# Patient Record
Sex: Female | Born: 1970 | Race: Black or African American | Hispanic: No | Marital: Single | Smoking: Never smoker
Health system: Southern US, Community
[De-identification: ages and names within clinical notes are randomized; demographics above are authoritative.]

## PROBLEM LIST (undated history)

## (undated) DIAGNOSIS — G709 Myoneural disorder, unspecified: Secondary | ICD-10-CM

## (undated) DIAGNOSIS — K760 Fatty (change of) liver, not elsewhere classified: Secondary | ICD-10-CM

## (undated) DIAGNOSIS — M25519 Pain in unspecified shoulder: Secondary | ICD-10-CM

## (undated) DIAGNOSIS — M797 Fibromyalgia: Secondary | ICD-10-CM

## (undated) DIAGNOSIS — R600 Localized edema: Secondary | ICD-10-CM

## (undated) DIAGNOSIS — M2550 Pain in unspecified joint: Secondary | ICD-10-CM

## (undated) DIAGNOSIS — G43909 Migraine, unspecified, not intractable, without status migrainosus: Secondary | ICD-10-CM

## (undated) DIAGNOSIS — M549 Dorsalgia, unspecified: Secondary | ICD-10-CM

## (undated) DIAGNOSIS — Z8669 Personal history of other diseases of the nervous system and sense organs: Secondary | ICD-10-CM

## (undated) DIAGNOSIS — M7500 Adhesive capsulitis of unspecified shoulder: Secondary | ICD-10-CM

## (undated) HISTORY — DX: Migraine, unspecified, not intractable, without status migrainosus: G43.909

## (undated) HISTORY — DX: Fibromyalgia: M79.7

## (undated) HISTORY — PX: LAPAROSCOPIC ENDOMETRIOSIS FULGURATION: SUR769

## (undated) HISTORY — DX: Pain in unspecified shoulder: M25.519

## (undated) HISTORY — PX: WISDOM TOOTH EXTRACTION: SHX21

## (undated) HISTORY — DX: Dorsalgia, unspecified: M54.9

## (undated) HISTORY — DX: Personal history of other diseases of the nervous system and sense organs: Z86.69

## (undated) HISTORY — PX: POLYPECTOMY: SHX149

## (undated) HISTORY — PX: ABLATION: SHX5711

## (undated) HISTORY — DX: Pain in unspecified joint: M25.50

## (undated) HISTORY — DX: Adhesive capsulitis of unspecified shoulder: M75.00

## (undated) HISTORY — DX: Myoneural disorder, unspecified: G70.9

## (undated) HISTORY — DX: Localized edema: R60.0

---

## 1999-03-28 ENCOUNTER — Other Ambulatory Visit: Admission: RE | Admit: 1999-03-28 | Discharge: 1999-03-28 | Payer: Self-pay | Admitting: *Deleted

## 2000-04-05 ENCOUNTER — Other Ambulatory Visit: Admission: RE | Admit: 2000-04-05 | Discharge: 2000-04-05 | Payer: Self-pay | Admitting: Family Medicine

## 2000-04-05 ENCOUNTER — Other Ambulatory Visit: Admission: RE | Admit: 2000-04-05 | Discharge: 2000-04-05 | Payer: Self-pay | Admitting: *Deleted

## 2001-04-07 ENCOUNTER — Other Ambulatory Visit: Admission: RE | Admit: 2001-04-07 | Discharge: 2001-04-07 | Payer: Self-pay | Admitting: Family Medicine

## 2001-11-20 ENCOUNTER — Other Ambulatory Visit: Admission: RE | Admit: 2001-11-20 | Discharge: 2001-11-20 | Payer: Self-pay | Admitting: Family Medicine

## 2006-03-21 ENCOUNTER — Ambulatory Visit (HOSPITAL_BASED_OUTPATIENT_CLINIC_OR_DEPARTMENT_OTHER): Admission: RE | Admit: 2006-03-21 | Discharge: 2006-03-21 | Payer: Self-pay | Admitting: General Surgery

## 2011-09-26 ENCOUNTER — Encounter (HOSPITAL_COMMUNITY): Payer: Self-pay | Admitting: *Deleted

## 2011-09-26 ENCOUNTER — Emergency Department (HOSPITAL_COMMUNITY): Payer: 59

## 2011-09-26 ENCOUNTER — Emergency Department (HOSPITAL_COMMUNITY)
Admission: EM | Admit: 2011-09-26 | Discharge: 2011-09-27 | Disposition: A | Discharge status: Home / Self Care | Payer: 59 | Attending: Emergency Medicine | Admitting: Emergency Medicine

## 2011-09-26 DIAGNOSIS — R0602 Shortness of breath: Secondary | ICD-10-CM | POA: Insufficient documentation

## 2011-09-26 DIAGNOSIS — R51 Headache: Secondary | ICD-10-CM | POA: Insufficient documentation

## 2011-09-26 DIAGNOSIS — R000 Tachycardia, unspecified: Secondary | ICD-10-CM | POA: Insufficient documentation

## 2011-09-26 DIAGNOSIS — E119 Type 2 diabetes mellitus without complications: Secondary | ICD-10-CM | POA: Insufficient documentation

## 2011-09-26 DIAGNOSIS — R110 Nausea: Secondary | ICD-10-CM | POA: Insufficient documentation

## 2011-09-26 DIAGNOSIS — R079 Chest pain, unspecified: Secondary | ICD-10-CM | POA: Insufficient documentation

## 2011-09-26 DIAGNOSIS — M546 Pain in thoracic spine: Secondary | ICD-10-CM | POA: Insufficient documentation

## 2011-09-26 DIAGNOSIS — R059 Cough, unspecified: Secondary | ICD-10-CM | POA: Insufficient documentation

## 2011-09-26 DIAGNOSIS — R5381 Other malaise: Secondary | ICD-10-CM | POA: Insufficient documentation

## 2011-09-26 DIAGNOSIS — Z79899 Other long term (current) drug therapy: Secondary | ICD-10-CM | POA: Insufficient documentation

## 2011-09-26 DIAGNOSIS — R05 Cough: Secondary | ICD-10-CM | POA: Insufficient documentation

## 2011-09-26 DIAGNOSIS — IMO0001 Reserved for inherently not codable concepts without codable children: Secondary | ICD-10-CM | POA: Insufficient documentation

## 2011-09-26 LAB — BASIC METABOLIC PANEL
BUN: 12 mg/dL (ref 6–23)
Calcium: 10 mg/dL (ref 8.4–10.5)
Creatinine, Ser: 0.48 mg/dL — ABNORMAL LOW (ref 0.50–1.10)
GFR calc non Af Amer: >90 mL/min (ref >90)
Glucose, Bld: 183 mg/dL — ABNORMAL HIGH (ref 70–99)
Sodium: 136 mEq/L (ref 135–145)

## 2011-09-26 LAB — DIFFERENTIAL
Eosinophils Absolute: 0.3 10*3/uL (ref 0.0–0.7)
Eosinophils Relative: 2 % (ref 0–5)
Lymphs Abs: 4.6 10*3/uL — ABNORMAL HIGH (ref 0.7–4.0)
Monocytes Absolute: 0.8 10*3/uL (ref 0.1–1.0)
Monocytes Relative: 5 % (ref 3–12)

## 2011-09-26 LAB — CBC
HCT: 32.8 % — ABNORMAL LOW (ref 36.0–46.0)
MCH: 21.9 pg — ABNORMAL LOW (ref 26.0–34.0)
MCV: 69.8 fL — ABNORMAL LOW (ref 78.0–100.0)
Platelets: 499 10*3/uL — ABNORMAL HIGH (ref 150–400)
RBC: 4.7 MIL/uL (ref 3.87–5.11)

## 2011-09-26 MED ORDER — ACETAMINOPHEN 325 MG PO TABS
975.0000 mg | ORAL_TABLET | Freq: Once | ORAL | Status: AC
  Administered 2011-09-27: 975 mg via ORAL
  Filled 2011-09-26: qty 3

## 2011-09-26 NOTE — ED Notes (Signed)
Pt stated that she has fatigue and weak since Friday. She stated that it became worse today. Today at work she begin having left sided CP around 2030. Pain is intermittent last 5-10 seconds. She has had a total of 5 episodes tonight. She also complaining of back pain with since CP. Pt stated that she has been having night sweats since Sunday. Pt has also been nauseated. No vomiting. No SOB. Will continue to monitor.

## 2011-09-26 NOTE — ED Notes (Signed)
Patient reports she has not felt good all day.  She denies any numbness. She has chest pain.  She has headache and states her hands feel numb.  Patient also reports she is having cough at night and night sweats.  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  She does not have results of her tests.  Mask placed on patient

## 2011-09-26 NOTE — ED Provider Notes (Signed)
Pt has had general malaise today.  She also has had chest pain lasting a few minutes at a time.  Not exertional.  No dyspnea on exertion either.  Denies leg pain or swelling.   Will check labs, cxr and reassess.  Low suspicion for ACE, PE, dissection.  I saw and evaluated the patient, reviewed the resident's note and I agree with the findings and plan.   Celene Kras, MD 09/26/11 [phone]

## 2011-09-26 NOTE — ED Provider Notes (Signed)
History     CSN: [id number]  Arrival date & time 09/26/11  2117   First MD Initiated Contact with Patient 09/26/11 2216      Chief Complaint  Patient presents with  . Chest Pain  . Weakness  . Night Sweats    (Consider location/radiation/quality/duration/timing/severity/associated sxs/prior treatment) Patient is a 41 y.o. female presenting with neurologic complaint and chest pain. The history is provided by the patient.  Neurologic Problem The primary symptoms include headaches and nausea. Primary symptoms do not include vomiting. Primary symptoms comment: C/C: General weakness The symptoms began 3 to 5 days ago. The symptoms are unchanged. The neurological symptoms are diffuse.  The headache is associated with weakness.  Additional symptoms include weakness. Additional symptoms do not include lower back pain. Medical issues also include diabetes. Medical issues do not include cerebral vascular accident, cancer, hypertension or recent surgery.  Chest Pain The chest pain began 6 - 12 hours ago. Chest pain occurs rarely. The chest pain is unchanged. Associated with: comes on at rest, goes away on its own. The pain is currently at 0/10. The severity of the pain is mild. The quality of the pain is described as sharp. The pain radiates to the upper back. Exacerbated by: nothing. Primary symptoms include shortness of breath, cough and nausea. Pertinent negatives for primary symptoms include no fatigue, no abdominal pain and no vomiting. Primary symptoms comment: C/C: General weakness  Associated symptoms include weakness. She tried nothing for the symptoms. Risk factors: DM.  Her past medical history is significant for diabetes.  Pertinent negatives for past medical history include no cancer, no hyperlipidemia, no hypertension, no MI and no PE.  Her family medical history is significant for CAD in family.     Past Medical History  Diagnosis Date  . Diabetes mellitus     Past Surgical  History  Procedure Date  . Cesarean section   . Laparoscopic endometriosis fulguration     No family history on file.  History  Substance Use Topics  . Smoking status: Never Smoker   . Smokeless tobacco: Not on file  . Alcohol Use: No    OB History    Grav Para Term Preterm Abortions TAB SAB Ect Mult Living                  Review of Systems  Constitutional: Negative for fatigue.  HENT: Negative for neck pain.   Respiratory: Positive for cough and shortness of breath. Negative for chest tightness.   Cardiovascular: Positive for chest pain.  Gastrointestinal: Positive for nausea. Negative for vomiting, abdominal pain and diarrhea.  Genitourinary: Negative for dysuria.  Musculoskeletal: Positive for myalgias.  Skin: Negative for rash.  Neurological: Positive for weakness and headaches.  All other systems reviewed and are negative.    Allergies  Review of patient's allergies indicates no known allergies.  Home Medications   Current Outpatient Rx  Name Route Sig Dispense Refill  . GLIPIZIDE 10 MG PO TABS Oral Take 10 mg by mouth 2 (two) times daily before a meal.    . METFORMIN HCL 1000 MG PO TABS Oral Take 1,000 mg by mouth 2 (two) times daily with a meal.    . ADULT MULTIVITAMIN W/MINERALS CH Oral Take 1 tablet by mouth daily.      BP 153/105  Pulse 113  Temp(Src) 97.3 F (36.3 C) (Oral)  Resp 18  Ht 5' 1.5" (1.562 m)  Wt 185 lb (83.915 kg)  BMI 34.39 kg/m2  SpO2 100%  Physical Exam  Nursing note and vitals reviewed. Constitutional: She is oriented to person, place, and time. She appears well-developed and well-nourished.  HENT:  Head: Normocephalic and atraumatic.  Eyes: EOM are normal. Pupils are equal, round, and reactive to light.  Neck: Normal range of motion.  Cardiovascular: Regular rhythm and normal heart sounds.  Tachycardia present.   Pulmonary/Chest: Effort normal and breath sounds normal. No respiratory distress. She has no wheezes.    Abdominal: Soft. She exhibits no distension. There is no tenderness. There is no guarding.  Musculoskeletal: Normal range of motion.       No LE swelling, TTP   Neurological: She is alert and oriented to person, place, and time. She has normal strength. No cranial nerve deficit or sensory deficit. She exhibits normal muscle tone. GCS eye subscore is 4. GCS verbal subscore is 5. GCS motor subscore is 6.  Skin: Skin is warm and dry.  Psychiatric: She has a normal mood and affect.    ED Course  Procedures (including critical care time)  Date: 09/27/2011  Rate: 110  Rhythm: sinus tachycardia  QRS Axis: right  Intervals: normal  ST/T Wave abnormalities: normal  Conduction Disutrbances:none  Narrative Interpretation:   Old EKG Reviewed: none available   Labs Reviewed  GLUCOSE, CAPILLARY - Abnormal; Notable for the following:    Glucose-Capillary 156 (*)    All other components within normal limits  CBC - Abnormal; Notable for the following:    WBC 15.9 (*)    Hemoglobin 10.3 (*)    HCT 32.8 (*)    MCV 69.8 (*)    MCH 21.9 (*)    RDW 17.3 (*)    Platelets 499 (*)    All other components within normal limits  DIFFERENTIAL - Abnormal; Notable for the following:    Neutro Abs 10.1 (*)    Lymphs Abs 4.6 (*)    All other components within normal limits  BASIC METABOLIC PANEL - Abnormal; Notable for the following:    Glucose, Bld 183 (*)    Creatinine, Ser 0.48 (*)    All other components within normal limits  POCT I-STAT TROPONIN I  URINALYSIS, ROUTINE W REFLEX MICROSCOPIC   Dg Chest 2 View  09/26/2011  *RADIOLOGY REPORT*  Clinical Data: Sudden onset of chest pain and weakness.  CHEST - 2 VIEW  Comparison: None.  Findings: The lungs are well-aerated and clear.  There is no evidence of focal opacification, pleural effusion or pneumothorax.  The heart is normal in size; the mediastinal contour is within normal limits.  No acute osseous abnormalities are seen.  IMPRESSION: No acute  cardiopulmonary process seen.  Original Report Authenticated By: Tonia Ghent, M.D.     1. Malaise       MDM   Patient presents with multiple complaints. Her biggest complaint is feeling of general malaise and generalized weakness for several days. He states she's been more fatigued than usual. She denies any focal complaints. She has had an occasional headache as well as generalized myalgias. She's had no fevers but has had some chills at night for several days. She's concerned about a possible exposure to his tuberculosis in the lab. She has since had her blood drawn by employee health to further evaluate for this. Denies any weight loss or hemoptysis. Documented fevers.  Patient also reports chest pain that began earlier this evening. She describes an atypical pain that last for seconds at a time. It as sharp on the  right side of her chest. She does note some radiation to her back with associated shortness of breath. She denies any vomiting. Denies any diaphoresis. Denies any pleuritic component to pain.  Patient is well-appearing. She is well-hydrated. She is laughing and jovial and interactive. Am and 9 without any clinical signs of DVT.   EKG does have sinus tachycardia. No hypoxia noted.  Chest x-ray benign. Initial labs unremarkable with exception of mild leukocytosis. Urine without infection. Given the patient's persistent mild tachycardia felt as though pulmonary was also considered in diagnosis. Will obtain a CT scan of the chest to further evaluate for this. Care transferred with the study pending. Patient safe to be discharged home should the study be negative for acute pathology.       Donnamarie Poag, MD 09/27/11 [phone]

## 2011-09-27 ENCOUNTER — Emergency Department (HOSPITAL_COMMUNITY)
Admission: EM | Admit: 2011-09-27 | Discharge: 2011-09-27 | Disposition: A | Discharge status: Home / Self Care | Payer: 59 | Source: Home / Self Care | Attending: Family Medicine | Admitting: Family Medicine

## 2011-09-27 ENCOUNTER — Other Ambulatory Visit: Payer: Self-pay

## 2011-09-27 ENCOUNTER — Emergency Department (HOSPITAL_COMMUNITY): Payer: 59

## 2011-09-27 ENCOUNTER — Encounter (HOSPITAL_COMMUNITY): Payer: Self-pay | Admitting: *Deleted

## 2011-09-27 ENCOUNTER — Encounter (HOSPITAL_COMMUNITY): Payer: Self-pay | Admitting: Radiology

## 2011-09-27 DIAGNOSIS — E119 Type 2 diabetes mellitus without complications: Secondary | ICD-10-CM

## 2011-09-27 DIAGNOSIS — E139 Other specified diabetes mellitus without complications: Secondary | ICD-10-CM

## 2011-09-27 DIAGNOSIS — R0789 Other chest pain: Secondary | ICD-10-CM

## 2011-09-27 LAB — POCT I-STAT, CHEM 8
Chloride: 102 mEq/L (ref 96–112)
Creatinine, Ser: 0.6 mg/dL (ref 0.50–1.10)
Glucose, Bld: 372 mg/dL — ABNORMAL HIGH (ref 70–99)
HCT: 39 % (ref 36.0–46.0)
Potassium: 4.1 mEq/L (ref 3.5–5.1)
Sodium: 136 mEq/L (ref 135–145)

## 2011-09-27 LAB — URINALYSIS, ROUTINE W REFLEX MICROSCOPIC
Bilirubin Urine: NEGATIVE
Glucose, UA: NEGATIVE mg/dL
Ketones, ur: NEGATIVE mg/dL
Specific Gravity, Urine: 1.02 (ref 1.005–1.030)
pH: 6.5 (ref 5.0–8.0)

## 2011-09-27 MED ORDER — IOHEXOL 350 MG/ML SOLN
100.0000 mL | Freq: Once | INTRAVENOUS | Status: AC | PRN
  Administered 2011-09-27: 100 mL via INTRAVENOUS

## 2011-09-27 MED ORDER — IBUPROFEN 800 MG PO TABS
800.0000 mg | ORAL_TABLET | Freq: Once | ORAL | Status: AC
  Administered 2011-09-27: 800 mg via ORAL

## 2011-09-27 MED ORDER — IBUPROFEN 800 MG PO TABS: 800.0000 mg | ORAL_TABLET | Freq: Three times a day (TID) | ORAL | Status: AC

## 2011-09-27 MED ORDER — IBUPROFEN 800 MG PO TABS
ORAL_TABLET | ORAL | Status: AC
  Filled 2011-09-27: qty 1

## 2011-09-27 NOTE — ED Provider Notes (Signed)
History     CSN: [id number]  Arrival date & time 09/27/11  1226   First MD Initiated Contact with Patient 09/27/11 1316      Chief Complaint  Patient presents with  . Chest Pain    (Consider location/radiation/quality/duration/timing/severity/associated sxs/prior treatment) Patient is a 41 y.o. female presenting with chest pain. The history is provided by the patient.  Chest Pain The chest pain began yesterday (seen last eve in ER for mult sx , which continue,  extensive w/u done.). The chest pain is unchanged. The severity of the pain is mild. The quality of the pain is described as sharp. The pain radiates to the mid back. Chest pain is worsened by certain positions. Primary symptoms include fatigue. Pertinent negatives for primary symptoms include no fever, no cough, no palpitations, no abdominal pain, no nausea, no vomiting and no dizziness.  Associated symptoms include weakness. Associated symptoms comments: Has not taken metformin this am b/o contrast use last eve..  Her past medical history is significant for diabetes.     Past Medical History  Diagnosis Date  . Diabetes mellitus     Past Surgical History  Procedure Date  . Cesarean section   . Laparoscopic endometriosis fulguration     History reviewed. No pertinent family history.  History  Substance Use Topics  . Smoking status: Never Smoker   . Smokeless tobacco: Not on file  . Alcohol Use: No    OB History    Grav Para Term Preterm Abortions TAB SAB Ect Mult Living                  Review of Systems  Constitutional: Positive for fatigue. Negative for fever.  Respiratory: Positive for chest tightness. Negative for cough.   Cardiovascular: Positive for chest pain. Negative for palpitations and leg swelling.  Gastrointestinal: Negative.  Negative for nausea, vomiting and abdominal pain.  Neurological: Positive for weakness. Negative for dizziness.    Allergies  Review of patient's allergies indicates  no known allergies.  Home Medications   Current Outpatient Rx  Name Route Sig Dispense Refill  . GLIPIZIDE 10 MG PO TABS Oral Take 10 mg by mouth 2 (two) times daily before a meal.    . IBUPROFEN 800 MG PO TABS Oral Take 1 tablet (800 mg total) by mouth 3 (three) times daily. 21 tablet 0  . METFORMIN HCL 1000 MG PO TABS Oral Take 1,000 mg by mouth 2 (two) times daily with a meal.    . ADULT MULTIVITAMIN W/MINERALS CH Oral Take 1 tablet by mouth daily.      BP 125/83  Pulse 93  Temp(Src) 98.1 F (36.7 C) (Oral)  Resp 20  SpO2 99%  LMP 09/10/2011  Physical Exam  Nursing note and vitals reviewed. Constitutional: She is oriented to person, place, and time. She appears well-developed and well-nourished.  HENT:  Head: Normocephalic.  Mouth/Throat: Oropharynx is clear and moist.  Eyes: Conjunctivae and EOM are normal. Pupils are equal, round, and reactive to light.  Neck: Normal range of motion. Neck supple.  Pulmonary/Chest: Effort normal and breath sounds normal. She exhibits tenderness.       Acute reproducible chest and mid back tenderness to palpation.  Abdominal: Soft. Bowel sounds are normal.  Musculoskeletal: She exhibits no edema.  Lymphadenopathy:    She has no cervical adenopathy.  Neurological: She is alert and oriented to person, place, and time.  Skin: Skin is warm and dry.    ED Course  Procedures (  including critical care time)  Labs Reviewed  GLUCOSE, CAPILLARY - Abnormal; Notable for the following:    Glucose-Capillary 422 (*)    All other components within normal limits  POCT I-STAT, CHEM 8 - Abnormal; Notable for the following:    Glucose, Bld 372 (*)    All other components within normal limits   Dg Chest 2 View  09/26/2011  *RADIOLOGY REPORT*  Clinical Data: Sudden onset of chest pain and weakness.  CHEST - 2 VIEW  Comparison: None.  Findings: The lungs are well-aerated and clear.  There is no evidence of focal opacification, pleural effusion or  pneumothorax.  The heart is normal in size; the mediastinal contour is within normal limits.  No acute osseous abnormalities are seen.  IMPRESSION: No acute cardiopulmonary process seen.  Original Report Authenticated By: Tonia Ghent, M.D.   Ct Angio Chest W/cm &/or Wo Cm  09/27/2011  *RADIOLOGY REPORT*  Clinical Data: Chest pain, headache and fatigue; cough. Diaphoresis.  Back pain.  Hand numbness.  CT ANGIOGRAPHY CHEST  Technique:  Multidetector CT imaging of the chest using the standard protocol during bolus administration of intravenous contrast. Multiplanar reconstructed images including MIPs were obtained and reviewed to evaluate the vascular anatomy.  Contrast: OMNIPAQUE IOHEXOL 350 MG/ML SOLN  Comparison: Chest radiograph performed 09/26/2011  Findings: There is no evidence of pulmonary embolus.  Minimal bilateral atelectasis is noted.  The lungs are otherwise clear.  There is no evidence of significant focal consolidation, pleural effusion or pneumothorax.  No masses are identified; no abnormal focal contrast enhancement is seen.  The mediastinum is unremarkable in appearance.  No mediastinal lymphadenopathy is seen.  No pericardial effusion is identified. The great vessels are unremarkable in appearance.  No axillary lymphadenopathy is seen.  The visualized portions of the thyroid gland are unremarkable in appearance.  The visualized portions of the liver and spleen are unremarkable.  No acute osseous abnormalities are seen.  IMPRESSION:  1.  No evidence of pulmonary embolus. 2.  Minimal bilateral atelectasis noted; lungs otherwise clear.  Original Report Authenticated By: Tonia Ghent, M.D.     1. 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

## 2011-09-27 NOTE — ED Notes (Signed)
Meal was given to pt. Okayed by Dr. Karma Ganja. Will continue to monitor.

## 2011-09-27 NOTE — Discharge Instructions (Signed)
Home to rest, take your diabetes medicine and ibuprofen with lots of fluids tonight, see your doctor if further problems.

## 2011-09-27 NOTE — ED Notes (Signed)
Pt  Reports  Symptoms  Of  Chest  Discomfort  Sweaty  And  Weak  -  Pt  Was  Seen last pm  In  Er  Had    EKG  And  Ct  Scan  AS  WELL  AS   CXR   -  SHE  WAS  RX      MOTRIN  AND  DIAGNOSED   WITH  MALAISE     SHE  DID  NOT  GET THE  RX  FILLED      SHE  SAYS  SHE  PRESENTED  TO  WORK  AND  WAS  TOLD  TO  GET  CHECKED          SHE  IS  AWAKE  AND  ALERT  SHE  STATES  HER PAIN  SCALE  IS  7

## 2011-09-27 NOTE — Discharge Instructions (Signed)
Follow instructions from radiology regarding holding your Metformin.   Return to the ED with any concerns including diffiuclty breathing, fainting, vomiting and not able to keep down liquids, decreased level of alertness/lethargy, or any other alarming symptoms

## 2011-10-11 ENCOUNTER — Ambulatory Visit (INDEPENDENT_AMBULATORY_CARE_PROVIDER_SITE_OTHER): Payer: 59 | Admitting: Obstetrics and Gynecology

## 2011-10-11 ENCOUNTER — Encounter: Payer: Self-pay | Admitting: Obstetrics and Gynecology

## 2011-10-11 VITALS — BP 120/70 | HR 80 | Ht 60.0 in | Wt 180.0 lb

## 2011-10-11 DIAGNOSIS — N946 Dysmenorrhea, unspecified: Secondary | ICD-10-CM

## 2011-10-11 DIAGNOSIS — Z124 Encounter for screening for malignant neoplasm of cervix: Secondary | ICD-10-CM

## 2011-10-11 DIAGNOSIS — Z113 Encounter for screening for infections with a predominantly sexual mode of transmission: Secondary | ICD-10-CM

## 2011-10-11 DIAGNOSIS — N920 Excessive and frequent menstruation with regular cycle: Secondary | ICD-10-CM | POA: Insufficient documentation

## 2011-10-11 LAB — HEPATITIS C ANTIBODY: HCV Ab: NEGATIVE

## 2011-10-11 LAB — HEPATITIS B SURFACE ANTIGEN: Hepatitis B Surface Ag: NEGATIVE

## 2011-10-11 MED ORDER — MEDROXYPROGESTERONE ACETATE 150 MG/ML IM SUSP: 150.0000 mg | INTRAMUSCULAR | Status: DC

## 2011-10-11 NOTE — Progress Notes (Signed)
Last Pap: 2011 per pt  WNL: Yes Regular Periods:yes Contraception: Abstinence   Monthly Breast exam:yes Tetanus<33yrs:yes Nl.Bladder Function:yes Daily BMs:yes Healthy Diet:no Calcium:yes Mammogram:yes 2011 per pt Exercise:yes occasionally  Seatbelt: yes Abuse at home: no Stressful work:yes Sigmoid-colonoscopy: n/a Bone Density: No Michelle Tanner Pt reports she has abnormal bleeding. heavier than period and passing clots  It has been going on for 6 months   Pt has dysmenorrhayes  Her pain is a 10/10.   She uses 1 of tampons or pads every 1 1/2 hours.  She has tried none.  It has not worked.  Pt denies chest pain or SOB Pt without complaints Physical Examination: General appearance - alert, well appearing, and in no distress Mental status - normal mood, behavior, speech, dress, motor activity, and thought processes Neck - supple, no significant adenopathy, thyroid exam: thyroid is normal in size without nodules or tenderness Chest - clear to auscultation, no wheezes, rales or rhonchi, symmetric air entry Heart - normal rate and regular rhythm Abdomen - soft, nontender, nondistended, no masses or organomegaly Breasts - breasts appear normal, no suspicious masses, no skin or nipple changes or axillary nodes Pelvic - normal external genitalia, vulva, vagina, cervix, uterus and adnexa Rectal - normal rectal, no masses, rectal exam not indicated Back exam - full range of motion, no tenderness, palpable spasm or pain on motion Neurological - alert, oriented, normal speech, no focal findings or movement disorder noted Musculoskeletal - no joint tenderness, deformity or swelling Extremities - no edema, redness or tenderness in the calves or thighs Skin - normal coloration and turgor, no rashes, no suspicious skin lesions noted Routine exam Pap sent yes Mammogram due yes will schedule pt desires Depoprovera RT for us/embx and depo provera

## 2011-10-11 NOTE — Patient Instructions (Signed)

## 2011-10-17 ENCOUNTER — Telehealth: Payer: Self-pay

## 2011-10-17 NOTE — Telephone Encounter (Signed)
Spoke with pt informed test results informed all test wnl except sv 1 and 2 pt has appt 10/22/11 to discuss with ND  Pt voice understanding

## 2011-10-17 NOTE — Telephone Encounter (Signed)
Message copied by Rolla Plate on Wed Oct 17, 2011  2:07 PM ------      Message from: Jaymes Graff      Created: Fri Oct 12, 2011 11:09 PM       Please make sure patient has an appointment to talk about her results.

## 2011-10-19 LAB — PAP IG, CT-NG, RFX HPV ASCU
Chlamydia Probe Amp: NEGATIVE
GC Probe Amp: NEGATIVE

## 2011-10-23 ENCOUNTER — Encounter (HOSPITAL_COMMUNITY): Payer: Self-pay | Admitting: Emergency Medicine

## 2011-10-23 ENCOUNTER — Emergency Department (HOSPITAL_COMMUNITY): Payer: 59

## 2011-10-23 ENCOUNTER — Emergency Department (HOSPITAL_COMMUNITY)
Admission: EM | Admit: 2011-10-23 | Discharge: 2011-10-23 | Disposition: A | Discharge status: Nursing Home (Includes ICF) | Payer: 59 | Source: Home / Self Care | Attending: Emergency Medicine | Admitting: Emergency Medicine

## 2011-10-23 ENCOUNTER — Emergency Department (HOSPITAL_COMMUNITY)
Admission: EM | Admit: 2011-10-23 | Discharge: 2011-10-23 | Disposition: A | Discharge status: Home / Self Care | Payer: 59 | Attending: Emergency Medicine | Admitting: Emergency Medicine

## 2011-10-23 DIAGNOSIS — R209 Unspecified disturbances of skin sensation: Secondary | ICD-10-CM | POA: Insufficient documentation

## 2011-10-23 DIAGNOSIS — R202 Paresthesia of skin: Secondary | ICD-10-CM

## 2011-10-23 DIAGNOSIS — R200 Anesthesia of skin: Secondary | ICD-10-CM

## 2011-10-23 DIAGNOSIS — G43109 Migraine with aura, not intractable, without status migrainosus: Secondary | ICD-10-CM | POA: Insufficient documentation

## 2011-10-23 LAB — CBC
Hemoglobin: 10.5 g/dL — ABNORMAL LOW (ref 12.0–15.0)
MCH: 22.2 pg — ABNORMAL LOW (ref 26.0–34.0)
MCV: 71.7 fL — ABNORMAL LOW (ref 78.0–100.0)
Platelets: 471 10*3/uL — ABNORMAL HIGH (ref 150–400)
RBC: 4.74 MIL/uL (ref 3.87–5.11)
WBC: 12.5 10*3/uL — ABNORMAL HIGH (ref 4.0–10.5)

## 2011-10-23 LAB — POCT I-STAT, CHEM 8
BUN: 5 mg/dL — ABNORMAL LOW (ref 6–23)
Chloride: 105 mEq/L (ref 96–112)
Glucose, Bld: 212 mg/dL — ABNORMAL HIGH (ref 70–99)
HCT: 37 % (ref 36.0–46.0)
Potassium: 3.9 mEq/L (ref 3.5–5.1)

## 2011-10-23 LAB — DIFFERENTIAL
Basophils Relative: 1 % (ref 0–1)
Eosinophils Relative: 3 % (ref 0–5)
Lymphs Abs: 3.1 10*3/uL (ref 0.7–4.0)
Monocytes Absolute: 0.6 10*3/uL (ref 0.1–1.0)
Monocytes Relative: 5 % (ref 3–12)
Smear Review: INCREASED

## 2011-10-23 LAB — GLUCOSE, CAPILLARY: Glucose-Capillary: 208 mg/dL — ABNORMAL HIGH (ref 70–99)

## 2011-10-23 MED ORDER — DEXAMETHASONE SODIUM PHOSPHATE 10 MG/ML IJ SOLN
10.0000 mg | Freq: Once | INTRAMUSCULAR | Status: AC
  Administered 2011-10-23: 10 mg via INTRAVENOUS
  Filled 2011-10-23: qty 1

## 2011-10-23 MED ORDER — DIPHENHYDRAMINE HCL 50 MG/ML IJ SOLN
12.5000 mg | Freq: Once | INTRAMUSCULAR | Status: AC
  Administered 2011-10-23: 12.5 mg via INTRAVENOUS
  Filled 2011-10-23: qty 1

## 2011-10-23 MED ORDER — METOCLOPRAMIDE HCL 5 MG/ML IJ SOLN
5.0000 mg | Freq: Once | INTRAMUSCULAR | Status: AC
  Administered 2011-10-23: 5 mg via INTRAVENOUS
  Filled 2011-10-23: qty 2

## 2011-10-23 MED ORDER — SODIUM CHLORIDE 0.9 % IV BOLUS (SEPSIS)
1000.0000 mL | INTRAVENOUS | Status: AC
  Administered 2011-10-23: 1000 mL via INTRAVENOUS

## 2011-10-23 NOTE — Discharge Instructions (Signed)
Migraine Headache A migraine headache is an intense, throbbing pain on one or both sides of your head. The exact cause of a migraine headache is not always known. A migraine may be caused when nerves in the brain become irritated and release chemicals that cause swelling within blood vessels, causing pain. Many migraine sufferers have a family history of migraines. Before you get a migraine you may or may not get an aura. An aura is a group of symptoms that can predict the beginning of a migraine. An aura may include:  Visual changes such as:   Flashing lights.   Bright spots or zig-zag lines.   Tunnel vision.   Feelings of numbness.   Trouble talking.   Muscle weakness.  SYMPTOMS  Pain on one or both sides of your head.   Pain that is pulsating or throbbing in nature.   Pain that is severe enough to prevent daily activities.   Pain that is aggravated by any daily physical activity.   Nausea (feeling sick to your stomach), vomiting, or both.   Pain with exposure to bright lights, loud noises, or activity.   General sensitivity to bright lights or loud noises.  MIGRAINE TRIGGERS Examples of triggers of migraine headaches include:   Alcohol.   Smoking.   Stress.   It may be related to menses (female menstruation).   Aged cheeses.   Foods or drinks that contain nitrates, glutamate, aspartame, or tyramine.   Lack of sleep.   Chocolate.   Caffeine.   Hunger.   Medications such as nitroglycerine (used to treat chest pain), birth control pills, estrogen, and some blood pressure medications.  DIAGNOSIS  A migraine headache is often diagnosed based on:  Symptoms.   Physical examination.   A computerized X-ray scan (computed tomography, CT) of your head.  TREATMENT  Medications can help prevent migraines if they are recurrent or should they become recurrent. Your caregiver can help you with a medication or treatment program that will be helpful to you.   Lying  down in a dark, quiet room may be helpful.   Keeping a headache diary may help you find a trend as to what may be triggering your headaches.  SEEK IMMEDIATE MEDICAL CARE IF:   You have confusion, personality changes or seizures.   You have headaches that wake you from sleep.   You have an increased frequency in your headaches.   You have a stiff neck.   You have a loss of vision.   You have muscle weakness.   You start losing your balance or have trouble walking.   You feel faint or pass out.  MAKE SURE YOU:   Understand these instructions.   Will watch your condition.   Will get help right away if you are not doing well or get worse.  Document Released: 05/07/2005 Document Revised: 04/26/2011 Document Reviewed: 12/21/2008 [facility] Patient Information 2012 [place], [place].  RESOURCE GUIDE  Chronic Pain Problems: Contact [facility]  [phone] Patients need to be referred by their primary care doctor.  Insufficient Money for Medicine: Contact United Way:  call "211" or [facility] [phone].  No Primary Care Doctor: - Call [facility] Connect  [phone] - can help you locate a primary care doctor that  accepts your insurance, provides certain services, etc. - Physician Referral Service- [phone]  Agencies that provide inexpensive medical care: - [facility]  [phone] - [facility]  [phone] - [facility]  [phone] [facility]  [phone] - [facility] Parenthood  [phone] [facility]  [phone]  Medicaid-accepting [facility] Providers: - [facility]- [address]  [phone], Mon-Fri 9am-7pm, Sat 9am-1pm - [facility]- [address] [place]  [phone] - [facility]- [address] [place]  [phone] [facility]- [address]  [phone] - Renaye Rakers-  [address] [place], [address], [phone]  Only accepts [place] patients after they have their name  applied to their card  Self Pay (no insurance) in [place]: - Sickle Cell Patients: Dr Willey Blade, [facility]  [address] [place], [phone] - [facility]- [address] [place]  [phone]       [facility] [place]- [address]       -     [facility]- see information above (Speak to [employer] if you do not have insurance)       -  [facility]- [address] [place], [phone]       -  [facility]- [address],  [phone]       -  [facility]- [address] [phone]       -  Dr Julio Sicks-  [address], [place], [phone]       -  [facility]- [address], [phone]       -  [facility]- [address] [phone], also [address] [phone]       -    [facility]- [address] [place], [phone], 1st & 3rd Saturday   every month, 10am-1pm  1) Find a Doctor and Pay Out of Pocket Although you won't have to find out who is covered by your insurance plan, it is a good idea to ask around and get recommendations. You will then need to call the office and see if the doctor you have chosen will accept you as a new patient and what types of options they offer for patients who are self-pay. Some doctors offer discounts or will set up payment plans for their patients who do not have insurance, but you will need to ask so you aren't surprised when you get to your appointment.  2) Contact Your Local Health Department Not all health departments have doctors that can see patients for sick visits, but many do, so it is worth a call to see if yours does. If you don't know where your local health department is, you can check in your phone book. The CDC also has a tool to help you locate your state's health department, and many  state websites also have listings of all of their local health departments.  3) Find a Walk-in Clinic If your illness is not likely to be very severe or complicated, you may want to try a walk in clinic. These are popping up all over the country in pharmacies, drugstores, and shopping centers. They're usually staffed by nurse practitioners or physician assistants that have been trained to treat common illnesses and complaints. They're usually fairly quick and inexpensive. However, if you have serious medical issues or chronic medical problems, these are probably not your best option  STD Testing - [facility]  Department of [facility], STD Clinic, [address] [place], phone [phone] or [phone].  Monday - Friday, call for an appointment. [facility] Department of [employer], STD Clinic, [address], [place], phone [phone] or [phone].  Monday - Friday, call for an appointment.  Abuse/Neglect: [facility] Child Abuse Hotline 239-859-7081 [facility] Child Abuse Hotline 551-365-4541 (After Hours)  Emergency Shelter:  [facility] (234)269-0475  Maternity Homes: - [facility] 743-314-5468 - [facility] 2100937748  MRSA Hotline #:   (872) 306-8882  [facility] Resources  [facility]  United Way [facility] Dept. [address]                 [address]         [address] [place] [address]  Blondell Reveal Phone:  [phone]                                  Phone:  [phone]                   Phone:  [phone]  [facility], [phone] - [phone]       -     [facility] in [place], [address]                                   (249)[phone], [facility] Child Abuse Hotline (517)284-0024 or (581)128-1652 (After Hours)   Behavioral Health Services  Substance Abuse Resources: - [facility]  610-120-8197 - [facility] 718-365-2595 - [facility] [place] [phone] Floydene Flock (504) [phone] - Residential & Outpatient Substance Abuse Program  [phone]  Psychological Services: [facility]  6625404335 Services  [phone] - [facility], [phone] [place]. [address], [place], ACCESS LINE: [phone] or [phone], [email or web address]  Dental Assistance  If unable to pay or uninsured, contact:  [facility] or [facility] to become qualified for the adult dental clinic.  Patients with Medicaid: [facility] [phone] [address], [phone] [address] [phone]  If unable to pay, or uninsured, contact [facility] (321)[phone]) or [facility] Department [phone] in [place], [phone] in [facility]) to become qualified for the adult dental clinic  Other Low-Cost Community Dental Services: - [facility]- [address] [place], [place], [place], [address], [phone], [address]. [phone], 2nd and 4th Thursday of the month at 6:30am.  10 clients each day by appointment, can sometimes see walk-in patients if someone does not show for an appointment. [facility]- [address] [place], [place], [address], [phone] - [facility]- [address], [place], [place],  [address], [phone] [facility] Department- [phone] [facility] Department- [phone] [facility] Department- [phone]

## 2011-10-23 NOTE — ED Notes (Signed)
Patient woke up today 0630 with left hand numbness went back to sleep woke up at 1000 numbness left hand and left forearm.  Showered went to work then went to urgent care sent to ED for evaluation while at urgent care developed left thigh numbness. Ax4 answering and following commands appropriate. States started new diabetic medications two weeks ago.

## 2011-10-23 NOTE — ED Provider Notes (Signed)
History     CSN: [id number]  Arrival date & time 10/23/11  1358   First MD Initiated Contact with Patient 10/23/11 1619      Chief Complaint  Patient presents with  . Numbness    (Consider location/radiation/quality/duration/timing/severity/associated sxs/prior treatment) Patient is a 41 y.o. female presenting with neurologic complaint. The history is provided by the patient.  Neurologic Problem Primary symptoms do not include headaches, dizziness, fever, nausea or vomiting. Primary symptoms comment: numbness The symptoms began 6 to 12 hours ago. The symptoms are waxing and waning. The neurological symptoms are multifocal. Context: upon awakening.  Additional symptoms do not include neck stiffness, weakness, pain, loss of balance, anxiety or dysphoric mood.    Past Medical History  Diagnosis Date  . Diabetes mellitus   . Hx of migraine headaches     Past Surgical History  Procedure Date  . Cesarean section   . Laparoscopic endometriosis fulguration   . Wisdom tooth extraction     Family History  Problem Relation Age of Onset  . Cancer Paternal Grandmother   . Diabetes Father   . Kidney disease Father   . Cancer Mother   . Cancer Paternal Uncle   . Cancer Paternal Aunt     History  Substance Use Topics  . Smoking status: Never Smoker   . Smokeless tobacco: Not on file  . Alcohol Use: Yes     occasionally     OB History    Grav Para Term Preterm Abortions TAB SAB Ect Mult Living   4    3     1       Review of Systems  Constitutional: Negative for fever and fatigue.  HENT: Negative for congestion, drooling, neck pain and neck stiffness.   Eyes: Negative for pain.  Respiratory: Negative for cough and shortness of breath.   Cardiovascular: Negative for chest pain.  Gastrointestinal: Negative for nausea, vomiting, abdominal pain and diarrhea.  Genitourinary: Negative for dysuria and hematuria.  Musculoskeletal: Negative for back pain and gait problem.  Skin:  Negative for color change.  Neurological: Negative for dizziness, weakness, headaches and loss of balance.  Hematological: Negative for adenopathy.  Psychiatric/Behavioral: Negative for behavioral problems and dysphoric mood.  All other systems reviewed and are negative.    Allergies  Review of patient's allergies indicates no known allergies.  Home Medications   Current Outpatient Rx  Name Route Sig Dispense Refill  . BC HEADACHE POWDER PO Oral Take 1 packet by mouth 2 (two) times daily as needed. For pain    . IBUPROFEN 800 MG PO TABS Oral Take 800 mg by mouth every 8 (eight) hours as needed. For pain    . METFORMIN HCL 1000 MG PO TABS Oral Take 1,000 mg by mouth 2 (two) times daily with a meal.    . ADULT MULTIVITAMIN W/MINERALS CH Oral Take 1 tablet by mouth daily.    Marland Kitchen NAPROXEN SODIUM 220 MG PO TABS Oral Take 440-660 mg by mouth 2 (two) times daily as needed. For pain    . SITAGLIPTIN PHOSPHATE 100 MG PO TABS Oral Take 100 mg by mouth daily.      BP 135/94  Pulse 92  Temp(Src) 98.3 F (36.8 C) (Oral)  Resp 16  SpO2 100%  LMP 10/05/2011  Physical Exam  Nursing note and vitals reviewed. Constitutional: She is oriented to person, place, and time. She appears well-developed and well-nourished.  HENT:  Head: Normocephalic.  Mouth/Throat: No oropharyngeal exudate.  Eyes: Conjunctivae  and EOM are normal. Pupils are equal, round, and reactive to light.  Neck: Normal range of motion. Neck supple.  Cardiovascular: Normal rate, regular rhythm, normal heart sounds and intact distal pulses.  Exam reveals no gallop and no friction rub.   No murmur heard. Pulmonary/Chest: Effort normal and breath sounds normal. No respiratory distress. She has no wheezes.  Abdominal: Soft. Bowel sounds are normal. There is no tenderness. There is no rebound and no guarding.  Musculoskeletal: Normal range of motion. She exhibits no edema and no tenderness.  Neurological: She is alert and oriented to  person, place, and time. No cranial nerve deficit or sensory deficit. Coordination and gait normal.       Normal strength bilaterally. Normal bilaterally on exam.   Skin: Skin is warm and dry.  Psychiatric: She has a normal mood and affect. Her behavior is normal.    ED Course  Procedures (including critical care time)  Labs Reviewed  CBC - Abnormal; Notable for the following:    WBC 12.5 (*)    Hemoglobin 10.5 (*)    HCT 34.0 (*)    MCV 71.7 (*)    MCH 22.2 (*)    RDW 17.4 (*)    Platelets 471 (*)    All other components within normal limits  DIFFERENTIAL - Abnormal; Notable for the following:    Neutro Abs 8.3 (*)    All other components within normal limits  GLUCOSE, CAPILLARY - Abnormal; Notable for the following:    Glucose-Capillary 208 (*)    All other components within normal limits  POCT I-STAT, CHEM 8 - Abnormal; Notable for the following:    BUN 5 (*)    Glucose, Bld 212 (*)    All other components within normal limits   Ct Head Wo Contrast  10/23/2011  *RADIOLOGY REPORT*  Clinical Data: Left side numbness beginning this morning.  CT HEAD WITHOUT CONTRAST  Technique:  Contiguous axial images were obtained from the base of the skull through the vertex without contrast.  Comparison: None.  Findings: There is no evidence of acute intracranial abnormality including infarction, hemorrhage, mass lesion, mass effect, midline shift or abnormal extra-axial fluid collection.  No pneumocephalus or hydrocephalus.  Calvarium intact.  IMPRESSION: Negative exam.  Original Report Authenticated By: Bernadene Bell. Maricela Curet, M.D.     No diagnosis found.    MDM  4:46 PM 41 y.o. female pw left sided numbness that has waxed and waned throughout the day. Pt notes mild numbness has spread from her left hand at 6am this morning to her shoulder and now her left leg. Pt otherwise neurologically intact, and AFVSS here. Pt seen at urgent care w/ possibly weaker grip on left side. Pt has normal  strength here. Pt notes she has had a HA x 3 days, now pain is 6/10. CT head neg, labs non-contrib. Low suspicion for CVA, will tx HA as sx may be assoc with the HA.   6:40 PM: After HA cocktail pt now denies HA and no longer has numbness. Suspect her sx to be assoc w/ the HA. Continued low suspicion for CVA/TIA event.  I have discussed the diagnosis/risks/treatment options with the patient and believe the pt to be eligible for discharge home to follow-up with her pcp in the next 1-2 weeks to discuss further brain imaging and possible neurologic referral. We also discussed returning to the ED immediately if new or worsening sx occur. We discussed the sx which are most concerning (e.g., weakness,  numbness, worsening HA) that necessitate immediate return. Any new prescriptions provided to the patient are listed below.  New Prescriptions   No medications on file   Clinical Impression 1. Complicated migraine          Purvis Sheffield, MD 10/24/11 [phone]

## 2011-10-23 NOTE — ED Provider Notes (Signed)
History     CSN: [id number]  Arrival date & time 10/23/11  1211   First MD Initiated Contact with Patient 10/23/11 1246      Chief Complaint  Patient presents with  . Arm Pain    (Consider location/radiation/quality/duration/timing/severity/associated sxs/prior treatment) HPI Comments: Patient presents urgent care and she reports being sent by her supervisor as she was having left upper and hand numbness this started around 6:30 AM this morning. She adds that she has had a headache for the last 3 days. In during my initial interview describes that during the waiting period she has felt her left  leg is also feeling unusual as" is it is getting swollen ". She denies any visual changes, vomiting or feeling nauseous, and does not feel any weakness. No speech problems.  Patient denies any injury or trauma to her left upper arm or lower leg no head injuries or trauma, no fevers or constitutional symptoms.   Patient is a 41 y.o. female presenting with arm pain. The history is provided by the patient.  Arm Pain This is a recurrent problem. The current episode started more than 2 days ago. The problem occurs constantly. Associated symptoms include headaches. Pertinent negatives include no chest pain and no shortness of breath. The symptoms are aggravated by nothing. The symptoms are relieved by nothing.    Past Medical History  Diagnosis Date  . Diabetes mellitus   . Hx of migraine headaches     Past Surgical History  Procedure Date  . Cesarean section   . Laparoscopic endometriosis fulguration   . Wisdom tooth extraction     Family History  Problem Relation Age of Onset  . Cancer Paternal Grandmother   . Diabetes Father   . Kidney disease Father   . Cancer Mother   . Cancer Paternal Uncle   . Cancer Paternal Aunt     History  Substance Use Topics  . Smoking status: Never Smoker   . Smokeless tobacco: Not on file  . Alcohol Use: Yes     occasionally     OB History    Grav Para Term Preterm Abortions TAB SAB Ect Mult Living   4    3     1       Review of Systems  Constitutional: Negative for fever, diaphoresis, activity change and appetite change.  Respiratory: Negative for shortness of breath.   Cardiovascular: Negative for chest pain, palpitations and leg swelling.  Neurological: Positive for numbness and headaches. Negative for dizziness, facial asymmetry and weakness.    Allergies  Review of patient's allergies indicates no known allergies.  Home Medications   Current Outpatient Rx  Name Route Sig Dispense Refill  . GLIPIZIDE 10 MG PO TABS Oral Take 10 mg by mouth 2 (two) times daily before a meal.    . MEDROXYPROGESTERONE ACETATE 150 MG/ML IM SUSP Intramuscular Inject 1 mL (150 mg total) into the muscle every 3 (three) months. 1 mL 4  . METFORMIN HCL 1000 MG PO TABS Oral Take 1,000 mg by mouth 2 (two) times daily with a meal.    . ADULT MULTIVITAMIN W/MINERALS CH Oral Take 1 tablet by mouth daily.    Marland Kitchen SITAGLIPTIN PHOSPHATE 100 MG PO TABS Oral Take 100 mg by mouth daily.      BP 135/87  Pulse 100  Temp(Src) 98.6 F (37 C) (Oral)  Resp 18  SpO2 100%  LMP 10/05/2011  Physical Exam  Constitutional: She is oriented to person, place, and  time. Vital signs are normal. She appears well-developed and well-nourished.  Non-toxic appearance. She does not have a sickly appearance. No distress.  HENT:  Head: Normocephalic.  Eyes: Conjunctivae and EOM are normal. Pupils are equal, round, and reactive to light. No scleral icterus.  Neck: Neck supple.  Cardiovascular: Normal rate, regular rhythm, normal heart sounds and normal pulses.   Musculoskeletal: Normal range of motion.  Neurological: She is alert and oriented to person, place, and time. She has normal strength. She displays no atrophy and no tremor. No cranial nerve deficit or sensory deficit. She exhibits normal muscle tone. She displays no seizure activity. Coordination normal.        Patient can grip exhibited a week or grip on her left hand. But is able to pronate her forearm muscle tone feels normal. Quadriceps extension and muscle tone seemed to be normal on her left lower extremity. No facial paralysis or speech problems were noted during exam. Patient is smiling and jovial. On active range of motion and left quadricep extension seem to be weaker than the right side  Skin: No rash noted.    ED Course  Procedures (including critical care time)  Labs Reviewed - No data to display No results found.   1. Paresthesias       MDM  Patient presents urgent care sent by her supervisors for left upper arm numbness and headache that first occurred around 6:30 AM this morning. We'll transfer patient to the emergency department although my clinical suspicion at this point that she is experiencing an acute CVA is low still needs to be considered based on her symptoms. Patient is jovial and comfortable, but symptomatic.        Jimmie Molly, MD 10/23/11 1356

## 2011-10-23 NOTE — ED Notes (Addendum)
Patient reports left hand numb when she woke at 6:30 am.  Noticed arm numbness around 7:00 am.  Over the course of the morning, no improvement in numbness, no worsening of symptoms.  Reports entire arm is numb.  Started a new exercise routine last week:treadmill, stair-stepper.reports headache for 3 days.  Reports she usually does not have headaches.  Head pain in back of eyes, top of head and back of head, no nausea, no vomiting.  Denies vision changes.  Denies chest pain or weakness in extremities.  No neck pain, patient is a phlebotomist, patient is right handed

## 2011-10-23 NOTE — ED Notes (Signed)
cbg 208 

## 2011-10-23 NOTE — ED Notes (Signed)
MD at bedside. 

## 2011-10-24 ENCOUNTER — Encounter: Payer: Self-pay | Admitting: Obstetrics and Gynecology

## 2011-10-24 ENCOUNTER — Ambulatory Visit (INDEPENDENT_AMBULATORY_CARE_PROVIDER_SITE_OTHER): Payer: 59 | Admitting: Obstetrics and Gynecology

## 2011-10-24 VITALS — BP 118/72 | Ht 60.0 in | Wt 180.0 lb

## 2011-10-24 DIAGNOSIS — E119 Type 2 diabetes mellitus without complications: Secondary | ICD-10-CM

## 2011-10-24 DIAGNOSIS — R531 Weakness: Secondary | ICD-10-CM

## 2011-10-24 DIAGNOSIS — B009 Herpesviral infection, unspecified: Secondary | ICD-10-CM | POA: Insufficient documentation

## 2011-10-24 DIAGNOSIS — M6281 Muscle weakness (generalized): Secondary | ICD-10-CM

## 2011-10-24 DIAGNOSIS — N920 Excessive and frequent menstruation with regular cycle: Secondary | ICD-10-CM

## 2011-10-24 NOTE — Progress Notes (Signed)
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 Pt also states she went to the hospital last night for numbness in left hand, arm, and leg.  BP 118/72  Ht 5' (1.524 m)  Wt 180 lb (81.647 kg)  BMI 35.15 kg/m2  LMP 10/05/2011 Results for orders placed during the hospital encounter of 10/23/11  CBC      Component Value Range   WBC 12.5 (*) 4.0 - 10.5 (K/uL)   RBC 4.74  3.87 - 5.11 (MIL/uL)   Hemoglobin 10.5 (*) 12.0 - 15.0 (g/dL)   HCT 16.1 (*) 09.6 - 46.0 (%)   MCV 71.7 (*) 78.0 - 100.0 (fL)   MCH 22.2 (*) 26.0 - 34.0 (pg)   MCHC 30.9  30.0 - 36.0 (g/dL)   RDW 04.5 (*) 40.9 - 15.5 (%)   Platelets 471 (*) 150 - 400 (K/uL)  DIFFERENTIAL      Component Value Range   Neutrophils Relative 66  43 - 77 (%)   Lymphocytes Relative 25  12 - 46 (%)   Monocytes Relative 5  3 - 12 (%)   Eosinophils Relative 3  0 - 5 (%)   Basophils Relative 1  0 - 1 (%)   Neutro Abs 8.3 (*) 1.7 - 7.7 (K/uL)   Lymphs Abs 3.1  0.7 - 4.0 (K/uL)   Monocytes Absolute 0.6  0.1 - 1.0 (K/uL)   Eosinophils Absolute 0.4  0.0 - 0.7 (K/uL)   Basophils Absolute 0.1  0.0 - 0.1 (K/uL)   Smear Review PLATELETS APPEAR INCREASED    GLUCOSE, CAPILLARY      Component Value Range   Glucose-Capillary 208 (*) 70 - 99 (mg/dL)  POCT I-STAT, CHEM 8      Component Value Range   Sodium 140  135 - 145 (mEq/L)   Potassium 3.9  3.5 - 5.1 (mEq/L)   Chloride 105  96 - 112 (mEq/L)   BUN 5 (*) 6 - 23 (mg/dL)   Creatinine, Ser 8.11  0.50 - 1.10 (mg/dL)   Glucose, Bld 914 (*) 70 - 99 (mg/dL)   Calcium, Ion 7.82  9.56 - 1.32 (mmol/L)   TCO2 24  0 - 100 (mmol/L)   Hemoglobin 12.6  12.0 - 15.0 (g/dL)   HCT 21.3  08.6 - 57.8 (%)  positive HSV1 and 2 Left sided weakness DM menorrhagia 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  Will give rx for valtrex Refer to neurology and endocrinology Us/embx/shg/depo provera@[facility] 

## 2011-10-24 NOTE — ED Provider Notes (Signed)
I saw and evaluated the patient, reviewed the resident's note and I agree with the findings and plan. Patient with headache and numbness over the left side. Nonfocal exam. Negative head CT discussed worrisome symptoms the patient. Patient will followup for TIA/CVA.possibility of complex migraine with the episode of a headache. She also had her headache improved and the numbness resolved.   Juliet Rude. Rubin Payor, MD 10/24/11 [phone]

## 2011-10-25 NOTE — Progress Notes (Signed)
Referral to Dr.Kerr for diabetes management all record faxed they will contact pt with appt, referral to [facility] all records faxed they will contact pt with appt

## 2011-10-31 ENCOUNTER — Other Ambulatory Visit: Payer: Self-pay

## 2011-10-31 DIAGNOSIS — Z1231 Encounter for screening mammogram for malignant neoplasm of breast: Secondary | ICD-10-CM

## 2011-12-06 ENCOUNTER — Ambulatory Visit (HOSPITAL_COMMUNITY)
Admission: RE | Admit: 2011-12-06 | Discharge: 2011-12-06 | Disposition: A | Discharge status: Home / Self Care | Payer: 59 | Source: Ambulatory Visit | Attending: Obstetrics and Gynecology | Admitting: Obstetrics and Gynecology

## 2011-12-06 ENCOUNTER — Ambulatory Visit (HOSPITAL_COMMUNITY): Payer: 59

## 2011-12-06 DIAGNOSIS — Z1231 Encounter for screening mammogram for malignant neoplasm of breast: Secondary | ICD-10-CM | POA: Insufficient documentation

## 2011-12-14 ENCOUNTER — Encounter: Payer: Self-pay | Admitting: Obstetrics and Gynecology

## 2011-12-28 ENCOUNTER — Telehealth: Payer: Self-pay | Admitting: Obstetrics and Gynecology

## 2011-12-28 NOTE — Telephone Encounter (Signed)
Spoke ith pt rgd msg pt states started cycle 12/27/11 need to sch sonohystergram enod biopsy advised pt scheduler out of office today but will call when shes back in office to schd appt pt voice understanding

## 2012-03-04 ENCOUNTER — Encounter: Payer: Self-pay | Admitting: Obstetrics and Gynecology

## 2012-03-04 ENCOUNTER — Other Ambulatory Visit: Payer: Self-pay | Admitting: Obstetrics and Gynecology

## 2012-03-04 ENCOUNTER — Other Ambulatory Visit: Payer: 59

## 2012-03-04 ENCOUNTER — Ambulatory Visit (INDEPENDENT_AMBULATORY_CARE_PROVIDER_SITE_OTHER): Payer: 59

## 2012-03-04 ENCOUNTER — Ambulatory Visit (INDEPENDENT_AMBULATORY_CARE_PROVIDER_SITE_OTHER): Payer: 59 | Admitting: Obstetrics and Gynecology

## 2012-03-04 VITALS — BP 118/74 | Resp 14 | Ht 61.0 in | Wt 177.0 lb

## 2012-03-04 DIAGNOSIS — N920 Excessive and frequent menstruation with regular cycle: Secondary | ICD-10-CM

## 2012-03-04 DIAGNOSIS — Z3009 Encounter for other general counseling and advice on contraception: Secondary | ICD-10-CM

## 2012-03-04 DIAGNOSIS — N926 Irregular menstruation, unspecified: Secondary | ICD-10-CM

## 2012-03-04 LAB — POCT URINE PREGNANCY: Preg Test, Ur: NEGATIVE

## 2012-03-04 MED ORDER — MEDROXYPROGESTERONE ACETATE 150 MG/ML IM SUSP
150.0000 mg | Freq: Once | INTRAMUSCULAR | Status: AC
  Administered 2012-03-04: 150 mg via INTRAMUSCULAR

## 2012-03-04 NOTE — Progress Notes (Signed)
Pt without c/o BP 118/74  Resp 14  Ht 5\' 1"  (1.549 m)  Wt 177 lb (80.287 kg)  BMI 33.44 kg/m2 SHG/EMBX:  The patient was consented for both procedures.  She was placed in dorsal lithotomy position and speculum placed in the vagina.  The cervix was cleansed with three betadine swabs.  The endometrial pipet was placed in the the endometrial cavity through the cervix.  The uterus did sound to 7cm.  The pipet was removed and specimen was sent to pathology.  The sonohysterography catheter was then placed through the cervix and vaginal probe placed back in the vagina. [place] 7.41cm by 5.15 cm.  Normal ovaries bilaterally SHG WNL Pt desires observation May have depoprovera today Rt for AEX

## 2012-03-04 NOTE — Progress Notes (Unsigned)
DEpo provera given without difficulty. Next dose  Due 05/26/12

## 2012-03-12 ENCOUNTER — Telehealth: Payer: Self-pay

## 2012-03-12 NOTE — Telephone Encounter (Signed)
Message copied by Rolla Plate on Wed Mar 12, 2012  8:47 AM ------      Message from: Jaymes Graff      Created: Tue Mar 11, 2012 10:01 PM       Please call the patient and let her know her endometrial biopsy is normal

## 2012-03-12 NOTE — Telephone Encounter (Signed)
Spoke with pt rgd labs informed endo biopsy wnl pt voice understanding 

## 2012-03-12 NOTE — Telephone Encounter (Signed)
Lm on vm tcb rgd labs 

## 2012-05-26 ENCOUNTER — Other Ambulatory Visit (INDEPENDENT_AMBULATORY_CARE_PROVIDER_SITE_OTHER): Payer: 59

## 2012-05-26 DIAGNOSIS — Z3009 Encounter for other general counseling and advice on contraception: Secondary | ICD-10-CM

## 2012-05-26 MED ORDER — MEDROXYPROGESTERONE ACETATE 150 MG/ML IM SUSP
150.0000 mg | Freq: Once | INTRAMUSCULAR | Status: AC
  Administered 2012-05-26: 150 mg via INTRAMUSCULAR

## 2012-05-26 NOTE — Progress Notes (Unsigned)
Next Depo due 08-17-2012

## 2012-06-18 ENCOUNTER — Telehealth: Payer: Self-pay | Admitting: Obstetrics and Gynecology

## 2012-06-18 NOTE — Telephone Encounter (Signed)
Called pt and offered appt for tomorrow at 09:15 with AVS. Pt accepted appt. Melody Comas A

## 2012-06-19 ENCOUNTER — Ambulatory Visit: Payer: 59 | Admitting: Obstetrics and Gynecology

## 2012-06-19 ENCOUNTER — Encounter: Payer: Self-pay | Admitting: Obstetrics and Gynecology

## 2012-06-19 VITALS — BP 110/72 | Wt 184.0 lb

## 2012-06-19 DIAGNOSIS — E119 Type 2 diabetes mellitus without complications: Secondary | ICD-10-CM

## 2012-06-19 DIAGNOSIS — R81 Glycosuria: Secondary | ICD-10-CM

## 2012-06-19 DIAGNOSIS — R102 Pelvic and perineal pain: Secondary | ICD-10-CM

## 2012-06-19 DIAGNOSIS — N898 Other specified noninflammatory disorders of vagina: Secondary | ICD-10-CM

## 2012-06-19 LAB — GLUCOSE, POCT (MANUAL RESULT ENTRY): POC Glucose: 369 mg/dl — AB (ref 70–99)

## 2012-06-19 LAB — POCT URINALYSIS DIPSTICK
Leukocytes, UA: NEGATIVE
Protein, UA: NEGATIVE
Urobilinogen, UA: NEGATIVE

## 2012-06-19 MED ORDER — LIDOCAINE 5 % EX OINT: TOPICAL_OINTMENT | CUTANEOUS | Status: DC | PRN

## 2012-06-19 MED ORDER — FLUCONAZOLE 150 MG PO TABS: ORAL_TABLET | ORAL | Status: DC

## 2012-06-19 MED ORDER — NYSTATIN-TRIAMCINOLONE 100000-0.1 UNIT/GM-% EX CREA: TOPICAL_CREAM | Freq: Four times a day (QID) | CUTANEOUS | Status: DC

## 2012-06-19 NOTE — Progress Notes (Signed)
HISTORY OF PRESENT ILLNESS  [name] is a 42 y.o. year old female,[id number], who presents for a problem visit. The patient has a known history of diabetes.  Subjective:  The patient complains of severe pain at her vulva.  She complains of discomfort when she voids.  Objective:  BP 110/72  Wt 184 lb (83.462 kg)   General: mild distress Resp: clear to auscultation bilaterally Cardio: regular rate and rhythm, S1, S2 normal, no murmur, click, rub or gallop GI: soft and nontender  External genitalia: red and slightly edematous Vaginal: tender to exam Cervix: normal appearance Adnexa: normal bimanual exam Uterus: upper limits normal size  Wet prep: Yeast  Urinalysis: Negative except for 3+ glucose  Blood sugar: 389  Assessment:  Vulvar pain  Yeast vulvitis/vaginitis  Diabetes  Obesity  Plan:  Diflucan 150 mg tablets, one tablet daily for 3 days  Mycolog-II cream to the vulva  Lidocaine ointment 5% as needed  The patient will see her family physician soon to manage her diabetes that is clearly out-of-control  Return to the office in 2 weeks to see Dr. Normand Tanner or call as needed.  Michelle Tanner M.D.  06/19/2012 2:25 PM    Color: Brown/Clear Odor: yes Itching:yes Thin:no Thick:yes Fever:no Dyspareunia:no Hx PID:no HX STD:no Pelvic Pain:no Desires Gc/CT:no Desires HIV,RPR,HbsAG:no

## 2012-06-20 LAB — GC/CHLAMYDIA PROBE AMP: CT Probe RNA: NEGATIVE

## 2012-07-04 ENCOUNTER — Encounter: Payer: 59 | Admitting: Obstetrics and Gynecology

## 2014-03-22 ENCOUNTER — Encounter: Payer: Self-pay | Admitting: Obstetrics and Gynecology

## 2015-04-27 ENCOUNTER — Inpatient Hospital Stay: Payer: 59 | Admitting: Internal Medicine

## 2015-05-03 ENCOUNTER — Encounter: Payer: Self-pay | Admitting: Internal Medicine

## 2015-05-03 ENCOUNTER — Inpatient Hospital Stay: Payer: 59 | Attending: Internal Medicine | Admitting: Internal Medicine

## 2015-05-03 ENCOUNTER — Inpatient Hospital Stay: Payer: 59

## 2015-05-03 VITALS — HR 104 | Temp 97.0°F | Resp 18 | Ht 61.0 in | Wt 190.5 lb

## 2015-05-03 DIAGNOSIS — D72829 Elevated white blood cell count, unspecified: Secondary | ICD-10-CM | POA: Diagnosis not present

## 2015-05-03 DIAGNOSIS — Z809 Family history of malignant neoplasm, unspecified: Secondary | ICD-10-CM | POA: Diagnosis not present

## 2015-05-03 DIAGNOSIS — E669 Obesity, unspecified: Secondary | ICD-10-CM | POA: Diagnosis not present

## 2015-05-03 DIAGNOSIS — E119 Type 2 diabetes mellitus without complications: Secondary | ICD-10-CM | POA: Insufficient documentation

## 2015-05-03 DIAGNOSIS — Z79899 Other long term (current) drug therapy: Secondary | ICD-10-CM | POA: Diagnosis not present

## 2015-05-03 DIAGNOSIS — R718 Other abnormality of red blood cells: Secondary | ICD-10-CM | POA: Insufficient documentation

## 2015-05-03 DIAGNOSIS — Z7984 Long term (current) use of oral hypoglycemic drugs: Secondary | ICD-10-CM | POA: Insufficient documentation

## 2015-05-03 DIAGNOSIS — N939 Abnormal uterine and vaginal bleeding, unspecified: Secondary | ICD-10-CM | POA: Diagnosis not present

## 2015-05-03 DIAGNOSIS — Z8669 Personal history of other diseases of the nervous system and sense organs: Secondary | ICD-10-CM | POA: Diagnosis not present

## 2015-05-03 DIAGNOSIS — D473 Essential (hemorrhagic) thrombocythemia: Secondary | ICD-10-CM | POA: Insufficient documentation

## 2015-05-03 DIAGNOSIS — K625 Hemorrhage of anus and rectum: Secondary | ICD-10-CM | POA: Diagnosis not present

## 2015-05-03 LAB — CBC WITH DIFFERENTIAL/PLATELET
Basophils Absolute: 0.2 10*3/uL — ABNORMAL HIGH (ref 0–0.1)
Basophils Relative: 1 %
EOS ABS: 0.5 10*3/uL (ref 0–0.7)
EOS PCT: 4 %
HCT: 39.2 % (ref 35.0–47.0)
HEMOGLOBIN: 12.1 g/dL (ref 12.0–16.0)
LYMPHS ABS: 2.7 10*3/uL (ref 1.0–3.6)
Lymphocytes Relative: 20 %
MCH: 22.2 pg — AB (ref 26.0–34.0)
MCHC: 30.8 g/dL — AB (ref 32.0–36.0)
MCV: 72.2 fL — ABNORMAL LOW (ref 80.0–100.0)
MONO ABS: 0.7 10*3/uL (ref 0.2–0.9)
MONOS PCT: 6 %
NEUTROS PCT: 69 %
Neutro Abs: 9.2 10*3/uL — ABNORMAL HIGH (ref 1.4–6.5)
Platelets: 498 10*3/uL — ABNORMAL HIGH (ref 150–440)
RBC: 5.43 MIL/uL — ABNORMAL HIGH (ref 3.80–5.20)
RDW: 17.7 % — AB (ref 11.5–14.5)
WBC: 13.2 10*3/uL — ABNORMAL HIGH (ref 3.6–11.0)

## 2015-05-03 LAB — SEDIMENTATION RATE: Sed Rate: 42 mm/hr — ABNORMAL HIGH (ref 0–20)

## 2015-05-03 LAB — FERRITIN: FERRITIN: 15 ng/mL (ref 11–307)

## 2015-05-03 NOTE — Progress Notes (Signed)
Pt been tired over 1 month, worse in last 2 weeks.  No fevers, illness. Has frozen shoulder that she got injection for and it did not help. She sleeps good but does not feel rested the next day. She works 3 rd shift at [employer]. Has noticed blood on toilet paper

## 2015-05-03 NOTE — Progress Notes (Signed)
[place] @ [facility] [phone]  Fax:([phone]) [place] OB: May 14, 1971  MR#: [id number]  XLK#:[id number]  Patient Care Team: Provider Default, MD as PCP - General (Orthopedic Surgery)  CHIEF COMPLAINT:  Chief Complaint  Patient presents with  . elevated wbc     No history exists.    Oncology Flowsheet 10/23/2011  dexamethasone (DECADRON) IV 10 mg  metoCLOPramide (REGLAN) IV 5 mg    HISTORY OF PRESENT ILLNESS:   Miss Michelle Tanner was referred to our clinic for evaluation of elevated white blood cell count and elevated platelet count. She learned about this abnormality is very recently, and is not aware of previously abnormal blood counts. She claims to be feeling somewhat tired, but able to perform all her activities of daily living and work-related activities. She works night shifts at [employer]. She denies fevers, chills, weight loss, nausea, vomiting, diarrhea, constipation, chest pain shortness of breath. She claims to have occasional bright red blood while wiping after bowel movement. She also has had vaginal bleeding with clots over the past 2 days. She uses Depo-Provera and usually does not have menstrual bleeding.  REVIEW OF SYSTEMS:   Review of Systems  All other systems reviewed and are negative.    PAST MEDICAL HISTORY: Past Medical History  Diagnosis Date  . Diabetes mellitus   . Hx of migraine headaches     PAST SURGICAL HISTORY: Past Surgical History  Procedure Laterality Date  . Cesarean section    . Laparoscopic endometriosis fulguration    . Wisdom tooth extraction      FAMILY HISTORY Family History  Problem Relation Age of Onset  . Cancer Paternal Grandmother   . Diabetes Father   . Kidney disease Father   . Cancer Mother   . Cancer Paternal Uncle   . Cancer Paternal Aunt     ADVANCED DIRECTIVES:  No flowsheet data found.  HEALTH MAINTENANCE: Social History  Substance Use Topics  . Smoking status:  Never Smoker   . Smokeless tobacco: Not on file  . Alcohol Use: Yes     Comment: occasionally      No Known Allergies  Current Outpatient Prescriptions  Medication Sig Dispense Refill  . Aspirin-Salicylamide-Caffeine (BC HEADACHE POWDER PO) Take 1 packet by mouth 2 (two) times daily as needed. For pain    . fluconazole (DIFLUCAN) 150 MG tablet 1 po each day for 3 days 3 tablet 1  . ibuprofen (ADVIL,MOTRIN) 800 MG tablet Take 800 mg by mouth every 8 (eight) hours as needed. For pain    . lidocaine (XYLOCAINE) 5 % ointment Apply topically as needed. 35.44 g 0  . metFORMIN (GLUCOPHAGE) 1000 MG tablet Take 1,000 mg by mouth 2 (two) times daily with a meal.    . Multiple Vitamin (MULITIVITAMIN WITH MINERALS) TABS Take 1 tablet by mouth daily.    . naproxen sodium (ANAPROX) 220 MG tablet Take 440-660 mg by mouth 2 (two) times daily as needed. For pain    . nystatin-triamcinolone (MYCOLOG II) cream Apply topically 4 (four) times daily. 30 g 0  . pioglitazone (ACTOS) 15 MG tablet Take 15 mg by mouth daily.    . sitaGLIPtin (JANUVIA) 100 MG tablet Take 100 mg by mouth daily.    . SUMAtriptan (IMITREX) 50 MG tablet Take 50 mg by mouth every 2 (two) hours as needed.    . Vitamin D, Ergocalciferol, (DRISDOL) 50000 UNITS CAPS Take 50,000 Units by mouth.  No current facility-administered medications for this visit.    OBJECTIVE:  Filed Vitals:   05/03/15 1548  Pulse: 104  Temp: 97 F (36.1 C)  Resp: 18     There is no weight on file to calculate BMI.    ECOG FS:1 - Symptomatic but completely ambulatory  Physical Exam  Constitutional: She is oriented to person, place, and time and well-developed, well-nourished, and in no distress. No distress.  Body mass index is 36.01 kg/(m^2). Morbidly obese African-American female  HENT:  Head: Normocephalic and atraumatic.  Right Ear: External ear normal.  Left Ear: External ear normal.  Mouth/Throat: Oropharynx is clear and moist.  Eyes:  Conjunctivae are normal. Pupils are equal, round, and reactive to light. Right eye exhibits no discharge. Left eye exhibits no discharge. No scleral icterus.  Neck: Normal range of motion. Neck supple. No JVD present. No tracheal deviation present. No thyromegaly present.  Cardiovascular: Normal rate, regular rhythm, normal heart sounds and intact distal pulses.  Exam reveals no gallop and no friction rub.   No murmur heard. Pulmonary/Chest: Effort normal and breath sounds normal. No stridor. No respiratory distress. She has no wheezes. She has no rales. She exhibits no tenderness.  Abdominal: Soft. Bowel sounds are normal. She exhibits no distension and no mass. There is no tenderness. There is no rebound and no guarding.  Exam is suboptimal due to body habitus  Genitourinary:  Postponed  Musculoskeletal: Normal range of motion. She exhibits no edema or tenderness.  Lymphadenopathy:    She has no cervical adenopathy.  Neurological: She is alert and oriented to person, place, and time. She has normal reflexes. No cranial nerve deficit. She exhibits normal muscle tone. Gait normal. Coordination normal. GCS score is 15.  Skin: Skin is warm. No rash noted. She is not diaphoretic. No erythema. No pallor.  Psychiatric: Mood, memory, affect and judgment normal.  Nursing note and vitals reviewed.    LAB RESULTS:  Recent Results (from the past 2160 hour(s))  CBC with Differential/Platelet     Status: Abnormal   Collection Time: 05/03/15  4:27 PM  Result Value Ref Range   WBC 13.2 (H) 3.6 - 11.0 K/uL   RBC 5.43 (H) 3.80 - 5.20 MIL/uL   Hemoglobin 12.1 12.0 - 16.0 g/dL   HCT 39.2 35.0 - 47.0 %   MCV 72.2 (L) 80.0 - 100.0 fL   MCH 22.2 (L) 26.0 - 34.0 pg   MCHC 30.8 (L) 32.0 - 36.0 g/dL   RDW 17.7 (H) 11.5 - 14.5 %   Platelets 498 (H) 150 - 440 K/uL   Neutrophils Relative % 69 %   Neutro Abs 9.2 (H) 1.4 - 6.5 K/uL   Lymphocytes Relative 20 %   Lymphs Abs 2.7 1.0 - 3.6 K/uL   Monocytes  Relative 6 %   Monocytes Absolute 0.7 0.2 - 0.9 K/uL   Eosinophils Relative 4 %   Eosinophils Absolute 0.5 0 - 0.7 K/uL   Basophils Relative 1 %   Basophils Absolute 0.2 (H) 0 - 0.1 K/uL          STUDIES: No results found.  ASSESSMENT and MEDICAL DECISION MAKING:  Neutrophilic leukocytosis, thrombocytosis-we will work the patient up for CML, as well as other forms of myeloproliferative neoplasms, along with causes of secondary thrombocytosis, such as inflammatory conditions. We will check CBC, ESR, CRP, leukocyte alkaline phosphatase, BCR abl, [id number], calreticulin and MPL mutations. We also will check iron stores through measuring ferritin level. She should  continue with iron supplementation at this point.  Microcytosis-could be either secondary to iron deficiency or hemoglobinopathy. We do not have previous blood work results to compare to estimate the duration of this might just. We will check ferritin, and decide on whether she should have hemoglobin electrophoresis.  Patient expressed understanding and was in agreement with this plan. She also understands that She can call clinic at any time with any questions, concerns, or complaints.    No matching staging information was found for the patient.  Roxana Hires, MD   05/03/2015 3:41 PM

## 2015-05-04 LAB — LEUKOCYTE ALKALINE PHOSPHATASE: Leukocyte Alkaline  Phos Stain: 85 (ref 25–130)

## 2015-05-04 LAB — C-REACTIVE PROTEIN: CRP: 3 mg/dL — AB (ref <1.0)

## 2015-05-13 LAB — BCR-ABL1, CML/ALL, PCR, QUANT

## 2015-05-13 LAB — JAK2 V617F, W REFLEX TO CALR/E12/MPL

## 2015-05-13 LAB — CALR + JAK2 E12-15 + MPL (REFLEXED)

## 2015-05-20 ENCOUNTER — Emergency Department (HOSPITAL_COMMUNITY)
Admission: EM | Admit: 2015-05-20 | Discharge: 2015-05-20 | Disposition: A | Discharge status: Home / Self Care | Payer: 59 | Source: Home / Self Care

## 2015-05-20 ENCOUNTER — Encounter (HOSPITAL_COMMUNITY): Payer: Self-pay | Admitting: Emergency Medicine

## 2015-05-20 DIAGNOSIS — S9031XA Contusion of right foot, initial encounter: Secondary | ICD-10-CM | POA: Diagnosis not present

## 2015-05-20 NOTE — ED Provider Notes (Signed)
CSN: [id number]     Arrival date & time 05/20/15  1652 History   None    Chief Complaint  Patient presents with  . Foot Injury   (Consider location/radiation/quality/duration/timing/severity/associated sxs/prior Treatment) HPI History obtained from patient:   LOCATION: right foot SEVERITY:3 DURATION:today CONTEXT: sudden bruise noted on right foot QUALITY: MODIFYING FACTORS:no treatment ASSOCIATED SYMPTOMS:none TIMING:since this morning OCCUPATION:  Past Medical History  Diagnosis Date  . Diabetes mellitus   . Hx of migraine headaches   . Neuromuscular disorder ([place])     diabetic neuropathy  . Migraines   . Frozen shoulder     left   Past Surgical History  Procedure Laterality Date  . Cesarean section    . Laparoscopic endometriosis fulguration    . Wisdom tooth extraction     Family History  Problem Relation Age of Onset  . Rectal cancer Paternal Grandmother   . Diabetes Father   . Kidney disease Father   . CAD Father   . Cancer Mother   . Cancer Paternal Uncle   . Cancer Paternal Aunt    Social History  Substance Use Topics  . Smoking status: Never Smoker   . Smokeless tobacco: Never Used  . Alcohol Use: Yes     Comment: occasionally  about twice a year   OB History    Gravida Para Term Preterm AB TAB SAB Ectopic Multiple Living   4    3     1      Review of Systems ROS +'ve foot bruise  Denies: HEADACHE, NAUSEA, ABDOMINAL PAIN, CHEST PAIN, CONGESTION, DYSURIA, SHORTNESS OF BREATH  Allergies  Latex  Home Medications   Prior to Admission medications   Medication Sig Start Date End Date Taking? Authorizing Provider  Aspirin-Salicylamide-Caffeine (BC HEADACHE POWDER PO) Take 1 packet by mouth 2 (two) times daily as needed. For pain    Historical Provider, MD  dapagliflozin propanediol (FARXIGA) 10 MG TABS tablet Take 10 mg by mouth daily.    Historical Provider, MD  diclofenac (VOLTAREN) 75 MG EC tablet Take 75 mg by mouth 2 (two) times daily as  needed.    Historical Provider, MD  eszopiclone (LUNESTA) 1 MG TABS tablet Take 3 mg by mouth at bedtime as needed for sleep. Take immediately before bedtime    Historical Provider, MD  FeFum-FePoly-FA-B Cmp-C-Biot (FOLIVANE-PLUS PO) Take 1 capsule by mouth daily.    Historical Provider, MD  fluconazole (DIFLUCAN) 150 MG tablet 1 po each day for 3 days 06/19/12   Ena Dawley, MD  gabapentin (NEURONTIN) 600 MG tablet Take 600 mg by mouth 2 (two) times daily.    Historical Provider, MD  ibuprofen (ADVIL,MOTRIN) 800 MG tablet Take 800 mg by mouth every 8 (eight) hours as needed. For pain    Historical Provider, MD  insulin detemir (LEVEMIR) 100 UNIT/ML injection Inject 48 Units into the skin at bedtime.    Historical Provider, MD  lidocaine (XYLOCAINE) 5 % ointment Apply topically as needed. 06/19/12   Ena Dawley, MD  Liraglutide -Weight Management (SAXENDA) 18 MG/3ML SOPN Inject 5 mg into the skin daily.    Historical Provider, MD  metFORMIN (GLUCOPHAGE) 1000 MG tablet Take 1,000 mg by mouth 2 (two) times daily with a meal.    Historical Provider, MD  Multiple Vitamin (MULITIVITAMIN WITH MINERALS) TABS Take 1 tablet by mouth daily.    Historical Provider, MD  naproxen sodium (ANAPROX) 220 MG tablet Take 440-660 mg by mouth 2 (two) times daily as needed.  For pain    Historical Provider, MD  nystatin-triamcinolone (MYCOLOG II) cream Apply topically 4 (four) times daily. 06/19/12   Ena Dawley, MD  pioglitazone (ACTOS) 15 MG tablet Take 15 mg by mouth daily.    Historical Provider, MD  SUMAtriptan (IMITREX) 50 MG tablet Take 50 mg by mouth every 2 (two) hours as needed.    Historical Provider, MD  Vitamin D, Ergocalciferol, (DRISDOL) 50000 UNITS CAPS Take 50,000 Units by mouth.    Historical Provider, MD   Meds Ordered and Administered this Visit  Medications - No data to display  BP 120/89 mmHg  Pulse 114  Temp(Src) 97.9 F (36.6 C) (Oral)  Resp 18  SpO2 97% No data  found.   Physical Exam  Musculoskeletal: She exhibits tenderness.       Feet:  Nursing note and vitals reviewed.   ED Course  Procedures (including critical care time)  Labs Review Labs Reviewed - No data to display  Imaging Review No results found.   Visual Acuity Review  Right Eye Distance:   Left Eye Distance:   Bilateral Distance:    Right Eye Near:   Left Eye Near:    Bilateral Near:         MDM   1. Bruised sole of foot, right, initial encounter    Reassured that very little chance of this being "blood clot" the bruise is palpable and superficial.  Suggest OP symptomatic treatment.  Follow up with PCP if there are new or worsening of symptoms.     Konrad Felix, [place] 05/20/15 [phone]

## 2015-05-20 NOTE — ED Notes (Signed)
The patient presented to the [facility] with a complaint of a "sore" to her right foot. The patient stated that she did not injure the foot and the sore started today.

## 2015-05-20 NOTE — Discharge Instructions (Signed)
Contusion A contusion is a deep bruise. Contusions happen when an injury causes bleeding under the skin. Symptoms of bruising include pain, swelling, and discolored skin. The skin may turn blue, purple, or yellow. HOME CARE   Rest the injured area.  If told, put ice on the injured area.  Put ice in a plastic bag.  Place a towel between your skin and the bag.  Leave the ice on for 20 minutes, 2-3 times per day.  If told, put light pressure (compression) on the injured area using an elastic bandage. Make sure the bandage is not too tight. Remove it and put it back on as told by your doctor.  If possible, raise (elevate) the injured area above the level of your heart while you are sitting or lying down.  Take over-the-counter and prescription medicines only as told by your doctor. GET HELP IF:  Your symptoms do not get better after several days of treatment.  Your symptoms get worse.  You have trouble moving the injured area. GET HELP RIGHT AWAY IF:   You have very bad pain.  You have a loss of feeling (numbness) in a hand or foot.  Your hand or foot turns pale or cold.   This information is not intended to replace advice given to you by your health care provider. Make sure you discuss any questions you have with your health care provider.   Document Released: 10/24/2007 Document Revised: 01/26/2015 Document Reviewed: 09/22/2014 Elsevier Interactive Patient Education 2016 [place] Contusion  A foot contusion is a deep bruise to the foot. Contusions happen when an injury causes bleeding under the skin. Signs of bruising include pain, puffiness (swelling), and discolored skin. The contusion may turn blue, purple, or yellow. HOME CARE  Put ice on the injured area.  Put ice in a plastic bag.  Place a towel between your skin and the bag.  Leave the ice on for 15-20 minutes, 03-04 times a day.  Only take medicines as told by your doctor.  Use an elastic wrap  only as told. You may remove the wrap for sleeping, showering, and bathing. Take the wrap off if you lose feeling (numb) in your toes, or they turn blue or cold. Put the wrap on more loosely.  Keep the foot raised (elevated) with pillows.  If your foot hurts, avoid standing or walking.  When your doctor says it is okay to use your foot, start using it slowly. If you have pain, lessen how much you use your foot.  See your doctor as told. GET HELP RIGHT AWAY IF:   You have more redness, puffiness, or pain in your foot.  Your puffiness or pain does not get better with medicine.  You lose feeling in your foot, or you cannot move your toes.  Your foot turns cold or blue.  You have pain when you move your toes.  Your foot feels warm.  Your contusion does not get better in 2 days. MAKE SURE YOU:   Understand these instructions.  Will watch this condition.  Will get help right away if you or your child is not doing well or gets worse.   This information is not intended to replace advice given to you by your health care provider. Make sure you discuss any questions you have with your health care provider.   Document Released: 02/14/2008 Document Revised: 11/06/2011 Document Reviewed: 01/11/2015 Elsevier Interactive Patient Education [employer].

## 2015-05-25 ENCOUNTER — Inpatient Hospital Stay: Payer: 59 | Attending: Internal Medicine

## 2015-05-26 MED FILL — PIOGLITAZONE HCL 15 MG TAB: 15 | 30 days supply | Qty: 30 | Fill #1

## 2015-05-26 MED FILL — MELOXICAM 7.5 MG TABLET: 7.5 | 30 days supply | Qty: 30 | Fill #1

## 2015-05-26 MED FILL — FARXIGA 10 MG TABLET: 10 | 30 days supply | Qty: 30 | Fill #3

## 2015-05-26 MED FILL — UNIFINE PENTIPS 8MM 31G: 31G X 8 MM | 90 days supply | Qty: 100 | Fill #2

## 2015-06-03 MED FILL — FLUCONAZOLE 150 MG TABLET: 150 | 1 days supply | Qty: 1 | Fill #3

## 2015-06-15 MED FILL — metFORMIN HCL 1000 MG TABS: 1000 | 30 days supply | Qty: 60 | Fill #5

## 2015-06-15 MED FILL — FOLIVANE-PLUS CAPSULE: 90 days supply | Qty: 90 | Fill #0

## 2015-06-17 MED FILL — GABAPENTIN 600 MG TABLET: 600 | 30 days supply | Qty: 60 | Fill #0

## 2015-06-24 MED FILL — FARXIGA 10 MG TABLET: 10 | 30 days supply | Qty: 30 | Fill #4

## 2015-06-27 MED FILL — MELOXICAM 7.5 MG TABLET: 7.5 | 30 days supply | Qty: 30 | Fill #2

## 2015-06-28 MED FILL — LEVEMIR FLEXTOUCH 100 UNITS: 100 | 30 days supply | Qty: 15 | Fill #0

## 2015-07-04 MED FILL — PIOGLITAZONE HCL 15 MG TAB: 15 | 30 days supply | Qty: 30 | Fill #0

## 2015-07-28 MED FILL — FARXIGA 10 MG TABLET: 10 | 30 days supply | Qty: 30 | Fill #5

## 2015-07-28 MED FILL — GABAPENTIN 600 MG TABLET: 600 | 30 days supply | Qty: 60 | Fill #1

## 2015-08-02 DIAGNOSIS — Z3040 Encounter for surveillance of contraceptives, unspecified: Secondary | ICD-10-CM | POA: Diagnosis not present

## 2015-08-02 MED FILL — MELOXICAM 7.5 MG TABLET: 7.5 | 30 days supply | Qty: 30 | Fill #3

## 2015-08-02 MED FILL — MEDROXYPROG 150 MG/ML SYR: 150 | 84 days supply | Qty: 1 | Fill #1

## 2015-08-02 MED FILL — PIOGLITAZONE HCL 15 MG TAB: 15 | 30 days supply | Qty: 30 | Fill #1

## 2015-08-02 MED FILL — LEVEMIR FLEXTOUCH 100 UNITS: 100 | 30 days supply | Qty: 15 | Fill #1

## 2015-08-03 MED FILL — metFORMIN HCL 1000 MG TABS: 1000 | 30 days supply | Qty: 60 | Fill #0

## 2015-09-01 MED FILL — MELOXICAM 7.5 MG TABLET: 7.5 | 30 days supply | Qty: 30 | Fill #0

## 2015-09-05 MED FILL — PIOGLITAZONE HCL 15 MG TAB: 15 | 30 days supply | Qty: 30 | Fill #0

## 2015-09-06 MED FILL — GABAPENTIN 600 MG TABLET: 600 | 30 days supply | Qty: 60 | Fill #2

## 2015-09-06 MED FILL — metFORMIN HCL 1000 MG TABS: 1000 | 30 days supply | Qty: 60 | Fill #1

## 2015-09-08 MED FILL — FARXIGA 10 MG TABLET: 10 | 30 days supply | Qty: 30 | Fill #0

## 2015-09-16 DIAGNOSIS — Z713 Dietary counseling and surveillance: Secondary | ICD-10-CM | POA: Diagnosis not present

## 2015-09-16 DIAGNOSIS — Z79899 Other long term (current) drug therapy: Secondary | ICD-10-CM | POA: Diagnosis not present

## 2015-09-16 DIAGNOSIS — E1140 Type 2 diabetes mellitus with diabetic neuropathy, unspecified: Secondary | ICD-10-CM | POA: Diagnosis not present

## 2015-09-16 MED FILL — LEVEMIR FLEXTOUCH 100 UNITS: 100 | 30 days supply | Qty: 15 | Fill #0

## 2015-10-11 MED FILL — GABAPENTIN 600 MG TABLET: 600 | 30 days supply | Qty: 60 | Fill #0

## 2015-10-11 MED FILL — metFORMIN HCL 1000 MG TABS: 1000 | 30 days supply | Qty: 60 | Fill #2

## 2015-10-11 MED FILL — PIOGLITAZONE HCL 15 MG TAB: 15 | 30 days supply | Qty: 30 | Fill #0

## 2015-10-11 MED FILL — MELOXICAM 7.5 MG TABLET: 7.5 | 30 days supply | Qty: 30 | Fill #0

## 2015-10-14 MED FILL — LEVEMIR FLEXTOUCH 100 UNITS: 100 | 30 days supply | Qty: 15 | Fill #1

## 2015-10-14 MED FILL — FARXIGA 10 MG TABLET: 10 | 30 days supply | Qty: 30 | Fill #0

## 2015-11-10 MED FILL — FARXIGA 10 MG TABLET: 10 | 30 days supply | Qty: 30 | Fill #1

## 2015-11-10 MED FILL — MELOXICAM 7.5 MG TABLET: 7.5 | 30 days supply | Qty: 30 | Fill #1

## 2015-11-10 MED FILL — PIOGLITAZONE HCL 15 MG TAB: 15 | 30 days supply | Qty: 30 | Fill #1

## 2015-11-18 MED FILL — LEVEMIR FLEXTOUCH 100 UNITS: 100 | 30 days supply | Qty: 15 | Fill #2

## 2015-11-18 MED FILL — metFORMIN HCL 1000 MG TABS: 1000 | 30 days supply | Qty: 60 | Fill #3

## 2015-11-18 MED FILL — GABAPENTIN 600 MG TABLET: 600 | 30 days supply | Qty: 60 | Fill #1

## 2015-12-01 MED FILL — FOLIVANE-PLUS CAPSULE: 90 days supply | Qty: 90 | Fill #1

## 2015-12-01 MED FILL — UNIFINE PENTIPS 8MM 31G: 31G X 8 MM | 30 days supply | Qty: 100 | Fill #0

## 2015-12-09 DIAGNOSIS — Z3040 Encounter for surveillance of contraceptives, unspecified: Secondary | ICD-10-CM | POA: Diagnosis not present

## 2015-12-09 MED FILL — MEDROXYPROG 150 MG/ML SYR: 150 | 84 days supply | Qty: 1 | Fill #2

## 2015-12-12 DIAGNOSIS — E1140 Type 2 diabetes mellitus with diabetic neuropathy, unspecified: Secondary | ICD-10-CM | POA: Diagnosis not present

## 2015-12-12 DIAGNOSIS — K5900 Constipation, unspecified: Secondary | ICD-10-CM | POA: Diagnosis not present

## 2015-12-12 DIAGNOSIS — E860 Dehydration: Secondary | ICD-10-CM | POA: Diagnosis not present

## 2015-12-12 DIAGNOSIS — R55 Syncope and collapse: Secondary | ICD-10-CM | POA: Diagnosis not present

## 2015-12-12 MED FILL — XULTOPHY 100 UNIT-3.6MG/ML: 100-3.6 | 90 days supply | Qty: 15 | Fill #0

## 2015-12-16 MED FILL — PIOGLITAZONE HCL 15 MG TAB: 15 | 30 days supply | Qty: 30 | Fill #2

## 2015-12-16 MED FILL — FARXIGA 10 MG TABLET: 10 | 30 days supply | Qty: 30 | Fill #2

## 2015-12-16 MED FILL — MELOXICAM 7.5 MG TABLET: 7.5 | 30 days supply | Qty: 30 | Fill #2

## 2015-12-21 MED FILL — GABAPENTIN 600 MG TABLET: 600 | 30 days supply | Qty: 60 | Fill #2

## 2015-12-21 MED FILL — metFORMIN HCL 1000 MG TABS: 1000 | 30 days supply | Qty: 60 | Fill #0

## 2016-01-18 MED FILL — MELOXICAM 7.5 MG TABLET: 7.5 | 30 days supply | Qty: 30 | Fill #3

## 2016-01-18 MED FILL — PIOGLITAZONE HCL 15 MG TAB: 15 | 30 days supply | Qty: 30 | Fill #3

## 2016-01-18 MED FILL — FARXIGA 10 MG TABLET: 10 | 30 days supply | Qty: 30 | Fill #3

## 2016-01-20 MED FILL — metFORMIN HCL 1000 MG TABS: 1000 | 30 days supply | Qty: 60 | Fill #1

## 2016-01-20 MED FILL — GABAPENTIN 600 MG TABLET: 600 | 30 days supply | Qty: 60 | Fill #3

## 2016-02-20 MED FILL — metFORMIN HCL 1000 MG TABS: 1000 | 30 days supply | Qty: 60 | Fill #2

## 2016-02-20 MED FILL — GABAPENTIN 600 MG TABLET: 600 | 30 days supply | Qty: 60 | Fill #4

## 2016-02-20 MED FILL — PIOGLITAZONE HCL 15 MG TAB: 15 | 30 days supply | Qty: 30 | Fill #4

## 2016-02-21 MED FILL — MELOXICAM 7.5 MG TABLET: 7.5 | 30 days supply | Qty: 30 | Fill #0

## 2016-02-23 MED FILL — FARXIGA 10 MG TABLET: 10 | 30 days supply | Qty: 30 | Fill #0

## 2016-02-29 MED FILL — MEDROXYPROG 150 MG/ML SYR: 150 | 84 days supply | Qty: 1 | Fill #3

## 2016-03-01 DIAGNOSIS — Z3009 Encounter for other general counseling and advice on contraception: Secondary | ICD-10-CM | POA: Diagnosis not present

## 2016-03-21 MED FILL — PIOGLITAZONE HCL 15 MG TAB: 15 | 30 days supply | Qty: 30 | Fill #5

## 2016-03-21 MED FILL — metFORMIN HCL 1000 MG TABS: 1000 | 30 days supply | Qty: 60 | Fill #3

## 2016-03-21 MED FILL — GABAPENTIN 600 MG TABLET: 600 | 30 days supply | Qty: 60 | Fill #5

## 2016-03-21 MED FILL — MELOXICAM 7.5 MG TABLET: 7.5 | 30 days supply | Qty: 30 | Fill #1

## 2016-04-03 MED FILL — XULTOPHY 100 UNIT-3.6MG/ML: 100-3.6 | 90 days supply | Qty: 15 | Fill #0

## 2016-04-11 DIAGNOSIS — R000 Tachycardia, unspecified: Secondary | ICD-10-CM | POA: Diagnosis not present

## 2016-04-11 DIAGNOSIS — Z0000 Encounter for general adult medical examination without abnormal findings: Secondary | ICD-10-CM | POA: Diagnosis not present

## 2016-04-11 DIAGNOSIS — E1140 Type 2 diabetes mellitus with diabetic neuropathy, unspecified: Secondary | ICD-10-CM | POA: Diagnosis not present

## 2016-04-11 MED FILL — SUMATRIPTAN SUCC 50 MG TAB: 50 | 30 days supply | Qty: 9 | Fill #0

## 2016-04-19 MED FILL — FOLIVANE-PLUS CAPSULE: 90 days supply | Qty: 90 | Fill #2

## 2016-04-19 MED FILL — metFORMIN HCL 1000 MG TABS: 1000 | 30 days supply | Qty: 60 | Fill #4

## 2016-04-19 MED FILL — PIOGLITAZONE HCL 15 MG TAB: 15 | 30 days supply | Qty: 30 | Fill #0

## 2016-04-19 MED FILL — GABAPENTIN 600 MG TABLET: 600 | 30 days supply | Qty: 60 | Fill #0

## 2016-04-19 MED FILL — MELOXICAM 7.5 MG TABLET: 7.5 | 30 days supply | Qty: 30 | Fill #2

## 2016-05-24 MED FILL — GABAPENTIN 600 MG TABLET: 600 | 30 days supply | Qty: 60 | Fill #1

## 2016-05-24 MED FILL — PIOGLITAZONE HCL 15 MG TAB: 15 | 30 days supply | Qty: 30 | Fill #1

## 2016-05-24 MED FILL — UNIFINE PENTIPS 8MM 31G: 31G X 8 MM | 30 days supply | Qty: 100 | Fill #1

## 2016-05-24 MED FILL — metFORMIN HCL 1000 MG TABS: 1000 | 30 days supply | Qty: 60 | Fill #5

## 2016-05-24 MED FILL — MELOXICAM 7.5 MG TABLET: 7.5 | 30 days supply | Qty: 30 | Fill #3

## 2016-05-29 DIAGNOSIS — Z01419 Encounter for gynecological examination (general) (routine) without abnormal findings: Secondary | ICD-10-CM | POA: Diagnosis not present

## 2016-05-29 DIAGNOSIS — Z1231 Encounter for screening mammogram for malignant neoplasm of breast: Secondary | ICD-10-CM | POA: Diagnosis not present

## 2016-05-29 DIAGNOSIS — Z124 Encounter for screening for malignant neoplasm of cervix: Secondary | ICD-10-CM | POA: Diagnosis not present

## 2016-05-29 DIAGNOSIS — B009 Herpesviral infection, unspecified: Secondary | ICD-10-CM | POA: Diagnosis not present

## 2016-05-29 DIAGNOSIS — Z113 Encounter for screening for infections with a predominantly sexual mode of transmission: Secondary | ICD-10-CM | POA: Diagnosis not present

## 2016-05-29 DIAGNOSIS — N760 Acute vaginitis: Secondary | ICD-10-CM | POA: Diagnosis not present

## 2016-05-29 DIAGNOSIS — N926 Irregular menstruation, unspecified: Secondary | ICD-10-CM | POA: Diagnosis not present

## 2016-06-18 ENCOUNTER — Other Ambulatory Visit: Payer: Self-pay | Admitting: Obstetrics and Gynecology

## 2016-06-18 DIAGNOSIS — N926 Irregular menstruation, unspecified: Secondary | ICD-10-CM | POA: Diagnosis not present

## 2016-06-18 MED FILL — VALACYCLOVIR HCL 500 MG TAB: 500 | 30 days supply | Qty: 36 | Fill #0

## 2016-06-20 MED FILL — MELOXICAM 7.5 MG TABLET: 7.5 | 30 days supply | Qty: 30 | Fill #4

## 2016-06-20 MED FILL — PIOGLITAZONE HCL 15 MG TAB: 15 | 30 days supply | Qty: 30 | Fill #2

## 2016-06-21 MED FILL — GABAPENTIN 600 MG TABLET: 600 | 30 days supply | Qty: 60 | Fill #2

## 2016-06-22 MED FILL — XULTOPHY 100 UNIT-3.6MG/ML: 100-3.6 | 83 days supply | Qty: 15 | Fill #0

## 2016-07-05 DIAGNOSIS — D649 Anemia, unspecified: Secondary | ICD-10-CM | POA: Diagnosis not present

## 2016-07-05 DIAGNOSIS — M79645 Pain in left finger(s): Secondary | ICD-10-CM | POA: Diagnosis not present

## 2016-07-05 DIAGNOSIS — R609 Edema, unspecified: Secondary | ICD-10-CM | POA: Diagnosis not present

## 2016-07-05 DIAGNOSIS — E1140 Type 2 diabetes mellitus with diabetic neuropathy, unspecified: Secondary | ICD-10-CM | POA: Diagnosis not present

## 2016-07-10 ENCOUNTER — Other Ambulatory Visit: Payer: Self-pay | Admitting: Obstetrics and Gynecology

## 2016-07-10 DIAGNOSIS — Z3202 Encounter for pregnancy test, result negative: Secondary | ICD-10-CM | POA: Diagnosis not present

## 2016-07-10 DIAGNOSIS — N920 Excessive and frequent menstruation with regular cycle: Secondary | ICD-10-CM | POA: Diagnosis not present

## 2016-07-10 DIAGNOSIS — E109 Type 1 diabetes mellitus without complications: Secondary | ICD-10-CM | POA: Diagnosis not present

## 2016-07-10 DIAGNOSIS — N926 Irregular menstruation, unspecified: Secondary | ICD-10-CM | POA: Diagnosis not present

## 2016-07-10 DIAGNOSIS — N840 Polyp of corpus uteri: Secondary | ICD-10-CM | POA: Diagnosis not present

## 2016-07-10 MED FILL — OXYCODONE W/APAP 5/325 TAB: 5-325 | 5 days supply | Qty: 20 | Fill #0

## 2016-07-13 ENCOUNTER — Encounter (HOSPITAL_COMMUNITY): Payer: Self-pay | Admitting: Emergency Medicine

## 2016-07-13 ENCOUNTER — Encounter (HOSPITAL_COMMUNITY): Payer: Self-pay

## 2016-07-13 ENCOUNTER — Ambulatory Visit (HOSPITAL_COMMUNITY)
Admission: EM | Admit: 2016-07-13 | Discharge: 2016-07-13 | Disposition: A | Discharge status: Other Acute Inpatient Hospital | Payer: 59

## 2016-07-13 ENCOUNTER — Emergency Department (HOSPITAL_COMMUNITY)
Admission: EM | Admit: 2016-07-13 | Discharge: 2016-07-13 | Disposition: A | Discharge status: Home / Self Care | Payer: 59 | Attending: Emergency Medicine | Admitting: Emergency Medicine

## 2016-07-13 ENCOUNTER — Emergency Department (HOSPITAL_COMMUNITY): Payer: 59

## 2016-07-13 DIAGNOSIS — E119 Type 2 diabetes mellitus without complications: Secondary | ICD-10-CM | POA: Diagnosis not present

## 2016-07-13 DIAGNOSIS — R05 Cough: Secondary | ICD-10-CM | POA: Insufficient documentation

## 2016-07-13 DIAGNOSIS — Z79899 Other long term (current) drug therapy: Secondary | ICD-10-CM | POA: Insufficient documentation

## 2016-07-13 DIAGNOSIS — R339 Retention of urine, unspecified: Secondary | ICD-10-CM | POA: Diagnosis not present

## 2016-07-13 DIAGNOSIS — Z91040 Latex allergy status: Secondary | ICD-10-CM | POA: Insufficient documentation

## 2016-07-13 DIAGNOSIS — Z794 Long term (current) use of insulin: Secondary | ICD-10-CM | POA: Diagnosis not present

## 2016-07-13 DIAGNOSIS — R6889 Other general symptoms and signs: Secondary | ICD-10-CM

## 2016-07-13 DIAGNOSIS — J111 Influenza due to unidentified influenza virus with other respiratory manifestations: Secondary | ICD-10-CM | POA: Diagnosis not present

## 2016-07-13 DIAGNOSIS — Z7982 Long term (current) use of aspirin: Secondary | ICD-10-CM | POA: Diagnosis not present

## 2016-07-13 LAB — COMPREHENSIVE METABOLIC PANEL
ALT: 25 U/L (ref 14–54)
AST: 32 U/L (ref 15–41)
Albumin: 3.3 g/dL — ABNORMAL LOW (ref 3.5–5.0)
Alkaline Phosphatase: 116 U/L (ref 38–126)
Anion gap: 11 (ref 5–15)
BUN: 6 mg/dL (ref 6–20)
CHLORIDE: 100 mmol/L — AB (ref 101–111)
CO2: 22 mmol/L (ref 22–32)
Calcium: 9.1 mg/dL (ref 8.9–10.3)
Creatinine, Ser: 0.61 mg/dL (ref 0.44–1.00)
GFR calc Af Amer: >60 mL/min (ref >60)
Glucose, Bld: 367 mg/dL — ABNORMAL HIGH (ref 65–99)
POTASSIUM: 4.5 mmol/L (ref 3.5–5.1)
SODIUM: 133 mmol/L — AB (ref 135–145)
Total Bilirubin: 0.5 mg/dL (ref 0.3–1.2)
Total Protein: 7.8 g/dL (ref 6.5–8.1)

## 2016-07-13 LAB — URINALYSIS, ROUTINE W REFLEX MICROSCOPIC
BILIRUBIN URINE: NEGATIVE
Bacteria, UA: NONE SEEN
Glucose, UA: >=500 mg/dL — AB
Hgb urine dipstick: NEGATIVE
KETONES UR: NEGATIVE mg/dL
Leukocytes, UA: NEGATIVE
Nitrite: NEGATIVE
PROTEIN: 100 mg/dL — AB
Specific Gravity, Urine: 1.037 — ABNORMAL HIGH (ref 1.005–1.030)
pH: 5 (ref 5.0–8.0)

## 2016-07-13 LAB — CBC WITH DIFFERENTIAL/PLATELET
Basophils Absolute: 0.1 10*3/uL (ref 0.0–0.1)
Basophils Relative: 1 %
EOS PCT: 2 %
Eosinophils Absolute: 0.2 10*3/uL (ref 0.0–0.7)
HCT: 40.9 % (ref 36.0–46.0)
HEMOGLOBIN: 12.8 g/dL (ref 12.0–15.0)
LYMPHS ABS: 2.3 10*3/uL (ref 0.7–4.0)
LYMPHS PCT: 20 %
MCH: 23.8 pg — AB (ref 26.0–34.0)
MCHC: 31.3 g/dL (ref 30.0–36.0)
MCV: 76 fL — AB (ref 78.0–100.0)
MONOS PCT: 9 %
Monocytes Absolute: 1 10*3/uL (ref 0.1–1.0)
NEUTROS PCT: 68 %
Neutro Abs: 8.2 10*3/uL — ABNORMAL HIGH (ref 1.7–7.7)
Platelets: 426 10*3/uL — ABNORMAL HIGH (ref 150–400)
RBC: 5.38 MIL/uL — AB (ref 3.87–5.11)
RDW: 16.2 % — ABNORMAL HIGH (ref 11.5–15.5)
WBC: 11.9 10*3/uL — AB (ref 4.0–10.5)

## 2016-07-13 LAB — I-STAT CG4 LACTIC ACID, ED
LACTIC ACID, VENOUS: 1.68 mmol/L (ref 0.5–1.9)
Lactic Acid, Venous: 2.31 mmol/L (ref 0.5–1.9)

## 2016-07-13 MED ORDER — ACETAMINOPHEN 500 MG PO TABS
1000.0000 mg | ORAL_TABLET | Freq: Once | ORAL | Status: AC
  Administered 2016-07-13: 1000 mg via ORAL
  Filled 2016-07-13: qty 2

## 2016-07-13 MED ORDER — ALBUTEROL SULFATE HFA 108 (90 BASE) MCG/ACT IN AERS
2.0000 | INHALATION_SPRAY | Freq: Once | RESPIRATORY_TRACT | Status: AC
  Administered 2016-07-13: 2 via RESPIRATORY_TRACT
  Filled 2016-07-13: qty 6.7

## 2016-07-13 MED ORDER — SODIUM CHLORIDE 0.9 % IV BOLUS (SEPSIS)
1000.0000 mL | Freq: Once | INTRAVENOUS | Status: AC
  Administered 2016-07-13: 1000 mL via INTRAVENOUS

## 2016-07-13 NOTE — ED Provider Notes (Signed)
[facility] DEPT Provider Note   CSN: [id number] Arrival date & time: 07/13/16  1828     History   Chief Complaint Chief Complaint  Patient presents with  . Cough  . Generalized Body Aches    HPI Michelle Tanner is a 46 y.o. female.  Patient presents with worsening fever bodyaches nonproductive cough and general weakness is started since procedure on Tuesday. Patient had ablation impulse removed from ovaries in a brief procedure that lasted proximal 1 hour. Patient denies any blood clot history and currently no shortness of breath or chest pain. Patient has had fever as well. Patient does have sick contact with similar. Patient does have mild urinary retention symptom.      Past Medical History:  Diagnosis Date  . Diabetes mellitus   . Frozen shoulder    left  . Hx of migraine headaches   . Migraines   . Neuromuscular disorder [place])    diabetic neuropathy    Patient Active Problem List   Diagnosis Date Noted  . Herpes 10/24/2011  . Dysmenorrhea 10/11/2011  . Excessive or frequent menstruation 10/11/2011    Past Surgical History:  Procedure Laterality Date  . ABLATION    . CESAREAN SECTION    . LAPAROSCOPIC ENDOMETRIOSIS FULGURATION    . POLYPECTOMY     Reports either Ovarina or Uterine.   . WISDOM TOOTH EXTRACTION      OB History    Gravida Para Term Preterm AB Living   4       3 1    SAB TAB Ectopic Multiple Live Births                   Home Medications    Prior to Admission medications   Medication Sig Start Date End Date Taking? Authorizing Provider  Aspirin-Salicylamide-Caffeine (BC HEADACHE POWDER PO) Take 1 packet by mouth 2 (two) times daily as needed. For pain    Historical Provider, MD  dapagliflozin propanediol (FARXIGA) 10 MG TABS tablet Take 10 mg by mouth daily.    Historical Provider, MD  diclofenac (VOLTAREN) 75 MG EC tablet Take 75 mg by mouth 2 (two) times daily as needed.    Historical Provider, MD  eszopiclone (LUNESTA) 1  MG TABS tablet Take 3 mg by mouth at bedtime as needed for sleep. Take immediately before bedtime    Historical Provider, MD  FeFum-FePoly-FA-B Cmp-C-Biot (FOLIVANE-PLUS PO) Take 1 capsule by mouth daily.    Historical Provider, MD  fluconazole (DIFLUCAN) 150 MG tablet 1 po each day for 3 days 06/19/12   Ena Dawley, MD  gabapentin (NEURONTIN) 600 MG tablet Take 600 mg by mouth 2 (two) times daily.    Historical Provider, MD  ibuprofen (ADVIL,MOTRIN) 800 MG tablet Take 800 mg by mouth every 8 (eight) hours as needed. For pain    Historical Provider, MD  insulin detemir (LEVEMIR) 100 UNIT/ML injection Inject 48 Units into the skin at bedtime.    Historical Provider, MD  lidocaine (XYLOCAINE) 5 % ointment Apply topically as needed. 06/19/12   Ena Dawley, MD  Liraglutide -Weight Management (SAXENDA) 18 MG/3ML SOPN Inject 5 mg into the skin daily.    Historical Provider, MD  metFORMIN (GLUCOPHAGE) 1000 MG tablet Take 1,000 mg by mouth 2 (two) times daily with a meal.    Historical Provider, MD  Multiple Vitamin (MULITIVITAMIN WITH MINERALS) TABS Take 1 tablet by mouth daily.    Historical Provider, MD  naproxen sodium (ANAPROX) 220 MG tablet Take 440-660  mg by mouth 2 (two) times daily as needed. For pain    Historical Provider, MD  nystatin-triamcinolone (MYCOLOG II) cream Apply topically 4 (four) times daily. 06/19/12   Ena Dawley, MD  pioglitazone (ACTOS) 15 MG tablet Take 15 mg by mouth daily.    Historical Provider, MD  SUMAtriptan (IMITREX) 50 MG tablet Take 50 mg by mouth every 2 (two) hours as needed.    Historical Provider, MD  Vitamin D, Ergocalciferol, (DRISDOL) 50000 UNITS CAPS Take 50,000 Units by mouth.    Historical Provider, MD    Family History Family History  Problem Relation Age of Onset  . Rectal cancer Paternal Grandmother   . Diabetes Father   . Kidney disease Father   . CAD Father   . Cancer Mother   . Cancer Paternal Uncle   . Cancer Paternal Aunt      Social History Social History  Substance Use Topics  . Smoking status: Never Smoker  . Smokeless tobacco: Never Used  . Alcohol use Yes     Comment: occasionally  about twice a year     Allergies   Latex   Review of Systems Review of Systems  Constitutional: Positive for appetite change, chills and fever.  HENT: Negative for congestion.   Eyes: Negative for visual disturbance.  Respiratory: Negative for shortness of breath.   Cardiovascular: Negative for chest pain.  Gastrointestinal: Positive for abdominal pain (mild with coughing), diarrhea and nausea. Negative for vomiting.  Genitourinary: Negative for dysuria and flank pain.  Musculoskeletal: Positive for arthralgias. Negative for back pain, neck pain and neck stiffness.  Skin: Negative for rash.  Neurological: Negative for light-headedness and headaches.     Physical Exam Updated Vital Signs BP 130/92   Pulse 113   Temp 100.4 F (38 C) (Oral)   Resp 23   Ht 5' (1.524 m)   Wt 193 lb (87.5 kg)   SpO2 97%   BMI 37.69 kg/m   Physical Exam  Constitutional: She appears well-developed and well-nourished. No distress.  HENT:  Head: Normocephalic and atraumatic.  Eyes: Conjunctivae are normal.  Neck: Neck supple.  Cardiovascular: Normal rate and regular rhythm.   Pulmonary/Chest: Effort normal and breath sounds normal.  Abdominal: Soft. There is no tenderness.  Musculoskeletal: She exhibits no edema.  Neurological: She is alert.  Skin: Skin is warm and dry.  Psychiatric: She has a normal mood and affect.  Nursing note and vitals reviewed.    ED Treatments / Results  Labs (all labs ordered are listed, but only abnormal results are displayed) Labs Reviewed  COMPREHENSIVE METABOLIC PANEL - Abnormal; Notable for the following:       Result Value   Sodium 133 (*)    Chloride 100 (*)    Glucose, Bld 367 (*)    Albumin 3.3 (*)    All other components within normal limits  CBC WITH DIFFERENTIAL/PLATELET  - Abnormal; Notable for the following:    WBC 11.9 (*)    RBC 5.38 (*)    MCV 76.0 (*)    MCH 23.8 (*)    RDW 16.2 (*)    Platelets 426 (*)    Neutro Abs 8.2 (*)    All other components within normal limits  URINALYSIS, ROUTINE W REFLEX MICROSCOPIC - Abnormal; Notable for the following:    Specific Gravity, Urine 1.037 (*)    Glucose, UA >=500 (*)    Protein, ur 100 (*)    Squamous Epithelial / LPF 0-5 (*)  All other components within normal limits  I-STAT CG4 LACTIC ACID, ED - Abnormal; Notable for the following:    Lactic Acid, Venous 2.31 (*)    All other components within normal limits  I-STAT CG4 LACTIC ACID, ED    EKG  EKG Interpretation  Date/Time:  Friday July 13 2016 18:48:46 EST Ventricular Rate:  111 PR Interval:  118 QRS Duration: 74 QT Interval:  338 QTC Calculation: 459 R Axis:   -16 Text Interpretation:  Sinus tachycardia Otherwise normal ECG Confirmed by Kwame Ryland MD, Arbadella Kimbler [phone]) on 07/13/2016 8:01:44 PM       Radiology Dg Chest 2 View  Result Date: 07/13/2016 CLINICAL DATA:  Cough, fever, chills and fatigue for 13 days. EXAM: CHEST  2 VIEW COMPARISON:  CT chest 09/27/2011.  PA and lateral chest 09/26/2011. FINDINGS: Lungs are clear. Heart size is normal. No pneumothorax or pleural fluid. No acute bony abnormality. IMPRESSION: No acute disease. Electronically Signed   By: Inge Rise M.D.   On: 07/13/2016 19:14    Procedures Procedures (including critical care time)  Medications Ordered in ED Medications  sodium chloride 0.9 % bolus 1,000 mL (0 mLs Intravenous Stopped 07/13/16 2223)  acetaminophen (TYLENOL) tablet 1,000 mg (1,000 mg Oral Given 07/13/16 2034)  albuterol (PROVENTIL HFA;VENTOLIN HFA) 108 (90 Base) MCG/ACT inhaler 2 puff (2 puffs Inhalation Given 07/13/16 2317)     Initial Impression / Assessment and Plan / ED Course  I have reviewed the triage vital signs and the nursing notes.  Pertinent labs & imaging results that were  available during my care of the patient were reviewed by me and considered in my medical decision making (see chart for details).    Patient presents with flulike symptoms. Patient improved on reassessment vitals improved, lactate improved. Plan for albuterol with persisting cough to see if that would bring symptomatic management. No indication for admission at this time. Abdominal exam overall benign. Urinalysis pending and out cath if unable to urinate.  Results and differential diagnosis were discussed with the patient/parent/guardian. Xrays were independently reviewed by myself.  Close follow up outpatient was discussed, comfortable with the plan.   Medications  sodium chloride 0.9 % bolus 1,000 mL (0 mLs Intravenous Stopped 07/13/16 2223)  acetaminophen (TYLENOL) tablet 1,000 mg (1,000 mg Oral Given 07/13/16 2034)  albuterol (PROVENTIL HFA;VENTOLIN HFA) 108 (90 Base) MCG/ACT inhaler 2 puff (2 puffs Inhalation Given 07/13/16 2317)    Vitals:   07/13/16 1837 07/13/16 1838 07/13/16 1938  BP: 122/98  130/92  Pulse: 113    Resp: 23  23  Temp: 100.4 F (38 C)    TempSrc: Oral    SpO2: 97%    Weight:  193 lb (87.5 kg)   Height:  5' (1.524 m)     Final diagnoses:  Flu-like symptoms  Urinary retention     Final Clinical Impressions(s) / ED Diagnoses   Final diagnoses:  Flu-like symptoms  Urinary retention    New Prescriptions New Prescriptions   No medications on file     Elnora Morrison, MD 07/13/16 2322

## 2016-07-13 NOTE — ED Notes (Signed)
ED Provider at bedside. 

## 2016-07-13 NOTE — Discharge Instructions (Signed)
If you were given medicines take as directed.  If you are on coumadin or contraceptives realize their levels and effectiveness is altered by many different medicines.  If you have any reaction (rash, tongues swelling, other) to the medicines stop taking and see a physician.    If your blood pressure was elevated in the ER make sure you follow up for management with a primary doctor or return for chest pain, shortness of breath or stroke symptoms.  Please follow up as directed and return to the ER or see a physician for new or worsening symptoms.  Thank you. Vitals:   07/13/16 1837 07/13/16 1838 07/13/16 1938  BP: 122/98  130/92  Pulse: 113    Resp: 23  23  Temp: 100.4 F (38 C)    TempSrc: Oral    SpO2: 97%    Weight:  193 lb (87.5 kg)   Height:  5' (1.524 m)

## 2016-07-13 NOTE — ED Triage Notes (Signed)
Per Pt, Pt is coming from home with complaints of fever, body aches, non-productive cough, weakness, and SOB that started after a procedure on Tuesday. Pt reports having ablation and polyps removed from ovaries.

## 2016-07-13 NOTE — ED Notes (Signed)
Attempted PIVx3.

## 2016-07-23 MED FILL — metFORMIN HCL 1000 MG TABS: 1000 | 30 days supply | Qty: 60 | Fill #0

## 2016-07-25 DIAGNOSIS — E1140 Type 2 diabetes mellitus with diabetic neuropathy, unspecified: Secondary | ICD-10-CM | POA: Diagnosis not present

## 2016-07-25 DIAGNOSIS — H6121 Impacted cerumen, right ear: Secondary | ICD-10-CM | POA: Diagnosis not present

## 2016-07-25 DIAGNOSIS — J069 Acute upper respiratory infection, unspecified: Secondary | ICD-10-CM | POA: Diagnosis not present

## 2016-07-25 MED FILL — AMOXICILLIN 875 MG TABLET: 875 | 10 days supply | Qty: 20 | Fill #0

## 2016-07-25 MED FILL — MEDROXYPROG 150 MG/ML SYR: 150 | 84 days supply | Qty: 1 | Fill #0

## 2016-07-25 MED FILL — GABAPENTIN 600 MG TABLET: 600 | 30 days supply | Qty: 60 | Fill #3

## 2016-07-25 MED FILL — MELOXICAM 7.5 MG TABLET: 7.5 | 30 days supply | Qty: 30 | Fill #5

## 2016-07-25 MED FILL — HYDROCODONE-HOMATROPINE SYR: 5-1.5 | 6 days supply | Qty: 120 | Fill #0

## 2016-07-25 MED FILL — PIOGLITAZONE HCL 15 MG TAB: 15 | 30 days supply | Qty: 30 | Fill #3

## 2016-08-08 MED FILL — HUMALOG 100 UNITS/ML KWIKPE: 100 | 60 days supply | Qty: 15 | Fill #0

## 2016-08-24 MED FILL — metFORMIN HCL 1000 MG TABS: 1000 | 30 days supply | Qty: 60 | Fill #1

## 2016-08-24 MED FILL — GABAPENTIN 600 MG TABLET: 600 | 30 days supply | Qty: 60 | Fill #4

## 2016-08-24 MED FILL — UNIFINE PENTIPS 8MM 31G: 31G X 8 MM | 30 days supply | Qty: 100 | Fill #2

## 2016-09-10 MED FILL — XULTOPHY 100 UNIT-3.6MG/ML: 100-3.6 | 83 days supply | Qty: 15 | Fill #1

## 2016-09-27 MED FILL — metFORMIN HCL 1000 MG TABS: 1000 | 30 days supply | Qty: 60 | Fill #2

## 2016-09-27 MED FILL — GABAPENTIN 600 MG TABLET: 600 | 30 days supply | Qty: 60 | Fill #5

## 2016-10-25 MED FILL — metFORMIN HCL 1000 MG TABS: 1000 | 30 days supply | Qty: 60 | Fill #3

## 2016-10-26 MED FILL — GABAPENTIN 600 MG TABLET: 600 | 30 days supply | Qty: 60 | Fill #0

## 2016-10-29 DIAGNOSIS — E1140 Type 2 diabetes mellitus with diabetic neuropathy, unspecified: Secondary | ICD-10-CM | POA: Diagnosis not present

## 2016-10-29 DIAGNOSIS — H6121 Impacted cerumen, right ear: Secondary | ICD-10-CM | POA: Diagnosis not present

## 2016-10-29 DIAGNOSIS — Z79899 Other long term (current) drug therapy: Secondary | ICD-10-CM | POA: Diagnosis not present

## 2016-10-29 DIAGNOSIS — Z6836 Body mass index (BMI) 36.0-36.9, adult: Secondary | ICD-10-CM | POA: Diagnosis not present

## 2016-10-29 MED FILL — MELOXICAM 7.5 MG TABLET: 7.5 | 30 days supply | Qty: 30 | Fill #0

## 2016-10-29 MED FILL — FOLIVANE-PLUS CAPSULE: 30 days supply | Qty: 30 | Fill #0

## 2016-10-29 MED FILL — PIOGLITAZONE HCL 15 MG TAB: 15 | 30 days supply | Qty: 30 | Fill #0

## 2016-10-29 MED FILL — FARXIGA 10 MG TABLET: 10 | 30 days supply | Qty: 30 | Fill #0

## 2016-11-06 DIAGNOSIS — Z09 Encounter for follow-up examination after completed treatment for conditions other than malignant neoplasm: Secondary | ICD-10-CM | POA: Diagnosis not present

## 2016-11-27 MED FILL — metFORMIN HCL 1000 MG TABS: 1000 | 90 days supply | Qty: 180 | Fill #0

## 2016-11-27 MED FILL — GABAPENTIN 600 MG TABLET: 600 | 30 days supply | Qty: 60 | Fill #0

## 2016-12-07 MED FILL — XULTOPHY 100 UNIT-3.6MG/ML: 100-3.6 | 54 days supply | Qty: 15 | Fill #0

## 2017-01-04 MED FILL — GABAPENTIN 600 MG TABLET: 600 | 30 days supply | Qty: 60 | Fill #1

## 2017-01-31 MED FILL — MELOXICAM 7.5 MG TABLET: 7.5 | 30 days supply | Qty: 30 | Fill #1

## 2017-01-31 MED FILL — FARXIGA 10 MG TABLET: 10 | 30 days supply | Qty: 30 | Fill #1

## 2017-01-31 MED FILL — GABAPENTIN 600 MG TABLET: 600 | 30 days supply | Qty: 60 | Fill #2

## 2017-01-31 MED FILL — VALACYCLOVIR HCL 500 MG TAB: 500 | 30 days supply | Qty: 36 | Fill #1

## 2017-02-08 MED FILL — FOLIVANE-PLUS CAPSULE: 30 days supply | Qty: 30 | Fill #1

## 2017-02-08 MED FILL — XULTOPHY 100 UNIT-3.6MG/ML: 100-3.6 | 54 days supply | Qty: 15 | Fill #1

## 2017-02-28 DIAGNOSIS — L0291 Cutaneous abscess, unspecified: Secondary | ICD-10-CM | POA: Diagnosis not present

## 2017-02-28 MED FILL — CEPHALEXIN 500 MG CAPSULE: 500 | 7 days supply | Qty: 28 | Fill #0

## 2017-03-08 MED FILL — GABAPENTIN 600 MG TABLET: 600 | 30 days supply | Qty: 60 | Fill #3

## 2017-03-08 MED FILL — MELOXICAM 7.5 MG TABLET: 7.5 | 30 days supply | Qty: 30 | Fill #2

## 2017-04-05 DIAGNOSIS — E113292 Type 2 diabetes mellitus with mild nonproliferative diabetic retinopathy without macular edema, left eye: Secondary | ICD-10-CM | POA: Diagnosis not present

## 2017-04-05 DIAGNOSIS — H524 Presbyopia: Secondary | ICD-10-CM | POA: Diagnosis not present

## 2017-04-05 DIAGNOSIS — H52223 Regular astigmatism, bilateral: Secondary | ICD-10-CM | POA: Diagnosis not present

## 2017-04-05 DIAGNOSIS — H5213 Myopia, bilateral: Secondary | ICD-10-CM | POA: Diagnosis not present

## 2017-04-05 MED FILL — FOLIVANE-PLUS CAPSULE: 30 days supply | Qty: 30 | Fill #0

## 2017-04-05 MED FILL — GABAPENTIN 600 MG TABLET: 600 | 30 days supply | Qty: 60 | Fill #0

## 2017-04-05 MED FILL — PIOGLITAZONE HCL 15 MG TAB: 15 | 30 days supply | Qty: 30 | Fill #1

## 2017-04-05 MED FILL — FARXIGA 10 MG TABLET: 10 | 30 days supply | Qty: 30 | Fill #2

## 2017-04-05 MED FILL — metFORMIN HCL 1000 MG TABS: 1000 | 90 days supply | Qty: 180 | Fill #1

## 2017-04-05 MED FILL — MELOXICAM 7.5 MG TABLET: 7.5 | 30 days supply | Qty: 30 | Fill #0

## 2017-04-22 MED FILL — XULTOPHY 100 UNIT-3.6MG/ML: 100-3.6 | 54 days supply | Qty: 15 | Fill #0

## 2017-05-01 DIAGNOSIS — R609 Edema, unspecified: Secondary | ICD-10-CM | POA: Diagnosis not present

## 2017-05-01 DIAGNOSIS — E1140 Type 2 diabetes mellitus with diabetic neuropathy, unspecified: Secondary | ICD-10-CM | POA: Diagnosis not present

## 2017-05-01 DIAGNOSIS — E559 Vitamin D deficiency, unspecified: Secondary | ICD-10-CM | POA: Diagnosis not present

## 2017-05-01 DIAGNOSIS — D649 Anemia, unspecified: Secondary | ICD-10-CM | POA: Diagnosis not present

## 2017-05-03 ENCOUNTER — Other Ambulatory Visit: Payer: Self-pay | Admitting: Nurse Practitioner

## 2017-05-03 DIAGNOSIS — R609 Edema, unspecified: Secondary | ICD-10-CM

## 2017-05-06 ENCOUNTER — Ambulatory Visit
Admission: RE | Admit: 2017-05-06 | Discharge: 2017-05-06 | Disposition: A | Discharge status: Home / Self Care | Payer: 59 | Source: Ambulatory Visit | Attending: Nurse Practitioner | Admitting: Nurse Practitioner

## 2017-05-06 DIAGNOSIS — R609 Edema, unspecified: Secondary | ICD-10-CM

## 2017-05-06 DIAGNOSIS — R600 Localized edema: Secondary | ICD-10-CM | POA: Diagnosis not present

## 2017-05-07 MED FILL — GABAPENTIN 600 MG TABLET: 600 | 30 days supply | Qty: 60 | Fill #4

## 2017-05-29 DIAGNOSIS — D509 Iron deficiency anemia, unspecified: Secondary | ICD-10-CM | POA: Diagnosis not present

## 2017-05-29 DIAGNOSIS — K625 Hemorrhage of anus and rectum: Secondary | ICD-10-CM | POA: Diagnosis not present

## 2017-05-29 DIAGNOSIS — E1140 Type 2 diabetes mellitus with diabetic neuropathy, unspecified: Secondary | ICD-10-CM | POA: Diagnosis not present

## 2017-05-29 DIAGNOSIS — Z0000 Encounter for general adult medical examination without abnormal findings: Secondary | ICD-10-CM | POA: Diagnosis not present

## 2017-06-03 MED FILL — GABAPENTIN 600 MG TABLET: 600 | 30 days supply | Qty: 60 | Fill #5

## 2017-06-03 MED FILL — FUSION PLUS CAPSULE: 30 days supply | Qty: 30 | Fill #0

## 2017-06-06 MED FILL — AMOXICILLIN 500 MG CAPSULE: 500 | 7 days supply | Qty: 21 | Fill #0

## 2017-06-06 MED FILL — IBUPROFEN 800 MG TABS: 800 | 6 days supply | Qty: 24 | Fill #0

## 2017-06-14 ENCOUNTER — Encounter: Payer: Self-pay | Admitting: Hematology & Oncology

## 2017-06-14 MED FILL — PIOGLITAZONE HCL 15 MG TAB: 15 | 30 days supply | Qty: 30 | Fill #2

## 2017-06-14 MED FILL — FARXIGA 10 MG TABLET: 10 | 30 days supply | Qty: 30 | Fill #3

## 2017-06-14 MED FILL — MELOXICAM 7.5 MG TABLET: 7.5 | 30 days supply | Qty: 30 | Fill #1

## 2017-07-04 MED FILL — VALACYCLOVIR HCL 500 MG TAB: 500 | 30 days supply | Qty: 36 | Fill #0

## 2017-07-04 MED FILL — GABAPENTIN 600 MG TABLET: 600 | 30 days supply | Qty: 60 | Fill #1

## 2017-07-05 MED FILL — XULTOPHY 100 UNIT-3.6MG/ML: 100-3.6 | 54 days supply | Qty: 15 | Fill #0

## 2017-07-11 MED FILL — PIOGLITAZONE HCL 15 MG TAB: 15 | 30 days supply | Qty: 30 | Fill #3

## 2017-07-11 MED FILL — MELOXICAM 7.5 MG TABLET: 7.5 | 30 days supply | Qty: 30 | Fill #3

## 2017-07-18 MED FILL — ACETAMINOPHEN/COD #3 TABLET: 300-30 | 4 days supply | Qty: 21 | Fill #0

## 2017-07-19 MED FILL — metFORMIN HCL 1000 MG TABS: 1000 | 90 days supply | Qty: 180 | Fill #0

## 2017-08-05 MED FILL — PIOGLITAZONE HCL 15 MG TAB: 15 | 30 days supply | Qty: 30 | Fill #4

## 2017-08-05 MED FILL — GABAPENTIN 600 MG TABLET: 600 | 30 days supply | Qty: 60 | Fill #1

## 2017-08-05 MED FILL — MELOXICAM 7.5 MG TABLET: 7.5 | 30 days supply | Qty: 30 | Fill #4

## 2017-08-29 DIAGNOSIS — E1140 Type 2 diabetes mellitus with diabetic neuropathy, unspecified: Secondary | ICD-10-CM | POA: Diagnosis not present

## 2017-08-29 DIAGNOSIS — D509 Iron deficiency anemia, unspecified: Secondary | ICD-10-CM | POA: Diagnosis not present

## 2017-08-29 DIAGNOSIS — E669 Obesity, unspecified: Secondary | ICD-10-CM | POA: Diagnosis not present

## 2017-08-29 DIAGNOSIS — Z6837 Body mass index (BMI) 37.0-37.9, adult: Secondary | ICD-10-CM | POA: Diagnosis not present

## 2017-08-29 LAB — IRON,TIBC AND FERRITIN PANEL
%SAT: 4
Iron: 16
TIBC: 375
UIBC: 359

## 2017-08-29 LAB — BASIC METABOLIC PANEL
BUN: 10 (ref 4–21)
Glucose: 78
Potassium: 4.3 (ref 3.4–5.3)
Sodium: 140 (ref 137–147)

## 2017-08-29 LAB — CBC AND DIFFERENTIAL
HEMATOCRIT: 37 (ref 36–46)
HEMOGLOBIN: 11.1 — AB (ref 12.0–16.0)
WBC: 17.9

## 2017-08-29 LAB — HEMOGLOBIN A1C: HEMOGLOBIN A1C: 8.1

## 2017-08-29 MED FILL — TRESIBA FLEXTOUCH 200 UNITS: 200 | 23 days supply | Qty: 9 | Fill #0

## 2017-08-29 MED FILL — FUSION PLUS CAPSULE: 30 days supply | Qty: 30 | Fill #1

## 2017-08-29 MED FILL — HUMALOG 100 UNITS/ML KWIKPE: 100 | 63 days supply | Qty: 15 | Fill #0

## 2017-08-29 MED FILL — FARXIGA 10 MG TABLET: 10 | 90 days supply | Qty: 90 | Fill #0

## 2017-09-04 MED FILL — GABAPENTIN 600 MG TAB: 600 | 30 days supply | Qty: 60 | Fill #2

## 2017-09-10 MED FILL — FREESTYLE LIBRE 14 DAY SENS: 28 days supply | Qty: 2 | Fill #0

## 2017-09-25 ENCOUNTER — Other Ambulatory Visit: Payer: Self-pay | Admitting: Family

## 2017-09-25 DIAGNOSIS — D500 Iron deficiency anemia secondary to blood loss (chronic): Secondary | ICD-10-CM

## 2017-09-25 DIAGNOSIS — N920 Excessive and frequent menstruation with regular cycle: Secondary | ICD-10-CM

## 2017-09-26 ENCOUNTER — Other Ambulatory Visit: Payer: Self-pay

## 2017-09-26 ENCOUNTER — Inpatient Hospital Stay: Payer: 59 | Attending: Family | Admitting: Family

## 2017-09-26 ENCOUNTER — Encounter: Payer: Self-pay | Admitting: Family

## 2017-09-26 ENCOUNTER — Inpatient Hospital Stay: Payer: 59

## 2017-09-26 ENCOUNTER — Telehealth: Payer: Self-pay | Admitting: Family

## 2017-09-26 VITALS — BP 128/87 | HR 95 | Temp 98.3°F | Resp 16 | Wt 189.0 lb

## 2017-09-26 DIAGNOSIS — Z808 Family history of malignant neoplasm of other organs or systems: Secondary | ICD-10-CM | POA: Insufficient documentation

## 2017-09-26 DIAGNOSIS — D72829 Elevated white blood cell count, unspecified: Secondary | ICD-10-CM | POA: Diagnosis not present

## 2017-09-26 DIAGNOSIS — D509 Iron deficiency anemia, unspecified: Secondary | ICD-10-CM | POA: Insufficient documentation

## 2017-09-26 DIAGNOSIS — E119 Type 2 diabetes mellitus without complications: Secondary | ICD-10-CM | POA: Insufficient documentation

## 2017-09-26 DIAGNOSIS — Z801 Family history of malignant neoplasm of trachea, bronchus and lung: Secondary | ICD-10-CM | POA: Diagnosis not present

## 2017-09-26 DIAGNOSIS — Z794 Long term (current) use of insulin: Secondary | ICD-10-CM | POA: Insufficient documentation

## 2017-09-26 DIAGNOSIS — D500 Iron deficiency anemia secondary to blood loss (chronic): Secondary | ICD-10-CM

## 2017-09-26 DIAGNOSIS — Z800 Family history of malignant neoplasm of digestive organs: Secondary | ICD-10-CM | POA: Insufficient documentation

## 2017-09-26 DIAGNOSIS — R197 Diarrhea, unspecified: Secondary | ICD-10-CM | POA: Insufficient documentation

## 2017-09-26 DIAGNOSIS — N920 Excessive and frequent menstruation with regular cycle: Secondary | ICD-10-CM

## 2017-09-26 DIAGNOSIS — D649 Anemia, unspecified: Secondary | ICD-10-CM

## 2017-09-26 LAB — CMP (CANCER CENTER ONLY)
ALBUMIN: 3.4 g/dL — AB (ref 3.5–5.0)
ALK PHOS: 114 U/L (ref 40–150)
ALT: 19 U/L (ref 0–55)
ANION GAP: 10 (ref 3–11)
AST: 27 U/L (ref 5–34)
BUN: 14 mg/dL (ref 7–26)
CHLORIDE: 104 mmol/L (ref 98–109)
CO2: 23 mmol/L (ref 22–29)
Calcium: 9.8 mg/dL (ref 8.4–10.4)
Creatinine: 0.81 mg/dL (ref 0.60–1.10)
GFR, Estimated: >60 mL/min (ref >60)
GLUCOSE: 198 mg/dL — AB (ref 70–140)
Potassium: 4 mmol/L (ref 3.5–5.1)
Sodium: 137 mmol/L (ref 136–145)
Total Bilirubin: <0.2 mg/dL — ABNORMAL LOW (ref 0.2–1.2)
Total Protein: 8.6 g/dL — ABNORMAL HIGH (ref 6.4–8.3)

## 2017-09-26 LAB — CBC WITH DIFFERENTIAL (CANCER CENTER ONLY)
Basophils Absolute: 0.1 10*3/uL (ref 0.0–0.1)
Basophils Relative: 0 %
EOS PCT: 2 %
Eosinophils Absolute: 0.3 10*3/uL (ref 0.0–0.5)
HCT: 36.7 % (ref 34.8–46.6)
Hemoglobin: 11.5 g/dL — ABNORMAL LOW (ref 11.6–15.9)
LYMPHS ABS: 2.7 10*3/uL (ref 0.9–3.3)
LYMPHS PCT: 16 %
MCH: 23.4 pg — AB (ref 26.0–34.0)
MCHC: 31.3 g/dL — ABNORMAL LOW (ref 32.0–36.0)
MCV: 74.7 fL — AB (ref 81.0–101.0)
Monocytes Absolute: 0.8 10*3/uL (ref 0.1–0.9)
Monocytes Relative: 5 %
NEUTROS ABS: 13 10*3/uL — AB (ref 1.5–6.5)
NEUTROS PCT: 77 %
Platelet Count: 496 10*3/uL — ABNORMAL HIGH (ref 145–400)
RBC: 4.91 MIL/uL (ref 3.70–5.32)
RDW: 16.2 % — AB (ref 11.1–15.7)
WBC: 16.9 10*3/uL — AB (ref 3.9–10.0)

## 2017-09-26 LAB — RETICULOCYTES
RBC.: 4.82 MIL/uL (ref 3.70–5.45)
RETIC CT PCT: 1.1 % (ref 0.7–2.1)
Retic Count, Absolute: 53 10*3/uL (ref 33.7–90.7)

## 2017-09-26 LAB — IRON AND TIBC
IRON: 22 ug/dL — AB (ref 41–142)
SATURATION RATIOS: 6 % — AB (ref 21–57)
TIBC: 369 ug/dL (ref 236–444)
UIBC: 346 ug/dL

## 2017-09-26 LAB — LACTATE DEHYDROGENASE: LDH: 194 U/L (ref 125–245)

## 2017-09-26 LAB — SAVE SMEAR

## 2017-09-26 LAB — FERRITIN: FERRITIN: 38 ng/mL (ref 9–269)

## 2017-09-26 NOTE — Telephone Encounter (Signed)
Left message with call back number for results and to discuss her first iron infusion.

## 2017-09-26 NOTE — Progress Notes (Signed)
Hematology/Oncology Consultation   Name: Michelle Tanner      MRN: [id number]    Location: Room/bed info not found  Date: 09/26/2017 Time:9:28 AM   REFERRING PHYSICIAN: Glendale Chard, MD  REASON FOR CONSULT: Iron deficiency anemia    DIAGNOSIS: Iron deficiency anemia   HISTORY OF PRESENT ILLNESS: Michelle Tanner is a very pleasant 47 yo African American female with iron deficiency anemia. Her iron saturation in January was 5% and ferritin 32. Hgb is 11.5, MCV 74 and platelet count 496. Her WBC count is elevated at 16.9.  She is taking a Fusion Plus iron supplement daily.  She has poorly controlled diabetes and has recently started [place], Ozempic and Humalog.   She is symptomatic with fatigue and chills. She was chewing ice but had to stop because of her weak teeth.  No family history of anemia that she knows of. No personal or familial history of sickle cell disease or trait that she is aware of.  Mother has sarcoidosis.  No personal history of cancer. Paternal grandmother - colorectal, mother - esophageal (smoker), uncle - lung.  She has had a C-section and exploratory lap where she had polys and scar tissue removed from the abdominal wall due to her previous C-section. She denies having had any complications with either of these procedures.  No fever, n/v, cough, rash, dizziness, SOB, chest pain, palpitations, abdominal pain or changes in bowel or bladder habits.  She will have diarrhea every 3-4 days and will sometimes note a little bright red blood on her toilet tissue. She states that her PCP is going to refer her to GI.  Her appetite comes and goes based on her GI habits. She will have bloating at times.  She is hydrating well and states that her weight is stable.  She had a uterine ablation in January 2018 to stop her heavy cycles. Her cycle is now light and only lasts 1-2 days. She has cramping and clots with each cycle.  No swelling or tenderness in her extremities at this time. She  will occasionally have "puffiness" in her wrists. She has chronic numbness and tingling in her left foot due to lower back issues. 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  She does not smoke and only has the occasional alcoholic beverage socially.   She works full time as [employer] fundraiser.   ROS: All other 10 point review of systems is negative.   PAST MEDICAL HISTORY:   Past Medical History:  Diagnosis Date  . Diabetes mellitus   . Frozen shoulder    left  . Hx of migraine headaches   . Migraines   . Neuromuscular disorder ([place])    diabetic neuropathy    ALLERGIES: Allergies  Allergen Reactions  . Latex       MEDICATIONS:  Current Outpatient Medications on File Prior to Visit  Medication Sig Dispense Refill  . Aspirin-Salicylamide-Caffeine (BC HEADACHE POWDER PO) Take 1 packet by mouth 2 (two) times daily as needed. For pain    . dapagliflozin propanediol (FARXIGA) 10 MG TABS tablet Take 10 mg by mouth daily.    . diclofenac (VOLTAREN) 75 MG EC tablet Take 75 mg by mouth 2 (two) times daily as needed.    . eszopiclone (LUNESTA) 1 MG TABS tablet Take 3 mg by mouth at bedtime as needed for sleep. Take immediately before bedtime    . FeFum-FePoly-FA-B Cmp-C-Biot (FOLIVANE-PLUS PO) Take 1 capsule by mouth daily.    . fluconazole (DIFLUCAN) 150 MG  tablet 1 po each day for 3 days 3 tablet 1  . gabapentin (NEURONTIN) 600 MG tablet Take 600 mg by mouth 2 (two) times daily.    Marland Kitchen ibuprofen (ADVIL,MOTRIN) 800 MG tablet Take 800 mg by mouth every 8 (eight) hours as needed. For pain    . insulin detemir (LEVEMIR) 100 UNIT/ML injection Inject 48 Units into the skin at bedtime.    . lidocaine (XYLOCAINE) 5 % ointment Apply topically as needed. 35.44 g 0  . Liraglutide -Weight Management (SAXENDA) 18 MG/3ML SOPN Inject 5 mg into the skin daily.    . metFORMIN (GLUCOPHAGE) 1000 MG tablet Take 1,000 mg by mouth 2 (two) times daily with a meal.    . Multiple Vitamin (MULITIVITAMIN WITH MINERALS)  TABS Take 1 tablet by mouth daily.    . naproxen sodium (ANAPROX) 220 MG tablet Take 440-660 mg by mouth 2 (two) times daily as needed. For pain    . nystatin-triamcinolone (MYCOLOG II) cream Apply topically 4 (four) times daily. 30 g 0  . pioglitazone (ACTOS) 15 MG tablet Take 15 mg by mouth daily.    . SUMAtriptan (IMITREX) 50 MG tablet Take 50 mg by mouth every 2 (two) hours as needed.    . Vitamin D, Ergocalciferol, (DRISDOL) 50000 UNITS CAPS Take 50,000 Units by mouth.     No current facility-administered medications on file prior to visit.      PAST SURGICAL HISTORY Past Surgical History:  Procedure Laterality Date  . ABLATION    . CESAREAN SECTION    . LAPAROSCOPIC ENDOMETRIOSIS FULGURATION    . POLYPECTOMY     Reports either [place]var[place] or Uterine.   . WISDOM TOOTH EXTRACTION      FAMILY HISTORY: Family History  Problem Relation Age of Onset  . Rectal cancer Paternal Grandmother   . Diabetes Father   . Kidney disease Father   . CAD Father   . Cancer Mother   . Cancer Paternal Uncle   . Cancer Paternal Aunt     SOCIAL HISTORY:  reports that she has never smoked. She has never used smokeless tobacco. She reports that she drinks alcohol. She reports that she does not use drugs.  PERFORMANCE STATUS: The patient's performance status is 1 - Symptomatic but completely ambulatory  PHYSICAL EXAM: Most Recent Vital Signs: There were no vitals taken for this visit. BP 128/87 (BP Location: Left Arm, Patient Position: Sitting)   Pulse 95   Temp 98.3 F (36.8 C) (Oral)   Resp 16   Wt 189 lb (85.7 kg)   SpO2 99%   BMI 36.91 kg/m   General Appearance:    Alert, cooperative, no distress, appears stated age  Head:    Normocephalic, without obvious abnormality, atraumatic  Eyes:    PERRL, conjunctiva/corneas clear, EOM's intact, fundi    benign, both eyes        Throat:   Lips, mucosa, and tongue normal; teeth and gums normal  Neck:   Supple, symmetrical, trachea midline,  no adenopathy;    thyroid:  no enlargement/tenderness/nodules; no carotid   bruit or JVD  Back:     Symmetric, no curvature, ROM normal, no CVA tenderness  Lungs:     Clear to auscultation bilaterally, respirations unlabored  Chest Wall:    No tenderness or deformity   Heart:    Regular rate and rhythm, S1 and S2 normal, no murmur, rub   or gallop     Abdomen:     Soft, non-tender,  bowel sounds active all four quadrants,    no masses, no organomegaly        Extremities:   Extremities normal, atraumatic, no cyanosis or edema  Pulses:   2+ and symmetric all extremities  Skin:   Skin color, texture, turgor normal, no rashes or lesions  Lymph nodes:   Cervical, supraclavicular, and axillary nodes normal  Neurologic:   CNII-XII intact, normal strength, sensation and reflexes    throughout    LABORATORY DATA:  Results for orders placed or performed in visit on 09/26/17 (from the past 48 hour(s))  CBC with Differential ([facility] Only)     Status: Abnormal   Collection Time: 09/26/17  8:51 AM  Result Value Ref Range   WBC Count 16.9 (H) 3.9 - 10.0 K/uL   RBC 4.91 3.70 - 5.32 MIL/uL   Hemoglobin 11.5 (L) 11.6 - 15.9 g/dL   HCT 36.7 34.8 - 46.6 %   MCV 74.7 (L) 81.0 - 101.0 fL   MCH 23.4 (L) 26.0 - 34.0 pg   MCHC 31.3 (L) 32.0 - 36.0 g/dL   RDW 16.2 (H) 11.1 - 15.7 %   Platelet Count 496 (H) 145 - 400 K/uL   Neutrophils Relative % 77 %   Neutro Abs 13.0 (H) 1.5 - 6.5 K/uL   Lymphocytes Relative 16 %   Lymphs Abs 2.7 0.9 - 3.3 K/uL   Monocytes Relative 5 %   Monocytes Absolute 0.8 0.1 - 0.9 K/uL   Eosinophils Relative 2 %   Eosinophils Absolute 0.3 0.0 - 0.5 K/uL   Basophils Relative 0 %   Basophils Absolute 0.1 0.0 - 0.1 K/uL    Comment: Performed at [facility], [address] [place], [place] [address]      RADIOGRAPHY: No results found.     PATHOLOGY: None  ASSESSMENT/PLAN: Ms. Krauss is a very pleasant 47 yo African  American female with iron deficiency anemia. She had an ablation in 2018 and her cycles have since decreased to 1-2 days a month. She is doing well so far on fusion plus but is still having some fatigue and chills.  We will see what her iron studies look like and determine whether to keep her on the oral supplement or do an iron infusion.  We will go ahead and plan to see her back in another 3 months for follow-up.   All questions were answered and she is in agreement with the plan. She will contact our office with any questions or concerns. We can certainly see him much sooner if needed.  She was discussed with and also seen by Dr. Marin Olp and he is in agreement with the aforementioned.   Laverna Peace     Addendum: I saw and examined the patient with Judson Roch.  I agree with her above assessment.  I looked at the blood smear under the microscope.  She did have some slight microcytic and hypochromic red blood cells.  There were no nucleated red blood cells.  The platelets were on the high side.  She had no rouleaux formation.  She had no immature myeloid or lymphoid cells.  She had no hypersegmented polys.  She seems to be doing well with the oral iron.  However, we will see what her iron studies show.  She still has some fatigue.  It is possible that she might need IV iron to try to "perk herself up."  We spent about 40 minutes with  her.  All the time was spent face-to-face.  We answered her questions.  We reviewed her lab work with her.  We reassured her that we thought that she would be fine.  She is really funny.  It was really nice talking to her.  She is a [employer] fundraiser.  She really has a tough job but I am sure she is very good at what she does.  Lattie Haw, MD

## 2017-09-27 LAB — ERYTHROPOIETIN: ERYTHROPOIETIN: 27.8 m[IU]/mL — AB (ref 2.6–18.5)

## 2017-10-03 MED FILL — GABAPENTIN 600 MG TAB: 600 | 30 days supply | Qty: 60 | Fill #3

## 2017-10-04 MED FILL — UNIFINE PENTIPS 31GX3/16: 31G X 5 MM | 90 days supply | Qty: 100 | Fill #0

## 2017-10-04 MED FILL — UNIFINE PENTIPS 31GX3/16": 31G X 5 MM | 90 days supply | Qty: 100 | Fill #0

## 2017-10-08 ENCOUNTER — Inpatient Hospital Stay: Payer: 59

## 2017-10-08 ENCOUNTER — Other Ambulatory Visit: Payer: Self-pay | Admitting: Family

## 2017-10-08 VITALS — BP 127/77

## 2017-10-08 DIAGNOSIS — Z801 Family history of malignant neoplasm of trachea, bronchus and lung: Secondary | ICD-10-CM | POA: Diagnosis not present

## 2017-10-08 DIAGNOSIS — R197 Diarrhea, unspecified: Secondary | ICD-10-CM | POA: Diagnosis not present

## 2017-10-08 DIAGNOSIS — D500 Iron deficiency anemia secondary to blood loss (chronic): Secondary | ICD-10-CM

## 2017-10-08 DIAGNOSIS — D72829 Elevated white blood cell count, unspecified: Secondary | ICD-10-CM | POA: Diagnosis not present

## 2017-10-08 DIAGNOSIS — D509 Iron deficiency anemia, unspecified: Secondary | ICD-10-CM | POA: Diagnosis not present

## 2017-10-08 DIAGNOSIS — Z800 Family history of malignant neoplasm of digestive organs: Secondary | ICD-10-CM | POA: Diagnosis not present

## 2017-10-08 DIAGNOSIS — Z794 Long term (current) use of insulin: Secondary | ICD-10-CM | POA: Diagnosis not present

## 2017-10-08 DIAGNOSIS — E119 Type 2 diabetes mellitus without complications: Secondary | ICD-10-CM | POA: Diagnosis not present

## 2017-10-08 DIAGNOSIS — Z808 Family history of malignant neoplasm of other organs or systems: Secondary | ICD-10-CM | POA: Diagnosis not present

## 2017-10-08 MED ORDER — SODIUM CHLORIDE 0.9 % IV SOLN
Freq: Once | INTRAVENOUS | Status: AC
  Administered 2017-10-08: 13:00:00 via INTRAVENOUS

## 2017-10-08 MED ORDER — SODIUM CHLORIDE 0.9 % IV SOLN
510.0000 mg | Freq: Once | INTRAVENOUS | Status: AC
  Administered 2017-10-08: 510 mg via INTRAVENOUS
  Filled 2017-10-08: qty 17

## 2017-10-11 MED FILL — FUSION PLUS CAPSULE: 30 days supply | Qty: 30 | Fill #0

## 2017-10-15 DIAGNOSIS — Z1389 Encounter for screening for other disorder: Secondary | ICD-10-CM | POA: Diagnosis not present

## 2017-10-15 DIAGNOSIS — Z794 Long term (current) use of insulin: Secondary | ICD-10-CM | POA: Diagnosis not present

## 2017-10-15 DIAGNOSIS — E1140 Type 2 diabetes mellitus with diabetic neuropathy, unspecified: Secondary | ICD-10-CM | POA: Diagnosis not present

## 2017-10-15 DIAGNOSIS — Z6837 Body mass index (BMI) 37.0-37.9, adult: Secondary | ICD-10-CM | POA: Diagnosis not present

## 2017-10-15 DIAGNOSIS — L84 Corns and callosities: Secondary | ICD-10-CM | POA: Diagnosis not present

## 2017-10-15 MED FILL — FREESTYLE LIBRE 14 DAY SENS: 28 days supply | Qty: 2 | Fill #1

## 2017-10-17 MED FILL — FREESTYLE LIBRE 14 DAY READ: 1 days supply | Qty: 1 | Fill #0

## 2017-10-24 MED FILL — MELOXICAM 7.5 MG TABLET: 7.5 | 30 days supply | Qty: 30 | Fill #5

## 2017-10-24 MED FILL — PIOGLITAZONE HCL 15 MG TAB: 15 | 30 days supply | Qty: 30 | Fill #5

## 2017-11-01 MED FILL — GABAPENTIN 600 MG TAB: 600 | 30 days supply | Qty: 60 | Fill #0

## 2017-11-07 DIAGNOSIS — Z01419 Encounter for gynecological examination (general) (routine) without abnormal findings: Secondary | ICD-10-CM | POA: Diagnosis not present

## 2017-11-07 DIAGNOSIS — Z6834 Body mass index (BMI) 34.0-34.9, adult: Secondary | ICD-10-CM | POA: Diagnosis not present

## 2017-11-07 DIAGNOSIS — Z1231 Encounter for screening mammogram for malignant neoplasm of breast: Secondary | ICD-10-CM | POA: Diagnosis not present

## 2017-11-18 MED FILL — TRESIBA FLEXTOUCH 200 UNITS: 200 | 23 days supply | Qty: 9 | Fill #1

## 2017-11-22 MED FILL — metFORMIN HCL 1000 MG TABS: 1000 | 90 days supply | Qty: 180 | Fill #1

## 2017-11-22 MED FILL — PIOGLITAZONE HCL 15 MG TAB: 15 | 30 days supply | Qty: 30 | Fill #0

## 2017-11-22 MED FILL — MELOXICAM 7.5 MG TABLET: 7.5 | 30 days supply | Qty: 30 | Fill #0

## 2017-12-03 DIAGNOSIS — R000 Tachycardia, unspecified: Secondary | ICD-10-CM | POA: Diagnosis not present

## 2017-12-03 DIAGNOSIS — Z6839 Body mass index (BMI) 39.0-39.9, adult: Secondary | ICD-10-CM | POA: Diagnosis not present

## 2017-12-03 DIAGNOSIS — E1140 Type 2 diabetes mellitus with diabetic neuropathy, unspecified: Secondary | ICD-10-CM | POA: Diagnosis not present

## 2017-12-03 DIAGNOSIS — E669 Obesity, unspecified: Secondary | ICD-10-CM | POA: Diagnosis not present

## 2017-12-03 MED FILL — GABAPENTIN 600 MG TAB: 600 | 30 days supply | Qty: 60 | Fill #1

## 2017-12-26 ENCOUNTER — Encounter: Payer: Self-pay | Admitting: Family

## 2017-12-26 ENCOUNTER — Inpatient Hospital Stay: Payer: 59 | Attending: Family | Admitting: Family

## 2017-12-26 ENCOUNTER — Inpatient Hospital Stay: Payer: 59

## 2017-12-26 ENCOUNTER — Other Ambulatory Visit: Payer: Self-pay

## 2017-12-26 VITALS — BP 135/76 | HR 87 | Temp 98.2°F | Resp 16 | Wt 198.0 lb

## 2017-12-26 DIAGNOSIS — D500 Iron deficiency anemia secondary to blood loss (chronic): Secondary | ICD-10-CM

## 2017-12-26 DIAGNOSIS — D509 Iron deficiency anemia, unspecified: Secondary | ICD-10-CM | POA: Insufficient documentation

## 2017-12-26 DIAGNOSIS — R200 Anesthesia of skin: Secondary | ICD-10-CM | POA: Insufficient documentation

## 2017-12-26 DIAGNOSIS — D649 Anemia, unspecified: Secondary | ICD-10-CM

## 2017-12-26 LAB — CMP (CANCER CENTER ONLY)
ALT: 24 U/L (ref 10–47)
AST: 24 U/L (ref 11–38)
Albumin: 3.2 g/dL — ABNORMAL LOW (ref 3.5–5.0)
Alkaline Phosphatase: 113 U/L — ABNORMAL HIGH (ref 26–84)
Anion gap: 4 — ABNORMAL LOW (ref 5–15)
BUN: 9 mg/dL (ref 7–22)
CHLORIDE: 105 mmol/L (ref 98–108)
CO2: 26 mmol/L (ref 18–33)
Calcium: 9.5 mg/dL (ref 8.0–10.3)
Creatinine: 0.6 mg/dL (ref 0.60–1.20)
GLUCOSE: 284 mg/dL — AB (ref 73–118)
POTASSIUM: 3.8 mmol/L (ref 3.3–4.7)
SODIUM: 135 mmol/L (ref 128–145)
Total Bilirubin: 0.4 mg/dL (ref 0.2–1.6)
Total Protein: 7.8 g/dL (ref 6.4–8.1)

## 2017-12-26 LAB — CBC WITH DIFFERENTIAL (CANCER CENTER ONLY)
Basophils Absolute: 0.1 10*3/uL (ref 0.0–0.1)
Basophils Relative: 0 %
EOS ABS: 0.4 10*3/uL (ref 0.0–0.5)
EOS PCT: 3 %
HCT: 39.6 % (ref 34.8–46.6)
Hemoglobin: 12.3 g/dL (ref 11.6–15.9)
LYMPHS ABS: 2.5 10*3/uL (ref 0.9–3.3)
Lymphocytes Relative: 17 %
MCH: 24.6 pg — AB (ref 26.0–34.0)
MCHC: 31.1 g/dL — AB (ref 32.0–36.0)
MCV: 79.4 fL — ABNORMAL LOW (ref 81.0–101.0)
MONOS PCT: 4 %
Monocytes Absolute: 0.7 10*3/uL (ref 0.1–0.9)
Neutro Abs: 11.7 10*3/uL — ABNORMAL HIGH (ref 1.5–6.5)
Neutrophils Relative %: 76 %
PLATELETS: 361 10*3/uL (ref 145–400)
RBC: 4.99 MIL/uL (ref 3.70–5.32)
RDW: 16.3 % — AB (ref 11.1–15.7)
WBC Count: 15.4 10*3/uL — ABNORMAL HIGH (ref 3.9–10.0)

## 2017-12-26 MED FILL — FARXIGA 10 MG TABLET: 10 | 90 days supply | Qty: 90 | Fill #1

## 2017-12-26 MED FILL — MELOXICAM 7.5 MG TABLET: 7.5 | 30 days supply | Qty: 30 | Fill #1

## 2017-12-26 MED FILL — PIOGLITAZONE HCL 15 MG TAB: 15 | 30 days supply | Qty: 30 | Fill #1

## 2017-12-26 MED FILL — FUSION PLUS CAPSULE: 30 days supply | Qty: 30 | Fill #1

## 2017-12-26 NOTE — Progress Notes (Addendum)
Hematology and Oncology Follow Up Visit  ELLASYN SWILLING [id number] [birth year] 47 y.o. 12/26/2017   Principle Diagnosis:  Iron deficiency anemia  Alpha thalassemia  Current Therapy:   IV iron as indicated - last received in May 0370 Folic acid 1 mg PO daily   Interim History: Ms. Runner is here today for follow-up. She received IV iron in May for iron saturation of 6% and ferritin 38. She states that she can not tell much difference in her symptoms. She still has fatigue and craves ice but does not chew it because she does not want to hurt her teeth.  She has had some dizziness at times. No falls or syncopal episodes to report.  She has IBS and had 2 flares in the last 1 1/2 months.  She has not noted any blood loss other than with cycle (unchanged), no bruising or petechiae.  No lymphadenopathy noted on her exam.  No fever, chills, n/v, cough, rash, SOB, chest pain, palpitations, abdominal pain or changes in bowel or bladder habits.  No swelling or tenderness in her extremities. She has intermittent numbness and tingling in her lower extremities and left hand.  She has maintained a good appetite and is hydrating well. Her weight is stable.   ECOG Performance Status: 1 - Symptomatic but completely ambulatory  Medications:  Allergies as of 12/26/2017      Reactions   Latex Hives      Medication List        Accurate as of 12/26/17 11:25 AM. Always use your most recent med list.          FARXIGA 10 MG Tabs tablet Generic drug:  dapagliflozin propanediol Take 10 mg by mouth daily.   FOLIVANE-PLUS PO Take 1 capsule by mouth daily.   FUSION PLUS Caps Take by mouth daily.   gabapentin 600 MG tablet Commonly known as:  NEURONTIN Take 600 mg by mouth 2 (two) times daily.   ibuprofen 800 MG tablet Commonly known as:  ADVIL,MOTRIN Take 800 mg by mouth every 8 (eight) hours as needed. For pain   meloxicam 7.5 MG tablet Commonly known as:  MOBIC Take 7.5 mg by mouth  daily.   metFORMIN 1000 MG tablet Commonly known as:  GLUCOPHAGE Take 1,000 mg by mouth 2 (two) times daily with a meal.   multivitamin with minerals Tabs tablet Take 1 tablet by mouth daily.   NON FORMULARY   nystatin-triamcinolone cream Commonly known as:  MYCOLOG II Apply topically 4 (four) times daily.   OMEGA 3 500 PO Take by mouth.   OZEMPIC 0.25 or 0.5 MG/DOSE Sopn Generic drug:  Semaglutide Inject 0.5 mg into the skin once a week. 0.5 mg every Thursday for 6 weeks.   pioglitazone 15 MG tablet Commonly known as:  ACTOS Take 15 mg by mouth daily.   SUMAtriptan 50 MG tablet Commonly known as:  IMITREX Take 50 mg by mouth every 2 (two) hours as needed.   TRESIBA FLEXTOUCH 200 UNIT/ML Sopn Generic drug:  Insulin Degludec Inject 30 Units into the skin at bedtime.   valACYclovir 500 MG tablet Commonly known as:  VALTREX Take 500 mg by mouth 3 (three) times daily.   Vitamin D (Ergocalciferol) 50000 units Caps capsule Commonly known as:  DRISDOL Take 50,000 Units by mouth.       Allergies:  Allergies  Allergen Reactions  . Latex Hives    Past Medical History, Surgical history, Social history, and Family History were reviewed and updated.  Review of Systems: All other 10 point review of systems is negative.   Physical Exam:  vitals were not taken for this visit.   Wt Readings from Last 3 Encounters:  09/26/17 189 lb (85.7 kg)  07/13/16 193 lb (87.5 kg)  05/03/15 190 lb 7.6 oz (86.4 kg)    Ocular: Sclerae unicteric, pupils equal, round and reactive to light Ear-nose-throat: Oropharynx clear, dentition fair Lymphatic: No cervical, supraclavicular or axillary adenopathy Lungs no rales or rhonchi, good excursion bilaterally Heart regular rate and rhythm, no murmur appreciated Abd soft, nontender, positive bowel sounds, no liver or spleen tip palpated on exam, no fluid wave  MSK no focal spinal tenderness, no joint edema Neuro: non-focal,  well-oriented, appropriate affect Breasts: Deferred   Lab Results  Component Value Date   WBC 15.4 (H) 12/26/2017   HGB 12.3 12/26/2017   HCT 39.6 12/26/2017   MCV 79.4 (L) 12/26/2017   PLT 361 12/26/2017   Lab Results  Component Value Date   FERRITIN 38 09/26/2017   IRON 22 (L) 09/26/2017   TIBC 369 09/26/2017   UIBC 346 09/26/2017   IRONPCTSAT 6 (L) 09/26/2017   Lab Results  Component Value Date   RETICCTPCT 1.1 09/26/2017   RBC 4.99 12/26/2017   No results found for: KPAFRELGTCHN, LAMBDASER, KAPLAMBRATIO No results found for: IGGSERUM, IGA, IGMSERUM No results found for: Odetta Pink, SPEI   Chemistry      Component Value Date/Time   NA 137 09/26/2017 0851   K 4.0 09/26/2017 0851   CL 104 09/26/2017 0851   CO2 23 09/26/2017 0851   BUN 14 09/26/2017 0851   CREATININE 0.81 09/26/2017 0851      Component Value Date/Time   CALCIUM 9.8 09/26/2017 0851   ALKPHOS 114 09/26/2017 0851   AST 27 09/26/2017 0851   ALT 19 09/26/2017 0851   BILITOT <0.2 (L) 09/26/2017 0851      Impression and Plan: Ms. Simoneaux is a very pleasant 47 yo African American female with iron deficiency anemia. She had a uterine ablation in 2018 which has helped ease her cycle. She received a dose of IV iron in May but is still symptomatic as mentioned above.  We will see what her iron studies show and bring her back in for infusion if needed.  We will go ahead and plan to see her back in another 3 months for follow-up.  She will contact our office with any questions or concerns. We can certainly see her sooner if need be.   Laverna Peace, NP 8/8/201911:25 AM

## 2017-12-27 LAB — HEMOGLOBINOPATHY EVALUATION
HGB A: 98.1 % (ref 96.4–98.8)
HGB C: 0 %
HGB S QUANTITAION: 0 %
Hgb A2 Quant: 1.9 % (ref 1.8–3.2)
Hgb F Quant: 0 % (ref 0.0–2.0)
Hgb Variant: 0 %

## 2017-12-27 LAB — IRON AND TIBC
IRON: 23 ug/dL — AB (ref 41–142)
Saturation Ratios: 8 % — ABNORMAL LOW (ref 21–57)
TIBC: 311 ug/dL (ref 236–444)
UIBC: 288 ug/dL

## 2017-12-27 LAB — FERRITIN: FERRITIN: 52 ng/mL (ref 11–307)

## 2017-12-30 MED FILL — TRESIBA FLEXTOUCH 200 UNITS: 200 | 23 days supply | Qty: 9 | Fill #0

## 2018-01-02 ENCOUNTER — Inpatient Hospital Stay: Payer: 59

## 2018-01-02 MED FILL — UNIFINE PENTIPS 31GX3/16: 31G X 5 MM | 90 days supply | Qty: 100 | Fill #1

## 2018-01-02 MED FILL — GABAPENTIN 600 MG TABS: 600 | 30 days supply | Qty: 60 | Fill #2

## 2018-01-02 MED FILL — OZEMPIC 0.25 OR 0.5 MG/DOSE: 2 | 28 days supply | Qty: 2 | Fill #0

## 2018-01-02 MED FILL — UNIFINE PENTIPS 31GX3/16": 31G X 5 MM | 90 days supply | Qty: 100 | Fill #1

## 2018-01-06 ENCOUNTER — Inpatient Hospital Stay: Payer: 59

## 2018-01-06 VITALS — BP 123/87 | HR 99 | Temp 98.2°F | Resp 17

## 2018-01-06 DIAGNOSIS — D509 Iron deficiency anemia, unspecified: Secondary | ICD-10-CM | POA: Diagnosis not present

## 2018-01-06 DIAGNOSIS — R200 Anesthesia of skin: Secondary | ICD-10-CM | POA: Diagnosis not present

## 2018-01-06 DIAGNOSIS — D500 Iron deficiency anemia secondary to blood loss (chronic): Secondary | ICD-10-CM

## 2018-01-06 MED ORDER — SODIUM CHLORIDE 0.9 % IV SOLN
510.0000 mg | Freq: Once | INTRAVENOUS | Status: AC
  Administered 2018-01-06: 510 mg via INTRAVENOUS
  Filled 2018-01-06: qty 17

## 2018-01-06 MED ORDER — SODIUM CHLORIDE 0.9 % IV SOLN
Freq: Once | INTRAVENOUS | Status: AC
  Administered 2018-01-06: 10:00:00 via INTRAVENOUS
  Filled 2018-01-06: qty 250

## 2018-01-06 NOTE — Patient Instructions (Signed)

## 2018-01-08 ENCOUNTER — Telehealth: Payer: Self-pay | Admitting: Family

## 2018-01-08 ENCOUNTER — Other Ambulatory Visit: Payer: Self-pay | Admitting: Family

## 2018-01-08 DIAGNOSIS — D560 Alpha thalassemia: Secondary | ICD-10-CM

## 2018-01-08 MED ORDER — FOLIC ACID 1 MG PO TABS: 1.0000 mg | ORAL_TABLET | Freq: Every day | ORAL | 11 refills | Status: DC

## 2018-01-08 NOTE — Telephone Encounter (Signed)
I spoke with Michelle Tanner and ant went over her alpha-thalassemia genotype result with her and we will have her start folic acid 1 mg PO daily. She is in agreement with this and we will plan to see her back for her follow-up in September.

## 2018-01-09 ENCOUNTER — Ambulatory Visit: Payer: 59

## 2018-01-13 ENCOUNTER — Inpatient Hospital Stay: Payer: 59

## 2018-01-13 ENCOUNTER — Other Ambulatory Visit: Payer: Self-pay

## 2018-01-13 VITALS — BP 121/69 | HR 88 | Temp 98.6°F | Resp 16

## 2018-01-13 DIAGNOSIS — D509 Iron deficiency anemia, unspecified: Secondary | ICD-10-CM | POA: Diagnosis not present

## 2018-01-13 DIAGNOSIS — R200 Anesthesia of skin: Secondary | ICD-10-CM | POA: Diagnosis not present

## 2018-01-13 DIAGNOSIS — D560 Alpha thalassemia: Secondary | ICD-10-CM

## 2018-01-13 DIAGNOSIS — D500 Iron deficiency anemia secondary to blood loss (chronic): Secondary | ICD-10-CM

## 2018-01-13 MED ORDER — SODIUM CHLORIDE 0.9 % IV SOLN
INTRAVENOUS | Status: DC
  Administered 2018-01-13: 14:00:00 via INTRAVENOUS
  Filled 2018-01-13: qty 250

## 2018-01-13 MED ORDER — FOLIC ACID 1 MG PO TABS: 1.0000 mg | ORAL_TABLET | Freq: Every day | ORAL | 11 refills | Status: DC

## 2018-01-13 MED ORDER — SODIUM CHLORIDE 0.9 % IV SOLN
510.0000 mg | Freq: Once | INTRAVENOUS | Status: AC
  Administered 2018-01-13: 510 mg via INTRAVENOUS
  Filled 2018-01-13: qty 17

## 2018-01-13 MED FILL — FOLIC ACID 1 MG TABS: 1 | 30 days supply | Qty: 30 | Fill #0

## 2018-01-13 NOTE — Patient Instructions (Signed)

## 2018-01-14 LAB — ALPHA-THALASSEMIA GENOTYPR

## 2018-01-30 MED FILL — PIOGLITAZONE HCL 15 MG TAB: 15 | 30 days supply | Qty: 30 | Fill #2

## 2018-01-30 MED FILL — GABAPENTIN 600 MG TABLET: 600 | 30 days supply | Qty: 60 | Fill #3

## 2018-02-04 ENCOUNTER — Other Ambulatory Visit: Payer: 59

## 2018-02-04 ENCOUNTER — Ambulatory Visit: Payer: 59 | Admitting: Family

## 2018-02-26 ENCOUNTER — Other Ambulatory Visit: Payer: Self-pay | Admitting: Internal Medicine

## 2018-02-26 MED FILL — MELOXICAM 7.5 MG TABLET: 7.5 | 30 days supply | Qty: 30 | Fill #2

## 2018-02-26 MED FILL — FOLIC ACID 1 MG TABS: 1 | 30 days supply | Qty: 30 | Fill #1

## 2018-02-26 MED FILL — OZEMPIC 0.25 OR 0.5 MG/DOSE: 2 | 28 days supply | Qty: 2 | Fill #1

## 2018-02-27 MED FILL — FUSION PLUS CAPSULE: 30 days supply | Qty: 30 | Fill #0

## 2018-02-27 MED FILL — metFORMIN HCL 1000 MG TABS: 1000 | 90 days supply | Qty: 180 | Fill #0

## 2018-02-27 MED FILL — GABAPENTIN 600 MG TABLET: 600 | 30 days supply | Qty: 60 | Fill #4

## 2018-03-10 ENCOUNTER — Encounter: Payer: Self-pay | Admitting: Internal Medicine

## 2018-03-10 DIAGNOSIS — R000 Tachycardia, unspecified: Secondary | ICD-10-CM

## 2018-03-13 ENCOUNTER — Ambulatory Visit: Payer: 59 | Admitting: Internal Medicine

## 2018-03-13 ENCOUNTER — Encounter: Payer: Self-pay | Admitting: Internal Medicine

## 2018-03-13 VITALS — BP 118/76 | HR 97 | Temp 98.1°F | Ht 60.0 in | Wt 197.2 lb

## 2018-03-13 DIAGNOSIS — M545 Low back pain, unspecified: Secondary | ICD-10-CM

## 2018-03-13 DIAGNOSIS — R202 Paresthesia of skin: Secondary | ICD-10-CM

## 2018-03-13 DIAGNOSIS — R200 Anesthesia of skin: Secondary | ICD-10-CM

## 2018-03-13 DIAGNOSIS — G8929 Other chronic pain: Secondary | ICD-10-CM

## 2018-03-13 DIAGNOSIS — E1141 Type 2 diabetes mellitus with diabetic mononeuropathy: Secondary | ICD-10-CM | POA: Diagnosis not present

## 2018-03-13 DIAGNOSIS — D473 Essential (hemorrhagic) thrombocythemia: Secondary | ICD-10-CM | POA: Diagnosis not present

## 2018-03-13 MED FILL — FREESTYLE LIBRE 14 DAY SENS: 28 days supply | Qty: 2 | Fill #2

## 2018-03-13 MED FILL — TRESIBA FLEXTOUCH 200 UNITS: 200 | 23 days supply | Qty: 9 | Fill #1

## 2018-03-13 MED FILL — PIOGLITAZONE HCL 15 MG TAB: 15 | 30 days supply | Qty: 30 | Fill #3

## 2018-03-13 NOTE — Patient Instructions (Signed)
Diabetic Neuropathy Diabetic neuropathy is a nerve disease or nerve damage that is caused by diabetes mellitus. About half of all people with diabetes mellitus have some form of nerve damage. Nerve damage is more common in those who have had diabetes mellitus for many years and who generally have not had good control of their blood sugar (glucose) level. Diabetic neuropathy is a common complication of diabetes mellitus. There are three common types of diabetic neuropathy and a fourth type that is less common and less understood:  Peripheral neuropathy-This is the most common type of diabetic neuropathy. It causes damage to the nerves of the feet and legs first and then eventually the hands and arms. The damage affects the ability to sense touch.  Autonomic neuropathy-This type causes damage to the autonomic nervous system, which controls the following functions: ? Heartbeat. ? Body temperature. ? Blood pressure. ? Urination. ? Digestion. ? Sweating. ? Sexual function.  Focal neuropathy-Focal neuropathy can be painful and unpredictable and occurs most often in older adults with diabetes mellitus. It involves a specific nerve or one area and often comes on suddenly. It usually does not cause long-term problems.  Radiculoplexus neuropathy- Sometimes called lumbosacral radiculoplexus neuropathy, radiculoplexus neuropathy affects the nerves of the thighs, hips, buttocks, or legs. It is more common in people with type 2 diabetes mellitus and in older men. It is characterized by debilitating pain, weakness, and atrophy, usually in the thigh muscles.  What are the causes? The cause of peripheral, autonomic, and focal neuropathies is diabetes mellitus that is uncontrolled and high glucose levels. The cause of radiculoplexus neuropathy is unknown. However, it is thought to be caused by inflammation related to uncontrolled glucose levels. What are the signs or symptoms? Peripheral Neuropathy Peripheral  neuropathy develops slowly over time. When the nerves of the feet and legs no longer work there may be:  Burning, stabbing, or aching pain in the legs or feet.  Inability to feel pressure or pain in your feet. This can lead to: ? Thick calluses over pressure areas. ? Pressure sores. ? Ulcers.  Foot deformities.  Reduced ability to feel temperature changes.  Muscle weakness.  Autonomic Neuropathy The symptoms of autonomic neuropathy vary depending on which nerves are affected. Symptoms may include:  Problems with digestion, such as: ? Feeling sick to your stomach (nausea). ? Vomiting. ? Bloating. ? Constipation. ? Diarrhea. ? Abdominal pain.  Difficulty with urination. This occurs if you lose your ability to sense when your bladder is full. Problems include: ? Urine leakage (incontinence). ? Inability to empty your bladder completely (retention).  Rapid or irregular heartbeat (palpitations).  Blood pressure drops when you stand up (orthostatic hypotension). When you stand up you may feel: ? Dizzy. ? Weak. ? Faint.  In men, inability to attain and maintain an erection.  In women, vaginal dryness and problems with decreased sexual desire and arousal.  Problems with body temperature regulation.  Increased or decreased sweating.  Focal Neuropathy  Abnormal eye movements or abnormal alignment of both eyes.  Weakness in the wrist.  Foot drop. This results in an inability to lift the foot properly and abnormal walking or foot movement.  Paralysis on one side of your face (Bell palsy).  Chest or abdominal pain. Radiculoplexus Neuropathy  Sudden, severe pain in your hip, thigh, or buttocks.  Weakness and wasting of thigh muscles.  Difficulty rising from a seated position.  Abdominal swelling.  Unexplained weight loss (usually more than 10 lb [4.5 kg]). How is   this diagnosed? Peripheral Neuropathy Your senses may be tested. Sensory function testing can be  done with:  A light touch using a monofilament.  A vibration with tuning fork.  A sharp sensation with a pin prick.  Other tests that can help diagnose neuropathy are:  Nerve conduction velocity. This test checks the transmission of an electrical current through a nerve.  Electromyography. This shows how muscles respond to electrical signals transmitted by nearby nerves.  Quantitative sensory testing. This is used to assess how your nerves respond to vibrations and changes in temperature.  Autonomic Neuropathy Diagnosis is often based on reported symptoms. Tell your health care provider if you experience:  Dizziness.  Constipation.  Diarrhea.  Inappropriate urination or inability to urinate.  Inability to get or maintain an erection.  Tests that may be done include:  Electrocardiography or Holter monitor. These are tests that can help show problems with the heart rate or heart rhythm.  An X-ray exam may be done.  Focal Neuropathy Diagnosis is made based on your symptoms and what your health care provider finds during your exam. Other tests may be done. They may include:  Nerve conduction velocities. This checks the transmission of electrical current through a nerve.  Electromyography. This shows how muscles respond to electrical signals transmitted by nearby nerves.  Quantitative sensory testing. This test is used to assess how your nerves respond to vibration and changes in temperature.  Radiculoplexus Neuropathy  Often the first thing is to eliminate any other issue or problems that might be the cause, as there is no standard test for diagnosis.  X-ray exam of your spine and lumbar region.  Spinal tap to rule out cancer.  MRI to rule out other lesions. How is this treated? Once nerve damage occurs, it cannot be reversed. The goal of treatment is to keep the disease or nerve damage from getting worse and affecting more nerve fibers. Controlling your blood  glucose level is the key. Most people with radiculoplexus neuropathy see at least a partial improvement over time. You will need to keep your blood glucose and HbA1c levels in the target range determined by your health care provider. Things that help control blood glucose levels include:  Blood glucose monitoring.  Meal planning.  Physical activity.  Diabetes medicine.  Over time, maintaining lower blood glucose levels helps lessen symptoms. Sometimes, prescription pain medicine is needed. Follow these instructions at home:  Do not smoke.  Keep your blood glucose level in the range that you and your health care provider have determined acceptable for you.  Keep your blood pressure level in the range that you and your health care provider have determined acceptable for you.  Eat a well-balanced diet.  Be physically active every day. Include strength training and balance exercises.  Protect your feet. ? Check your feet every day for sores, cuts, blisters, or signs of infection. ? Wear padded socks and supportive shoes. Use orthotic inserts, if necessary. ? Regularly check the insides of your shoes for worn spots. Make sure there are no rocks or other items inside your shoes before you put them on. Contact a health care provider if:  You have burning, stabbing, or aching pain in the legs or feet.  You are unable to feel pressure or pain in your feet.  You develop problems with digestion such as: ? Nausea. ? Vomiting. ? Bloating. ? Constipation. ? Diarrhea. ? Abdominal pain.  You have difficulty with urination, such as: ? Incontinence. ? Retention.    You have palpitations.  You develop orthostatic hypotension. When you stand up you may feel: ? Dizzy. ? Weak. ? Faint.  You cannot attain and maintain an erection (in men).  You have vaginal dryness and problems with decreased sexual desire and arousal (in women).  You have severe pain in your thighs, legs, or  buttocks.  You have unexplained weight loss. This information is not intended to replace advice given to you by your health care provider. Make sure you discuss any questions you have with your health care provider. Document Released: 07/16/2001 Document Revised: 10/13/2015 Document Reviewed: 10/16/2012 Elsevier Interactive Patient Education  2017 [employer]  

## 2018-03-13 NOTE — Progress Notes (Signed)
Subjective:     Patient ID: Michelle Tanner , female    DOB: 02/01/1971 , 47 y.o.   MRN: [id number]   Chief Complaint  Patient presents with  . Diabetes    HPI  Diabetes  She presents for her follow-up diabetic visit. She has type 2 diabetes mellitus. Her disease course has been improving. There are no hypoglycemic associated symptoms. Associated symptoms include foot paresthesias. There are no hypoglycemic complications. Risk factors for coronary artery disease include diabetes mellitus, dyslipidemia, sedentary lifestyle and obesity.     Past Medical History:  Diagnosis Date  . Diabetes mellitus   . Frozen shoulder    left  . Hx of migraine headaches   . Migraines   . Neuromuscular disorder ([place])    diabetic neuropathy      Current Outpatient Medications:  .  Cholecalciferol (VITAMIN D PO), Take by mouth., Disp: , Rfl:  .  dapagliflozin propanediol (FARXIGA) 10 MG TABS tablet, Take 10 mg by mouth daily., Disp: , Rfl:  .  FeFum-FePoly-FA-B Cmp-C-Biot (FOLIVANE-PLUS PO), Take 1 capsule by mouth daily., Disp: , Rfl:  .  folic acid (FOLVITE) 1 MG tablet, Take 1 tablet (1 mg total) by mouth daily., Disp: 30 tablet, Rfl: 11 .  gabapentin (NEURONTIN) 600 MG tablet, Take 600 mg by mouth 2 (two) times daily., Disp: , Rfl:  .  ibuprofen (ADVIL,MOTRIN) 800 MG tablet, Take 800 mg by mouth every 8 (eight) hours as needed. For pain, Disp: , Rfl:  .  magnesium 30 MG tablet, Take 30 mg by mouth 2 (two) times daily., Disp: , Rfl:  .  meloxicam (MOBIC) 7.5 MG tablet, Take 7.5 mg by mouth daily., Disp: , Rfl: 5 .  metFORMIN (GLUCOPHAGE) 1000 MG tablet, TAKE 1 TABLET BY MOUTH TWO TIMES A DAY WITH MORNING AND EVENING MEALS, Disp: 180 tablet, Rfl: 1 .  Multiple Vitamin (MULITIVITAMIN WITH MINERALS) TABS, Take 1 tablet by mouth daily., Disp: , Rfl:  .  nystatin-triamcinolone (MYCOLOG II) cream, Apply topically 4 (four) times daily., Disp: 30 g, Rfl: 0 .  pioglitazone (ACTOS) 15 MG tablet, Take  15 mg by mouth daily., Disp: , Rfl:  .  Semaglutide (OZEMPIC) 0.25 or 0.5 MG/DOSE SOPN, Inject 0.5 mg into the skin once a week. 0.5 mg every Thursday for 6 weeks., Disp: , Rfl:  .  SUMAtriptan (IMITREX) 50 MG tablet, Take 50 mg by mouth every 2 (two) hours as needed., Disp: , Rfl:  .  TRESIBA FLEXTOUCH 200 UNIT/ML SOPN, Inject 30 Units into the skin at bedtime., Disp: , Rfl: 1 .  valACYclovir (VALTREX) 500 MG tablet, Take 500 mg by mouth 3 (three) times daily., Disp: , Rfl: 0   Allergies  Allergen Reactions  . Latex Hives     Review of Systems  Constitutional: Negative.   HENT: Negative.   Respiratory: Negative.   Cardiovascular: Negative.   Musculoskeletal: Positive for back pain.  Neurological: Positive for numbness (SHE C/O TINGLING IN LLE. ALSO W/ BACK PAIN. UNABLE TO STATE WHAT TRIGGERS HER SX. DENIES LLE WEAKNESS).  Hematological: Negative.   Psychiatric/Behavioral: Negative.      Today's Vitals   03/13/18 1537  BP: 118/76  Pulse: 97  Temp: 98.1 F (36.7 C)  TempSrc: Oral  Weight: 197 lb 3.2 oz (89.4 kg)  Height: 5' (1.524 m)   Body mass index is 38.51 kg/m.   Objective:  Physical Exam  Constitutional: She is oriented to person, place, and time. She appears well-developed and  well-nourished.  Eyes: EOM are normal.  Cardiovascular: Normal rate, regular rhythm and normal heart sounds.  Pulmonary/Chest: Effort normal and breath sounds normal.  Neurological: She is alert and oriented to person, place, and time.  Psychiatric: She has a normal mood and affect.  Nursing note and vitals reviewed.       Assessment And Plan:     1. Diabetic mononeuropathy associated with type 2 diabetes mellitus (HCC)  IMPORTANCE OF DIETARY, EXERCISE AND MEDICATION COMPLIANCE WAS DISCUSSED WITH THE PATIENT. SHE IS ENCOURAGED TO AVOID SUGARY BEVERAGES AND PROCESSED FOODS.    - CMP14+EGFR - Hemoglobin A1c - Lipid Profile  2. Essential (hemorrhagic) thrombocythemia (HCC)  CHRONIC,  SHE IS ALSO FOLLOWED BY HEMATOLOGY.   3. Numbness and tingling of left lower extremity  I WILL REFER HER TO NEUROLOGY FOR NERVE CONDUCTION STUDY.  - Ambulatory referral to Neurology  4. Chronic bilateral low back pain without sciatica  CHRONIC. HER SX ARE LIKELY EXACERBATED BY INCREASED ABDOMINAL ADIPOSITY. SHE IS ENCOURAGED TO PERFORM STRETCHING EXERCISES. ALSO IMPORTANT FOR HER TO WEAR ORTHOTICS SINCE SHE STANDS A LOT AT WORK. SHE IS ENCOURAGED TO IMPLEMENT THESE THINGS AND LET ME KNOW IF HER SX PERSIST.   - Ambulatory referral to Neurology  [name], MD

## 2018-03-14 LAB — CMP14+EGFR
A/G RATIO: 1.3 (ref 1.2–2.2)
ALBUMIN: 4.3 g/dL (ref 3.5–5.5)
ALT: 24 IU/L (ref 0–32)
AST: 21 IU/L (ref 0–40)
Alkaline Phosphatase: 133 IU/L — ABNORMAL HIGH (ref 39–117)
BUN / CREAT RATIO: 12 (ref 9–23)
BUN: 6 mg/dL (ref 6–24)
CHLORIDE: 97 mmol/L (ref 96–106)
CO2: 24 mmol/L (ref 20–29)
Calcium: 10 mg/dL (ref 8.7–10.2)
Creatinine, Ser: 0.51 mg/dL — ABNORMAL LOW (ref 0.57–1.00)
GFR calc non Af Amer: 115 mL/min/{1.73_m2} (ref >59)
GFR, EST AFRICAN AMERICAN: 132 mL/min/{1.73_m2} (ref >59)
GLOBULIN, TOTAL: 3.3 g/dL (ref 1.5–4.5)
GLUCOSE: 214 mg/dL — AB (ref 65–99)
Potassium: 4.3 mmol/L (ref 3.5–5.2)
SODIUM: 137 mmol/L (ref 134–144)
TOTAL PROTEIN: 7.6 g/dL (ref 6.0–8.5)

## 2018-03-14 LAB — LIPID PANEL
CHOL/HDL RATIO: 2.8 ratio (ref 0.0–4.4)
CHOLESTEROL TOTAL: 128 mg/dL (ref 100–199)
HDL: 46 mg/dL (ref >39)
LDL CALC: 29 mg/dL (ref 0–99)
Triglycerides: 263 mg/dL — ABNORMAL HIGH (ref 0–149)
VLDL CHOLESTEROL CAL: 53 mg/dL — AB (ref 5–40)

## 2018-03-14 LAB — HEMOGLOBIN A1C
Est. average glucose Bld gHb Est-mCnc: 166 mg/dL
Hgb A1c MFr Bld: 7.4 % — ABNORMAL HIGH (ref 4.8–5.6)

## 2018-03-17 NOTE — Progress Notes (Signed)
Here are your lab results:  Your liver and kidney function are normal. Your hba1c is 7.4, his is great! I am so proud of you! Keep up the great work! Your triglycerides are elevated, please increase exercise.   Sincerely,    Londin Antone N. [facility]aird [facility], MD

## 2018-03-31 ENCOUNTER — Inpatient Hospital Stay: Payer: 59 | Attending: Family

## 2018-03-31 ENCOUNTER — Inpatient Hospital Stay (HOSPITAL_BASED_OUTPATIENT_CLINIC_OR_DEPARTMENT_OTHER): Payer: 59 | Admitting: Family

## 2018-03-31 ENCOUNTER — Telehealth: Payer: Self-pay | Admitting: Family

## 2018-03-31 ENCOUNTER — Other Ambulatory Visit: Payer: Self-pay

## 2018-03-31 ENCOUNTER — Encounter: Payer: Self-pay | Admitting: Family

## 2018-03-31 VITALS — BP 139/77 | HR 82 | Temp 98.8°F | Resp 18 | Wt 203.0 lb

## 2018-03-31 DIAGNOSIS — D509 Iron deficiency anemia, unspecified: Secondary | ICD-10-CM | POA: Insufficient documentation

## 2018-03-31 DIAGNOSIS — G629 Polyneuropathy, unspecified: Secondary | ICD-10-CM | POA: Diagnosis not present

## 2018-03-31 DIAGNOSIS — D500 Iron deficiency anemia secondary to blood loss (chronic): Secondary | ICD-10-CM

## 2018-03-31 DIAGNOSIS — Z79899 Other long term (current) drug therapy: Secondary | ICD-10-CM | POA: Insufficient documentation

## 2018-03-31 DIAGNOSIS — D560 Alpha thalassemia: Secondary | ICD-10-CM

## 2018-03-31 LAB — CBC WITH DIFFERENTIAL (CANCER CENTER ONLY)
ABS IMMATURE GRANULOCYTES: 0.04 10*3/uL (ref 0.00–0.07)
BASOS PCT: 1 %
Basophils Absolute: 0.1 10*3/uL (ref 0.0–0.1)
EOS PCT: 4 %
Eosinophils Absolute: 0.5 10*3/uL (ref 0.0–0.5)
HCT: 41.7 % (ref 36.0–46.0)
HEMOGLOBIN: 12.6 g/dL (ref 12.0–15.0)
Immature Granulocytes: 0 %
Lymphocytes Relative: 21 %
Lymphs Abs: 2.6 10*3/uL (ref 0.7–4.0)
MCH: 25.9 pg — ABNORMAL LOW (ref 26.0–34.0)
MCHC: 30.2 g/dL (ref 30.0–36.0)
MCV: 85.8 fL (ref 80.0–100.0)
Monocytes Absolute: 0.7 10*3/uL (ref 0.1–1.0)
Monocytes Relative: 5 %
NEUTROS ABS: 8.5 10*3/uL — AB (ref 1.7–7.7)
NEUTROS PCT: 69 %
Platelet Count: 401 10*3/uL — ABNORMAL HIGH (ref 150–400)
RBC: 4.86 MIL/uL (ref 3.87–5.11)
RDW: 15.2 % (ref 11.5–15.5)
WBC Count: 12.2 10*3/uL — ABNORMAL HIGH (ref 4.0–10.5)
nRBC: 0 % (ref 0.0–0.2)

## 2018-03-31 LAB — CMP (CANCER CENTER ONLY)
ALK PHOS: 106 U/L — AB (ref 26–84)
ALT: 22 U/L (ref 10–47)
ANION GAP: 5 (ref 5–15)
AST: 26 U/L (ref 11–38)
Albumin: 3.3 g/dL — ABNORMAL LOW (ref 3.5–5.0)
BUN: 8 mg/dL (ref 7–22)
CHLORIDE: 103 mmol/L (ref 98–108)
CO2: 30 mmol/L (ref 18–33)
Calcium: 9 mg/dL (ref 8.0–10.3)
Creatinine: 0.6 mg/dL (ref 0.60–1.20)
Glucose, Bld: 115 mg/dL (ref 73–118)
Potassium: 3.9 mmol/L (ref 3.3–4.7)
SODIUM: 138 mmol/L (ref 128–145)
TOTAL PROTEIN: 7.4 g/dL (ref 6.4–8.1)
Total Bilirubin: 0.5 mg/dL (ref 0.2–1.6)

## 2018-03-31 MED FILL — MELOXICAM 7.5 MG TABLET: 7.5 | 30 days supply | Qty: 30 | Fill #3

## 2018-03-31 MED FILL — GABAPENTIN 600 MG TABLET: 600 | 30 days supply | Qty: 60 | Fill #5

## 2018-03-31 MED FILL — FUSION PLUS CAPSULE: 30 days supply | Qty: 30 | Fill #1

## 2018-03-31 NOTE — Progress Notes (Signed)
Hematology and Oncology Follow Up Visit  KATHARYN SCHAUER [id number] 31-Oct-1970 47 y.o. 03/31/2018   Principle Diagnosis:  Iron deficiency anemia Alpha thalassemia  Current Therapy:   IV iron as indicated - last received in May 4196 Folic acid 1 mg PO daily   Interim History:  Ms. Aguilera is here today for follow-up. She is symptomatic with fatigue and states that she has been having pain in her left hip and leg with swelling in the foot that comes and goes. This improves when she props up her feet. She has been referred to neurology and is waiting to hear from their office with an appointment date and time for further work up.  She has history of headaches and migraines. She takes Imitrex as needed.  She has had no falls or syncopal episodes.  She has episodes of chills.  No fever, n/v, cough, rash, dizziness, SOB, chest pain, palpitations, abdominal pain or changes in bowel or bladder habits. She still goes back and forth between constipation and diarrhea.  She states that the neuropathy in her hands and feet is stable.  Her appetite comes and goes. She is staying well hydrated. Her weight is stable.   ECOG Performance Status: 1 - Symptomatic but completely ambulatory  Medications:  Allergies as of 03/31/2018      Reactions   Latex Hives      Medication List        Accurate as of 03/31/18  2:28 PM. Always use your most recent med list.          FARXIGA 10 MG Tabs tablet Generic drug:  dapagliflozin propanediol Take 10 mg by mouth daily.   folic acid 1 MG tablet Commonly known as:  FOLVITE Take 1 tablet (1 mg total) by mouth daily.   FOLIVANE-PLUS PO Take 1 capsule by mouth daily.   gabapentin 600 MG tablet Commonly known as:  NEURONTIN Take 600 mg by mouth 2 (two) times daily.   ibuprofen 800 MG tablet Commonly known as:  ADVIL,MOTRIN Take 800 mg by mouth every 8 (eight) hours as needed. For pain   magnesium 30 MG tablet Take 30 mg by mouth 2 (two)  times daily.   meloxicam 7.5 MG tablet Commonly known as:  MOBIC Take 7.5 mg by mouth daily.   metFORMIN 1000 MG tablet Commonly known as:  GLUCOPHAGE TAKE 1 TABLET BY MOUTH TWO TIMES A DAY WITH MORNING AND EVENING MEALS   multivitamin with minerals Tabs tablet Take 1 tablet by mouth daily.   nystatin-triamcinolone cream Commonly known as:  MYCOLOG II Apply topically 4 (four) times daily.   OZEMPIC (0.25 OR 0.5 MG/DOSE) 2 MG/1.5ML Sopn Generic drug:  Semaglutide(0.25 or 0.5MG /DOS) Inject 0.5 mg into the skin once a week. 0.5 mg every Thursday for 6 weeks.   pioglitazone 15 MG tablet Commonly known as:  ACTOS Take 15 mg by mouth daily.   SUMAtriptan 50 MG tablet Commonly known as:  IMITREX Take 50 mg by mouth every 2 (two) hours as needed.   TRESIBA FLEXTOUCH 200 UNIT/ML Sopn Generic drug:  Insulin Degludec Inject 30 Units into the skin at bedtime.   valACYclovir 500 MG tablet Commonly known as:  VALTREX Take 500 mg by mouth 3 (three) times daily.   VITAMIN D PO Take by mouth.       Allergies:  Allergies  Allergen Reactions  . Latex Hives    Past Medical History, Surgical history, Social history, and Family History were reviewed and updated.  Review of Systems: All other 10 point review of systems is negative.   Physical Exam:  vitals were not taken for this visit.   Wt Readings from Last 3 Encounters:  03/13/18 197 lb 3.2 oz (89.4 kg)  12/03/17 199 lb (90.3 kg)  12/26/17 198 lb (89.8 kg)    Ocular: Sclerae unicteric, pupils equal, round and reactive to light Ear-nose-throat: Oropharynx clear, dentition fair Lymphatic: No cervical, supraclavicular or axillary adenopathy Lungs no rales or rhonchi, good excursion bilaterally Heart regular rate and rhythm, no murmur appreciated Abd soft, nontender, positive bowel sounds, no liver or spleen tip palpated on exam, no fluid wave  MSK no focal spinal tenderness, no joint edema Neuro: non-focal,  well-oriented, appropriate affect Breasts: Deferred   Lab Results  Component Value Date   WBC 15.4 (H) 12/26/2017   HGB 12.3 12/26/2017   HCT 39.6 12/26/2017   MCV 79.4 (L) 12/26/2017   PLT 361 12/26/2017   Lab Results  Component Value Date   FERRITIN 52 12/26/2017   IRON 23 (L) 12/26/2017   TIBC 311 12/26/2017   UIBC 288 12/26/2017   IRONPCTSAT 8 (L) 12/26/2017   Lab Results  Component Value Date   RETICCTPCT 1.1 09/26/2017   RBC 4.99 12/26/2017   No results found for: KPAFRELGTCHN, LAMBDASER, KAPLAMBRATIO No results found for: IGGSERUM, IGA, IGMSERUM No results found for: Odetta Pink, SPEI   Chemistry      Component Value Date/Time   NA 137 03/13/2018 1618   K 4.3 03/13/2018 1618   CL 97 03/13/2018 1618   CO2 24 03/13/2018 1618   BUN 6 03/13/2018 1618   CREATININE 0.51 (L) 03/13/2018 1618   CREATININE 0.60 12/26/2017 1057   GLU 78 08/29/2017      Component Value Date/Time   CALCIUM 10.0 03/13/2018 1618   ALKPHOS 133 (H) 03/13/2018 1618   AST 21 03/13/2018 1618   AST 24 12/26/2017 1057   ALT 24 03/13/2018 1618   ALT 24 12/26/2017 1057   BILITOT <0.2 03/13/2018 1618   BILITOT 0.4 12/26/2017 1057       Impression and Plan: Ms. Nickell is a very pleasant 47 yo African American female with iron deficiency anemia. She is having issues now with neuropathy pain in the left leg with some intermittent foot swelling and has been referred to neuro.  We will see what her iron studies show and bring her back in for infusion if needed.  We will plan to see her back in another 4 months for follow-up.  She will contact our office with any questions or concerns. We can certainly see her sooner if need be.     Laverna Peace, NP 11/11/20192:28 PM

## 2018-03-31 NOTE — Telephone Encounter (Signed)
Appts scheduled letter/calendar mailed per 11/11 los °

## 2018-04-01 ENCOUNTER — Telehealth: Payer: Self-pay | Admitting: Family

## 2018-04-01 LAB — IRON AND TIBC
Iron: 50 ug/dL (ref 41–142)
Saturation Ratios: 20 % — ABNORMAL LOW (ref 21–57)
TIBC: 254 ug/dL (ref 236–444)
UIBC: 204 ug/dL (ref 120–384)

## 2018-04-01 LAB — FERRITIN: Ferritin: 300 ng/mL (ref 11–307)

## 2018-04-01 NOTE — Telephone Encounter (Signed)
Appointments scheduled letter/calendar mailed per 11/11 los

## 2018-04-02 ENCOUNTER — Telehealth: Payer: Self-pay | Admitting: Hematology & Oncology

## 2018-04-02 MED FILL — FOLIC ACID 1 MG TABS: 1 | 30 days supply | Qty: 30 | Fill #2

## 2018-04-02 NOTE — Telephone Encounter (Signed)
lmom for pt to return call to office to schedule iv iron appts per sch msg

## 2018-04-11 ENCOUNTER — Telehealth: Payer: Self-pay

## 2018-04-11 NOTE — Telephone Encounter (Signed)
Left a message for the pt to call back so that I can see if she has had her diabetic eye exam for the year.

## 2018-04-15 MED FILL — OZEMPIC 0.25 OR 0.5 MG/DOSE: 2 | 28 days supply | Qty: 2 | Fill #2

## 2018-04-24 MED FILL — FREESTYLE LIBRE 14 DAY SENS: 28 days supply | Qty: 2 | Fill #3

## 2018-04-29 ENCOUNTER — Other Ambulatory Visit: Payer: Self-pay | Admitting: Internal Medicine

## 2018-04-29 MED FILL — FUSION PLUS CAPSULE: 30 days supply | Qty: 30 | Fill #0

## 2018-04-29 MED FILL — MELOXICAM 7.5 MG TABLET: 7.5 | 30 days supply | Qty: 30 | Fill #4

## 2018-04-29 MED FILL — GABAPENTIN 600 MG TABLET: 600 | 30 days supply | Qty: 60 | Fill #0

## 2018-04-29 MED FILL — PIOGLITAZONE HCL 15 MG TAB: 15 | 30 days supply | Qty: 30 | Fill #4

## 2018-04-29 MED FILL — FOLIC ACID 1 MG TABS: 1 | 30 days supply | Qty: 30 | Fill #3

## 2018-05-08 ENCOUNTER — Encounter: Payer: 59 | Admitting: Diagnostic Neuroimaging

## 2018-05-08 ENCOUNTER — Ambulatory Visit (INDEPENDENT_AMBULATORY_CARE_PROVIDER_SITE_OTHER): Payer: 59 | Admitting: Diagnostic Neuroimaging

## 2018-05-08 DIAGNOSIS — Z0289 Encounter for other administrative examinations: Secondary | ICD-10-CM

## 2018-05-08 DIAGNOSIS — R208 Other disturbances of skin sensation: Secondary | ICD-10-CM

## 2018-05-08 DIAGNOSIS — R200 Anesthesia of skin: Secondary | ICD-10-CM

## 2018-05-09 NOTE — Procedures (Signed)
[facility]  NCS (NERVE CONDUCTION STUDY) WITH EMG (ELECTROMYOGRAPHY) REPORT   STUDY DATE: 05/08/18 PATIENT NAME: Michelle Tanner DOB: 08/19/1970 MRN: [id number]  ORDERING CLINICIAN: Glendale Chard, MD  TECHNOLOGIST: Belinda Block ELECTROMYOGRAPHER: Earlean Polka. , MD  CLINICAL INFORMATION: 47 year old female with left foot numbness and low back pain.  FINDINGS: NERVE CONDUCTION STUDY:  Left peroneal and left tibial motor responses are normal.  Bilateral sural and right superficial peroneal sensory responses are normal.  Left superficial peroneal sensory response could not be obtained.  Left tibial F wave latency is normal.   NEEDLE ELECTROMYOGRAPHY:  Needle examination of left lower extremity and left lumbar paraspinal muscles notable for rare abnormal spontaneous activity in the left vastus medialis and 1+ abnormal spontaneous activity in the left tibialis anterior muscles at rest.    Normal motor unit recruitment on exertion.    IMPRESSION:   This study demonstrates: - Left superficial peroneal sensory response could not be obtained.  This could be related to underlying focal sensory neuropathy versus technical artifact. - Abnormal needle EMG of left vastus medialis and left tibialis anterior muscles, raises possibility of underlying lumbar polyradiculopathy.  Consider clinical correlation and neuroimaging study (MRI) of the lumbar spine. - No evidence of widespread diffuse polyneuropathy.     INTERPRETING PHYSICIAN:  Penni Bombard, MD Certified in Neurology, Neurophysiology and Neuroimaging  [facility] [address], [place], [place] [address] [phone]   [facility]    Nerve / Sites Muscle Latency Ref. Amplitude Ref. Rel Amp Segments Distance Velocity Ref. Area    ms ms mV mV %  cm m/s m/s mVms  L Peroneal - EDB     Ankle EDB 4.1 ?6.5 7.0 ?2.0 100 Ankle - EDB 9   17.9     Fib head EDB 9.8  6.2   88.3 Fib head - Ankle 26 45 ?44 17.2     Pop fossa EDB 12.0  5.8  94.3 Pop fossa - Fib head 10 45 ?44 19.8         Pop fossa - Ankle      L Tibial - AH     Ankle AH 5.1 ?5.8 13.6 ?4.0 100 Ankle - AH 9   27.9     Pop fossa AH 12.6  10.6  77.5 Pop fossa - Ankle 31 42 ?41 27.8         SNC    Nerve / Sites Rec. Site Peak Lat Ref.  Amp Ref. Segments Distance    ms ms V V  cm  L Sural - Ankle (Calf)     Calf Ankle 4.3 ?4.4 4 ?6 Calf - Ankle 14  R Sural - Ankle (Calf)     Calf Ankle 3.9 ?4.4 3 ?6 Calf - Ankle 14  L Superficial peroneal - Ankle     Lat leg Ankle NR ?4.4 NR ?6 Lat leg - Ankle 14  R Superficial peroneal - Ankle     Lat leg Ankle 4.0 ?4.4 8 ?6 Lat leg - Ankle 14             F  Wave    Nerve F Lat Ref.   ms ms  L Tibial - AH 51.9 ?56.0       EMG full       EMG Summary Table    Spontaneous MUAP Recruitment  Muscle IA Fib PSW Fasc Other Amp Dur. Poly Pattern  L. Vastus medialis Normal rare  rare None _______ Normal Normal Normal Normal  L. Tibialis anterior Increased 1+ 1+ None _______ Normal Normal Normal Normal  L. Gastrocnemius (Medial head) Normal None None None _______ Normal Normal Normal Normal  L. Lumbar paraspinals Normal None None None _______ Normal Normal Normal Normal

## 2018-05-12 DIAGNOSIS — M9905 Segmental and somatic dysfunction of pelvic region: Secondary | ICD-10-CM | POA: Diagnosis not present

## 2018-05-12 DIAGNOSIS — M9902 Segmental and somatic dysfunction of thoracic region: Secondary | ICD-10-CM | POA: Diagnosis not present

## 2018-05-12 DIAGNOSIS — M5417 Radiculopathy, lumbosacral region: Secondary | ICD-10-CM | POA: Diagnosis not present

## 2018-05-12 DIAGNOSIS — M531 Cervicobrachial syndrome: Secondary | ICD-10-CM | POA: Diagnosis not present

## 2018-05-12 DIAGNOSIS — M9901 Segmental and somatic dysfunction of cervical region: Secondary | ICD-10-CM | POA: Diagnosis not present

## 2018-05-12 DIAGNOSIS — M9903 Segmental and somatic dysfunction of lumbar region: Secondary | ICD-10-CM | POA: Diagnosis not present

## 2018-05-12 DIAGNOSIS — M62830 Muscle spasm of back: Secondary | ICD-10-CM | POA: Diagnosis not present

## 2018-05-15 DIAGNOSIS — M9901 Segmental and somatic dysfunction of cervical region: Secondary | ICD-10-CM | POA: Diagnosis not present

## 2018-05-15 DIAGNOSIS — M5417 Radiculopathy, lumbosacral region: Secondary | ICD-10-CM | POA: Diagnosis not present

## 2018-05-15 DIAGNOSIS — M531 Cervicobrachial syndrome: Secondary | ICD-10-CM | POA: Diagnosis not present

## 2018-05-15 DIAGNOSIS — M9902 Segmental and somatic dysfunction of thoracic region: Secondary | ICD-10-CM | POA: Diagnosis not present

## 2018-05-15 DIAGNOSIS — M9905 Segmental and somatic dysfunction of pelvic region: Secondary | ICD-10-CM | POA: Diagnosis not present

## 2018-05-15 DIAGNOSIS — M9903 Segmental and somatic dysfunction of lumbar region: Secondary | ICD-10-CM | POA: Diagnosis not present

## 2018-05-15 DIAGNOSIS — M62830 Muscle spasm of back: Secondary | ICD-10-CM | POA: Diagnosis not present

## 2018-05-18 NOTE — Progress Notes (Signed)
Nerve conduction study reveals that there is likely a lumbar (back) source of her symptoms. I would like to schedule you for a lumbar spine MRI for further evaluation of lumbar radiculopathy. Please let me know if you are willing for me to schedule this procedure.   Happy new year!   Sincerely,    Ritchard Paragas N. Baird Cancer, MD

## 2018-05-19 DIAGNOSIS — M9905 Segmental and somatic dysfunction of pelvic region: Secondary | ICD-10-CM | POA: Diagnosis not present

## 2018-05-19 DIAGNOSIS — M5417 Radiculopathy, lumbosacral region: Secondary | ICD-10-CM | POA: Diagnosis not present

## 2018-05-19 DIAGNOSIS — M62830 Muscle spasm of back: Secondary | ICD-10-CM | POA: Diagnosis not present

## 2018-05-19 DIAGNOSIS — M531 Cervicobrachial syndrome: Secondary | ICD-10-CM | POA: Diagnosis not present

## 2018-05-19 DIAGNOSIS — M9902 Segmental and somatic dysfunction of thoracic region: Secondary | ICD-10-CM | POA: Diagnosis not present

## 2018-05-19 DIAGNOSIS — M9901 Segmental and somatic dysfunction of cervical region: Secondary | ICD-10-CM | POA: Diagnosis not present

## 2018-05-19 DIAGNOSIS — M9903 Segmental and somatic dysfunction of lumbar region: Secondary | ICD-10-CM | POA: Diagnosis not present

## 2018-05-20 DIAGNOSIS — M9903 Segmental and somatic dysfunction of lumbar region: Secondary | ICD-10-CM | POA: Diagnosis not present

## 2018-05-20 DIAGNOSIS — M5417 Radiculopathy, lumbosacral region: Secondary | ICD-10-CM | POA: Diagnosis not present

## 2018-05-20 DIAGNOSIS — M531 Cervicobrachial syndrome: Secondary | ICD-10-CM | POA: Diagnosis not present

## 2018-05-20 DIAGNOSIS — M9905 Segmental and somatic dysfunction of pelvic region: Secondary | ICD-10-CM | POA: Diagnosis not present

## 2018-05-20 DIAGNOSIS — M62830 Muscle spasm of back: Secondary | ICD-10-CM | POA: Diagnosis not present

## 2018-05-20 DIAGNOSIS — M9902 Segmental and somatic dysfunction of thoracic region: Secondary | ICD-10-CM | POA: Diagnosis not present

## 2018-05-20 DIAGNOSIS — M9901 Segmental and somatic dysfunction of cervical region: Secondary | ICD-10-CM | POA: Diagnosis not present

## 2018-05-22 DIAGNOSIS — M9901 Segmental and somatic dysfunction of cervical region: Secondary | ICD-10-CM | POA: Diagnosis not present

## 2018-05-22 DIAGNOSIS — M5417 Radiculopathy, lumbosacral region: Secondary | ICD-10-CM | POA: Diagnosis not present

## 2018-05-22 DIAGNOSIS — M9905 Segmental and somatic dysfunction of pelvic region: Secondary | ICD-10-CM | POA: Diagnosis not present

## 2018-05-22 DIAGNOSIS — M62830 Muscle spasm of back: Secondary | ICD-10-CM | POA: Diagnosis not present

## 2018-05-22 DIAGNOSIS — M9903 Segmental and somatic dysfunction of lumbar region: Secondary | ICD-10-CM | POA: Diagnosis not present

## 2018-05-22 DIAGNOSIS — M531 Cervicobrachial syndrome: Secondary | ICD-10-CM | POA: Diagnosis not present

## 2018-05-22 DIAGNOSIS — M9902 Segmental and somatic dysfunction of thoracic region: Secondary | ICD-10-CM | POA: Diagnosis not present

## 2018-05-22 MED FILL — OZEMPIC 0.25 OR 0.5 MG/DOSE: 2 | 28 days supply | Qty: 2 | Fill #3

## 2018-05-23 ENCOUNTER — Telehealth: Payer: Self-pay

## 2018-05-23 NOTE — Telephone Encounter (Signed)
-----   Message from Glendale Chard, MD sent at 05/18/2018  4:24 PM EST ----- Nerve conduction study reveals that there is likely a lumbar (back) source of her symptoms. I would like to schedule you for a lumbar spine MRI for further evaluation of lumbar radiculopathy. Please let me know if you are willing for me to schedule this procedure.   Happy new year!   Sincerely,    Robyn N. Baird Cancer, MD

## 2018-05-23 NOTE — Telephone Encounter (Signed)
Left the pt a message to call back for results of nerve conduction study.

## 2018-05-26 DIAGNOSIS — M9901 Segmental and somatic dysfunction of cervical region: Secondary | ICD-10-CM | POA: Diagnosis not present

## 2018-05-26 DIAGNOSIS — M531 Cervicobrachial syndrome: Secondary | ICD-10-CM | POA: Diagnosis not present

## 2018-05-26 DIAGNOSIS — M5417 Radiculopathy, lumbosacral region: Secondary | ICD-10-CM | POA: Diagnosis not present

## 2018-05-26 DIAGNOSIS — M9903 Segmental and somatic dysfunction of lumbar region: Secondary | ICD-10-CM | POA: Diagnosis not present

## 2018-05-26 DIAGNOSIS — M9905 Segmental and somatic dysfunction of pelvic region: Secondary | ICD-10-CM | POA: Diagnosis not present

## 2018-05-26 DIAGNOSIS — H524 Presbyopia: Secondary | ICD-10-CM | POA: Diagnosis not present

## 2018-05-26 DIAGNOSIS — H5213 Myopia, bilateral: Secondary | ICD-10-CM | POA: Diagnosis not present

## 2018-05-26 DIAGNOSIS — M62830 Muscle spasm of back: Secondary | ICD-10-CM | POA: Diagnosis not present

## 2018-05-26 DIAGNOSIS — M9902 Segmental and somatic dysfunction of thoracic region: Secondary | ICD-10-CM | POA: Diagnosis not present

## 2018-05-28 DIAGNOSIS — M531 Cervicobrachial syndrome: Secondary | ICD-10-CM | POA: Diagnosis not present

## 2018-05-28 DIAGNOSIS — M62830 Muscle spasm of back: Secondary | ICD-10-CM | POA: Diagnosis not present

## 2018-05-28 DIAGNOSIS — M9901 Segmental and somatic dysfunction of cervical region: Secondary | ICD-10-CM | POA: Diagnosis not present

## 2018-05-28 DIAGNOSIS — M9905 Segmental and somatic dysfunction of pelvic region: Secondary | ICD-10-CM | POA: Diagnosis not present

## 2018-05-28 DIAGNOSIS — M9902 Segmental and somatic dysfunction of thoracic region: Secondary | ICD-10-CM | POA: Diagnosis not present

## 2018-05-28 DIAGNOSIS — M9903 Segmental and somatic dysfunction of lumbar region: Secondary | ICD-10-CM | POA: Diagnosis not present

## 2018-05-28 DIAGNOSIS — M5417 Radiculopathy, lumbosacral region: Secondary | ICD-10-CM | POA: Diagnosis not present

## 2018-05-29 DIAGNOSIS — M62830 Muscle spasm of back: Secondary | ICD-10-CM | POA: Diagnosis not present

## 2018-05-29 DIAGNOSIS — M531 Cervicobrachial syndrome: Secondary | ICD-10-CM | POA: Diagnosis not present

## 2018-05-29 DIAGNOSIS — M9903 Segmental and somatic dysfunction of lumbar region: Secondary | ICD-10-CM | POA: Diagnosis not present

## 2018-05-29 DIAGNOSIS — M9905 Segmental and somatic dysfunction of pelvic region: Secondary | ICD-10-CM | POA: Diagnosis not present

## 2018-05-29 DIAGNOSIS — M5417 Radiculopathy, lumbosacral region: Secondary | ICD-10-CM | POA: Diagnosis not present

## 2018-05-29 DIAGNOSIS — M9902 Segmental and somatic dysfunction of thoracic region: Secondary | ICD-10-CM | POA: Diagnosis not present

## 2018-05-29 DIAGNOSIS — M9901 Segmental and somatic dysfunction of cervical region: Secondary | ICD-10-CM | POA: Diagnosis not present

## 2018-05-30 ENCOUNTER — Telehealth: Payer: Self-pay

## 2018-05-30 ENCOUNTER — Other Ambulatory Visit: Payer: Self-pay | Admitting: Internal Medicine

## 2018-05-30 MED FILL — FUSION PLUS CAPSULE: 30 days supply | Qty: 30 | Fill #1

## 2018-05-30 MED FILL — FOLIC ACID 1 MG TABS: 1 | 30 days supply | Qty: 30 | Fill #4

## 2018-05-30 MED FILL — metFORMIN HCL 1000 MG TABS: 1000 | 90 days supply | Qty: 180 | Fill #1

## 2018-05-30 MED FILL — MELOXICAM 7.5 MG TABLET: 7.5 | 30 days supply | Qty: 30 | Fill #5

## 2018-05-30 MED FILL — PIOGLITAZONE HCL 15 MG TAB: 15 | 30 days supply | Qty: 30 | Fill #5

## 2018-05-30 MED FILL — TRESIBA FLEXTOUCH 200 UNITS: 200 | 23 days supply | Qty: 9 | Fill #0

## 2018-05-30 MED FILL — FREESTYLE LIBRE 14 DAY SENS: 28 days supply | Qty: 2 | Fill #4

## 2018-05-30 MED FILL — GABAPENTIN 600 MG TABLET: 600 | 30 days supply | Qty: 60 | Fill #1

## 2018-05-30 NOTE — Telephone Encounter (Signed)
Returned the pt's call on the call back number she left [phone].  I was unable to leave a message because the pt's voicemail isn't set up.

## 2018-05-30 NOTE — Telephone Encounter (Signed)
The pt called back and she was given her nerve conduction study results.

## 2018-06-02 ENCOUNTER — Telehealth: Payer: Self-pay | Admitting: Family

## 2018-06-02 DIAGNOSIS — M9903 Segmental and somatic dysfunction of lumbar region: Secondary | ICD-10-CM | POA: Diagnosis not present

## 2018-06-02 DIAGNOSIS — M9901 Segmental and somatic dysfunction of cervical region: Secondary | ICD-10-CM | POA: Diagnosis not present

## 2018-06-02 DIAGNOSIS — M531 Cervicobrachial syndrome: Secondary | ICD-10-CM | POA: Diagnosis not present

## 2018-06-02 DIAGNOSIS — M9905 Segmental and somatic dysfunction of pelvic region: Secondary | ICD-10-CM | POA: Diagnosis not present

## 2018-06-02 DIAGNOSIS — M62830 Muscle spasm of back: Secondary | ICD-10-CM | POA: Diagnosis not present

## 2018-06-02 DIAGNOSIS — M5417 Radiculopathy, lumbosacral region: Secondary | ICD-10-CM | POA: Diagnosis not present

## 2018-06-02 DIAGNOSIS — M9902 Segmental and somatic dysfunction of thoracic region: Secondary | ICD-10-CM | POA: Diagnosis not present

## 2018-06-02 NOTE — Telephone Encounter (Signed)
Appointments adjusted letter/calendar mailed  °

## 2018-06-04 DIAGNOSIS — M9902 Segmental and somatic dysfunction of thoracic region: Secondary | ICD-10-CM | POA: Diagnosis not present

## 2018-06-04 DIAGNOSIS — M62830 Muscle spasm of back: Secondary | ICD-10-CM | POA: Diagnosis not present

## 2018-06-04 DIAGNOSIS — M5417 Radiculopathy, lumbosacral region: Secondary | ICD-10-CM | POA: Diagnosis not present

## 2018-06-04 DIAGNOSIS — M9905 Segmental and somatic dysfunction of pelvic region: Secondary | ICD-10-CM | POA: Diagnosis not present

## 2018-06-04 DIAGNOSIS — M531 Cervicobrachial syndrome: Secondary | ICD-10-CM | POA: Diagnosis not present

## 2018-06-04 DIAGNOSIS — M9901 Segmental and somatic dysfunction of cervical region: Secondary | ICD-10-CM | POA: Diagnosis not present

## 2018-06-04 DIAGNOSIS — M9903 Segmental and somatic dysfunction of lumbar region: Secondary | ICD-10-CM | POA: Diagnosis not present

## 2018-06-05 ENCOUNTER — Other Ambulatory Visit: Payer: Self-pay | Admitting: Family

## 2018-06-05 ENCOUNTER — Inpatient Hospital Stay: Payer: 59 | Attending: Family

## 2018-06-05 DIAGNOSIS — M531 Cervicobrachial syndrome: Secondary | ICD-10-CM | POA: Diagnosis not present

## 2018-06-05 DIAGNOSIS — D500 Iron deficiency anemia secondary to blood loss (chronic): Secondary | ICD-10-CM

## 2018-06-05 DIAGNOSIS — D560 Alpha thalassemia: Secondary | ICD-10-CM

## 2018-06-05 DIAGNOSIS — M62830 Muscle spasm of back: Secondary | ICD-10-CM | POA: Diagnosis not present

## 2018-06-05 DIAGNOSIS — D509 Iron deficiency anemia, unspecified: Secondary | ICD-10-CM | POA: Insufficient documentation

## 2018-06-05 DIAGNOSIS — M9905 Segmental and somatic dysfunction of pelvic region: Secondary | ICD-10-CM | POA: Diagnosis not present

## 2018-06-05 DIAGNOSIS — M9901 Segmental and somatic dysfunction of cervical region: Secondary | ICD-10-CM | POA: Diagnosis not present

## 2018-06-05 DIAGNOSIS — M9902 Segmental and somatic dysfunction of thoracic region: Secondary | ICD-10-CM | POA: Diagnosis not present

## 2018-06-05 DIAGNOSIS — M9903 Segmental and somatic dysfunction of lumbar region: Secondary | ICD-10-CM | POA: Diagnosis not present

## 2018-06-05 DIAGNOSIS — M5417 Radiculopathy, lumbosacral region: Secondary | ICD-10-CM | POA: Diagnosis not present

## 2018-06-05 LAB — CMP (CANCER CENTER ONLY)
ALBUMIN: 3.9 g/dL (ref 3.5–5.0)
ALT: 18 U/L (ref 0–44)
ANION GAP: 6 (ref 5–15)
AST: 22 U/L (ref 15–41)
Alkaline Phosphatase: 113 U/L (ref 38–126)
BUN: 8 mg/dL (ref 6–20)
CO2: 28 mmol/L (ref 22–32)
Calcium: 9.1 mg/dL (ref 8.9–10.3)
Chloride: 98 mmol/L (ref 98–111)
Creatinine: 0.64 mg/dL (ref 0.44–1.00)
GFR, Est AFR Am: >60 mL/min (ref >60)
GFR, Estimated: >60 mL/min (ref >60)
GLUCOSE: 177 mg/dL — AB (ref 70–99)
POTASSIUM: 4.2 mmol/L (ref 3.5–5.1)
SODIUM: 132 mmol/L — AB (ref 135–145)
TOTAL PROTEIN: 7.5 g/dL (ref 6.5–8.1)
Total Bilirubin: 0.3 mg/dL (ref 0.3–1.2)

## 2018-06-05 LAB — CBC WITH DIFFERENTIAL (CANCER CENTER ONLY)
Abs Immature Granulocytes: 0.04 10*3/uL (ref 0.00–0.07)
BASOS ABS: 0.1 10*3/uL (ref 0.0–0.1)
Basophils Relative: 1 %
EOS ABS: 0.4 10*3/uL (ref 0.0–0.5)
EOS PCT: 2 %
HEMATOCRIT: 41.4 % (ref 36.0–46.0)
Hemoglobin: 12.9 g/dL (ref 12.0–15.0)
Immature Granulocytes: 0 %
LYMPHS ABS: 3 10*3/uL (ref 0.7–4.0)
Lymphocytes Relative: 21 %
MCH: 27.2 pg (ref 26.0–34.0)
MCHC: 31.2 g/dL (ref 30.0–36.0)
MCV: 87.2 fL (ref 80.0–100.0)
MONOS PCT: 6 %
Monocytes Absolute: 0.8 10*3/uL (ref 0.1–1.0)
NRBC: 0 % (ref 0.0–0.2)
Neutro Abs: 10.3 10*3/uL — ABNORMAL HIGH (ref 1.7–7.7)
Neutrophils Relative %: 70 %
Platelet Count: 297 10*3/uL (ref 150–400)
RBC: 4.75 MIL/uL (ref 3.87–5.11)
RDW: 13 % (ref 11.5–15.5)
WBC Count: 14.6 10*3/uL — ABNORMAL HIGH (ref 4.0–10.5)

## 2018-06-06 LAB — FERRITIN: FERRITIN: 323 ng/mL — AB (ref 11–307)

## 2018-06-06 LAB — IRON AND TIBC
Iron: 25 ug/dL — ABNORMAL LOW (ref 41–142)
Saturation Ratios: 9 % — ABNORMAL LOW (ref 21–57)
TIBC: 273 ug/dL (ref 236–444)
UIBC: 247 ug/dL (ref 120–384)

## 2018-06-09 DIAGNOSIS — M531 Cervicobrachial syndrome: Secondary | ICD-10-CM | POA: Diagnosis not present

## 2018-06-09 DIAGNOSIS — M5417 Radiculopathy, lumbosacral region: Secondary | ICD-10-CM | POA: Diagnosis not present

## 2018-06-09 DIAGNOSIS — M9905 Segmental and somatic dysfunction of pelvic region: Secondary | ICD-10-CM | POA: Diagnosis not present

## 2018-06-09 DIAGNOSIS — M9902 Segmental and somatic dysfunction of thoracic region: Secondary | ICD-10-CM | POA: Diagnosis not present

## 2018-06-09 DIAGNOSIS — M9901 Segmental and somatic dysfunction of cervical region: Secondary | ICD-10-CM | POA: Diagnosis not present

## 2018-06-09 DIAGNOSIS — M62830 Muscle spasm of back: Secondary | ICD-10-CM | POA: Diagnosis not present

## 2018-06-09 DIAGNOSIS — M9903 Segmental and somatic dysfunction of lumbar region: Secondary | ICD-10-CM | POA: Diagnosis not present

## 2018-06-10 DIAGNOSIS — E113292 Type 2 diabetes mellitus with mild nonproliferative diabetic retinopathy without macular edema, left eye: Secondary | ICD-10-CM | POA: Diagnosis not present

## 2018-06-11 DIAGNOSIS — M531 Cervicobrachial syndrome: Secondary | ICD-10-CM | POA: Diagnosis not present

## 2018-06-11 DIAGNOSIS — M5417 Radiculopathy, lumbosacral region: Secondary | ICD-10-CM | POA: Diagnosis not present

## 2018-06-11 DIAGNOSIS — M9901 Segmental and somatic dysfunction of cervical region: Secondary | ICD-10-CM | POA: Diagnosis not present

## 2018-06-11 DIAGNOSIS — M9905 Segmental and somatic dysfunction of pelvic region: Secondary | ICD-10-CM | POA: Diagnosis not present

## 2018-06-11 DIAGNOSIS — M9902 Segmental and somatic dysfunction of thoracic region: Secondary | ICD-10-CM | POA: Diagnosis not present

## 2018-06-11 DIAGNOSIS — M9903 Segmental and somatic dysfunction of lumbar region: Secondary | ICD-10-CM | POA: Diagnosis not present

## 2018-06-11 DIAGNOSIS — M62830 Muscle spasm of back: Secondary | ICD-10-CM | POA: Diagnosis not present

## 2018-06-16 DIAGNOSIS — M62830 Muscle spasm of back: Secondary | ICD-10-CM | POA: Diagnosis not present

## 2018-06-16 DIAGNOSIS — M9902 Segmental and somatic dysfunction of thoracic region: Secondary | ICD-10-CM | POA: Diagnosis not present

## 2018-06-16 DIAGNOSIS — M9905 Segmental and somatic dysfunction of pelvic region: Secondary | ICD-10-CM | POA: Diagnosis not present

## 2018-06-16 DIAGNOSIS — M9903 Segmental and somatic dysfunction of lumbar region: Secondary | ICD-10-CM | POA: Diagnosis not present

## 2018-06-16 DIAGNOSIS — M5417 Radiculopathy, lumbosacral region: Secondary | ICD-10-CM | POA: Diagnosis not present

## 2018-06-16 DIAGNOSIS — M9901 Segmental and somatic dysfunction of cervical region: Secondary | ICD-10-CM | POA: Diagnosis not present

## 2018-06-16 DIAGNOSIS — M531 Cervicobrachial syndrome: Secondary | ICD-10-CM | POA: Diagnosis not present

## 2018-06-19 ENCOUNTER — Other Ambulatory Visit: Payer: Self-pay | Admitting: Internal Medicine

## 2018-06-19 DIAGNOSIS — M9902 Segmental and somatic dysfunction of thoracic region: Secondary | ICD-10-CM | POA: Diagnosis not present

## 2018-06-19 DIAGNOSIS — M62830 Muscle spasm of back: Secondary | ICD-10-CM | POA: Diagnosis not present

## 2018-06-19 DIAGNOSIS — M9903 Segmental and somatic dysfunction of lumbar region: Secondary | ICD-10-CM | POA: Diagnosis not present

## 2018-06-19 DIAGNOSIS — M9901 Segmental and somatic dysfunction of cervical region: Secondary | ICD-10-CM | POA: Diagnosis not present

## 2018-06-19 DIAGNOSIS — M5417 Radiculopathy, lumbosacral region: Secondary | ICD-10-CM | POA: Diagnosis not present

## 2018-06-19 DIAGNOSIS — M9905 Segmental and somatic dysfunction of pelvic region: Secondary | ICD-10-CM | POA: Diagnosis not present

## 2018-06-19 DIAGNOSIS — M531 Cervicobrachial syndrome: Secondary | ICD-10-CM | POA: Diagnosis not present

## 2018-06-19 MED FILL — UNIFINE PENTIPS 31GX3/16": 31G X 5 MM | 90 days supply | Qty: 100 | Fill #2

## 2018-06-19 MED FILL — UNIFINE PENTIPS 31GX3/16: 31G X 5 MM | 90 days supply | Qty: 100 | Fill #2

## 2018-06-20 MED FILL — OZEMPIC 0.25 OR 0.5 MG/DOSE: 2 | 28 days supply | Qty: 2 | Fill #0

## 2018-06-23 DIAGNOSIS — M9901 Segmental and somatic dysfunction of cervical region: Secondary | ICD-10-CM | POA: Diagnosis not present

## 2018-06-23 DIAGNOSIS — M9905 Segmental and somatic dysfunction of pelvic region: Secondary | ICD-10-CM | POA: Diagnosis not present

## 2018-06-23 DIAGNOSIS — M62830 Muscle spasm of back: Secondary | ICD-10-CM | POA: Diagnosis not present

## 2018-06-23 DIAGNOSIS — M5417 Radiculopathy, lumbosacral region: Secondary | ICD-10-CM | POA: Diagnosis not present

## 2018-06-23 DIAGNOSIS — M9903 Segmental and somatic dysfunction of lumbar region: Secondary | ICD-10-CM | POA: Diagnosis not present

## 2018-06-23 DIAGNOSIS — M9902 Segmental and somatic dysfunction of thoracic region: Secondary | ICD-10-CM | POA: Diagnosis not present

## 2018-06-23 DIAGNOSIS — M531 Cervicobrachial syndrome: Secondary | ICD-10-CM | POA: Diagnosis not present

## 2018-06-30 ENCOUNTER — Encounter: Payer: Self-pay | Admitting: Internal Medicine

## 2018-07-02 ENCOUNTER — Other Ambulatory Visit: Payer: Self-pay | Admitting: Internal Medicine

## 2018-07-02 MED FILL — VALACYCLOVIR HCL 500 MG TAB: 500 | 30 days supply | Qty: 36 | Fill #0

## 2018-07-02 MED FILL — FARXIGA 10 MG TABLET: 10 | 90 days supply | Qty: 90 | Fill #2

## 2018-07-02 MED FILL — FOLIC ACID 1 MG TABS: 1 | 30 days supply | Qty: 30 | Fill #5

## 2018-07-02 MED FILL — GABAPENTIN 600 MG TABLET: 600 | 30 days supply | Qty: 60 | Fill #2

## 2018-07-03 MED FILL — PIOGLITAZONE HCL 15 MG TAB: 15 | 30 days supply | Qty: 30 | Fill #0

## 2018-07-03 MED FILL — MELOXICAM 7.5 MG TABLET: 7.5 | 30 days supply | Qty: 30 | Fill #0

## 2018-07-03 MED FILL — FUSION PLUS CAPSULE: 30 days supply | Qty: 30 | Fill #0

## 2018-07-03 MED FILL — OZEMPIC 0.25 OR 0.5 MG/DOSE: 2 | 28 days supply | Qty: 2 | Fill #0

## 2018-07-15 ENCOUNTER — Telehealth: Payer: Self-pay

## 2018-07-15 NOTE — Telephone Encounter (Signed)
Left the pt a message to call the office back about her labs.

## 2018-07-17 ENCOUNTER — Encounter: Payer: Self-pay | Admitting: Internal Medicine

## 2018-07-17 ENCOUNTER — Other Ambulatory Visit: Payer: Self-pay

## 2018-07-17 ENCOUNTER — Ambulatory Visit: Payer: 59 | Admitting: Internal Medicine

## 2018-07-17 VITALS — BP 110/72 | HR 106 | Temp 98.3°F | Ht 60.8 in | Wt 206.4 lb

## 2018-07-17 DIAGNOSIS — R413 Other amnesia: Secondary | ICD-10-CM

## 2018-07-17 DIAGNOSIS — E1141 Type 2 diabetes mellitus with diabetic mononeuropathy: Secondary | ICD-10-CM

## 2018-07-17 DIAGNOSIS — M5416 Radiculopathy, lumbar region: Secondary | ICD-10-CM

## 2018-07-17 DIAGNOSIS — R000 Tachycardia, unspecified: Secondary | ICD-10-CM | POA: Diagnosis not present

## 2018-07-17 DIAGNOSIS — R194 Change in bowel habit: Secondary | ICD-10-CM

## 2018-07-17 DIAGNOSIS — Z6839 Body mass index (BMI) 39.0-39.9, adult: Secondary | ICD-10-CM | POA: Diagnosis not present

## 2018-07-17 MED ORDER — SUMATRIPTAN SUCCINATE 50 MG PO TABS: 50.0000 mg | ORAL_TABLET | ORAL | 2 refills | Status: DC | PRN

## 2018-07-17 MED FILL — SUMAtriptan SUCCINATE 50 MG: 50 | 30 days supply | Qty: 10 | Fill #0

## 2018-07-17 NOTE — Progress Notes (Signed)
Subjective:     Patient ID: Michelle Tanner , female    DOB: 05-23-1970 , 48 y.o.   MRN: [id number]   Chief Complaint  Patient presents with  . Diabetes    HPI  Diabetes  She presents for her follow-up diabetic visit. She has type 2 diabetes mellitus. Her disease course has been improving. There are no hypoglycemic associated symptoms. Pertinent negatives for diabetes include no blurred vision and no chest pain. There are no hypoglycemic complications. Risk factors for coronary artery disease include diabetes mellitus, dyslipidemia and hypertension. She never participates in exercise. Her home blood glucose trend is decreasing steadily. An ACE inhibitor/angiotensin II receptor blocker is not being taken.     Past Medical History:  Diagnosis Date  . Diabetes mellitus   . Frozen shoulder    left  . Hx of migraine headaches   . Migraines   . Neuromuscular disorder ([place])    diabetic neuropathy     Family History  Problem Relation Age of Onset  . Rectal cancer Paternal Grandmother   . Diabetes Father   . Kidney disease Father   . CAD Father   . Cancer Mother   . Cancer Paternal Uncle   . Cancer Paternal Aunt      Current Outpatient Medications:  .  Cholecalciferol (VITAMIN D PO), Take by mouth., Disp: , Rfl:  .  dapagliflozin propanediol (FARXIGA) 10 MG TABS tablet, Take 10 mg by mouth daily., Disp: , Rfl:  .  FeFum-FePoly-FA-B Cmp-C-Biot (FOLIVANE-PLUS PO), Take 1 capsule by mouth daily., Disp: , Rfl:  .  folic acid (FOLVITE) 1 MG tablet, Take 1 tablet (1 mg total) by mouth daily., Disp: 30 tablet, Rfl: 11 .  gabapentin (NEURONTIN) 600 MG tablet, TAKE 1 TABLET BY MOUTH TWICE DAILY, Disp: 60 tablet, Rfl: 5 .  ibuprofen (ADVIL,MOTRIN) 800 MG tablet, Take 800 mg by mouth every 8 (eight) hours as needed. For pain, Disp: , Rfl:  .  magnesium 30 MG tablet, Take 30 mg by mouth 2 (two) times daily., Disp: , Rfl:  .  meloxicam (MOBIC) 7.5 MG tablet, TAKE 1 TABLET BY MOUTH DAILY,  Disp: 30 tablet, Rfl: 5 .  metFORMIN (GLUCOPHAGE) 1000 MG tablet, TAKE 1 TABLET BY MOUTH TWO TIMES A DAY WITH MORNING AND EVENING MEALS, Disp: 180 tablet, Rfl: 1 .  Multiple Vitamin (MULITIVITAMIN WITH MINERALS) TABS, Take 1 tablet by mouth daily., Disp: , Rfl:  .  nystatin-triamcinolone (MYCOLOG II) cream, Apply topically 4 (four) times daily., Disp: 30 g, Rfl: 0 .  OZEMPIC, 0.25 OR 0.5 MG/DOSE, 2 MG/1.5ML SOPN, INJECT 0.5MG UNDER THE SKIN ON THE SAME DAY OF EACH WEEK, IN THE ABDOMEN, THIGHS, OR UPPER ARM ROTATING INJECTION SITES, Disp: 1.5 mL, Rfl: 3 .  TRESIBA FLEXTOUCH 200 UNIT/ML SOPN, INJECT 30 UNITS UNDER THE SKIN AT BEDTIME (MAX DOSE IS 80 UNITS), Disp: 9 mL, Rfl: 1 .  valACYclovir (VALTREX) 500 MG tablet, Take 500 mg by mouth 3 (three) times daily., Disp: , Rfl: 0 .  SUMAtriptan (IMITREX) 50 MG tablet, Take 1 tablet (50 mg total) by mouth every 2 (two) hours as needed., Disp: 10 tablet, Rfl: 2   Allergies  Allergen Reactions  . Latex Hives     Review of Systems  Constitutional: Negative.   Eyes: Negative for blurred vision.  Respiratory: Negative.   Cardiovascular: Negative.  Negative for chest pain.  Gastrointestinal: Negative.   Neurological: Numbness: has tingling/numbness in LLE.       She  c/o memory loss.  She reports she has issues with remembering names. This has started within the past several months. She is not sure what triggered her symptoms.  Psychiatric/Behavioral: Negative.      Today's Vitals   07/17/18 1204  BP: 110/72  Pulse: (!) 106  Temp: 98.3 F (36.8 C)  TempSrc: Oral  Weight: 206 lb 6.4 oz (93.6 kg)  Height: 5' 0.8" (1.544 m)   Body mass index is 39.26 kg/m.   Objective:  Physical Exam Vitals signs and nursing note reviewed.  Constitutional:      Appearance: Normal appearance.  HENT:     Head: Normocephalic and atraumatic.  Cardiovascular:     Rate and Rhythm: Normal rate and regular rhythm.     Heart sounds: Normal heart sounds.   Pulmonary:     Effort: Pulmonary effort is normal.     Breath sounds: Normal breath sounds.  Skin:    General: Skin is warm.  Neurological:     General: No focal deficit present.     Mental Status: She is alert.  Psychiatric:        Mood and Affect: Mood normal.        Behavior: Behavior normal.         Assessment And Plan:     1. Diabetic mononeuropathy associated with type 2 diabetes mellitus ([place])  I will check labs as listed below.  Importance of regular exercise was discussed with the patient.   - CMP14+EGFR - Hemoglobin A1c  2. Tachycardia  She is encouraged to increase her fluid intake. If persistent, I will need to initiate low dose B-blocker. She is also encouraged to take magnesium 455m nightly.   3. Lumbar radiculopathy  - MR Lumbar Spine Wo Contrast; Future  4. Change in stool habits  I will refer her to GI for CRC screening. Pt also advised that her symptoms could be a result of the Ozempic.   5. Memory loss  I will check lipids as stated below. Pt advised elevated sugars could have an impact on her brain function.   - Vitamin B12 - TSH - RPR  6. Class 2 severe obesity due to excess calories with serious comorbidity and body mass index (BMI) of 39.0 to 39.9 in adult ([facility]  Importance of achieving optimal weight to decrease risk of cardiovascular disease and cancers was discussed with the patient in full detail. She is encouraged to start slowly - start with 10 minutes twice daily at least three to four days per week and to gradually build to 30 minutes five days weekly. She was given tips to incorporate more activity into her daily routine - take stairs when possible, park farther away from her job, grocery stores, etc.      RMaximino Greenland MD

## 2018-07-18 LAB — CMP14+EGFR
ALT: 15 IU/L (ref 0–32)
AST: 20 IU/L (ref 0–40)
Albumin/Globulin Ratio: 1.3 (ref 1.2–2.2)
Albumin: 3.8 g/dL (ref 3.8–4.8)
Alkaline Phosphatase: 113 IU/L (ref 39–117)
BUN/Creatinine Ratio: 10 (ref 9–23)
BUN: 6 mg/dL (ref 6–24)
Bilirubin Total: <0.2 mg/dL (ref 0.0–1.2)
CO2: 22 mmol/L (ref 20–29)
Calcium: 9.3 mg/dL (ref 8.7–10.2)
Chloride: 100 mmol/L (ref 96–106)
Creatinine, Ser: 0.63 mg/dL (ref 0.57–1.00)
GFR calc non Af Amer: 107 mL/min/{1.73_m2} (ref >59)
GFR, EST AFRICAN AMERICAN: 124 mL/min/{1.73_m2} (ref >59)
Globulin, Total: 2.9 g/dL (ref 1.5–4.5)
Glucose: 257 mg/dL — ABNORMAL HIGH (ref 65–99)
Potassium: 3.8 mmol/L (ref 3.5–5.2)
Sodium: 138 mmol/L (ref 134–144)
Total Protein: 6.7 g/dL (ref 6.0–8.5)

## 2018-07-18 LAB — RPR: RPR Ser Ql: NONREACTIVE

## 2018-07-18 LAB — VITAMIN B12: Vitamin B-12: 382 pg/mL (ref 232–1245)

## 2018-07-18 LAB — HEMOGLOBIN A1C
Est. average glucose Bld gHb Est-mCnc: 192 mg/dL
Hgb A1c MFr Bld: 8.3 % — ABNORMAL HIGH (ref 4.8–5.6)

## 2018-07-18 LAB — TSH: TSH: 2.28 u[IU]/mL (ref 0.450–4.500)

## 2018-07-28 ENCOUNTER — Other Ambulatory Visit: Payer: 59

## 2018-07-28 ENCOUNTER — Ambulatory Visit: Payer: 59 | Admitting: Family

## 2018-07-30 MED FILL — GABAPENTIN 600 MG TABLET: 600 | 30 days supply | Qty: 60 | Fill #3 | Status: TO

## 2018-08-04 ENCOUNTER — Other Ambulatory Visit: Payer: Self-pay

## 2018-08-04 ENCOUNTER — Inpatient Hospital Stay (HOSPITAL_BASED_OUTPATIENT_CLINIC_OR_DEPARTMENT_OTHER): Payer: 59 | Admitting: Family

## 2018-08-04 ENCOUNTER — Inpatient Hospital Stay: Payer: 59 | Attending: Family

## 2018-08-04 DIAGNOSIS — D509 Iron deficiency anemia, unspecified: Secondary | ICD-10-CM | POA: Insufficient documentation

## 2018-08-04 DIAGNOSIS — R200 Anesthesia of skin: Secondary | ICD-10-CM | POA: Insufficient documentation

## 2018-08-04 DIAGNOSIS — D560 Alpha thalassemia: Secondary | ICD-10-CM

## 2018-08-04 DIAGNOSIS — D500 Iron deficiency anemia secondary to blood loss (chronic): Secondary | ICD-10-CM

## 2018-08-04 LAB — CBC WITH DIFFERENTIAL (CANCER CENTER ONLY)
Abs Immature Granulocytes: 0.05 10*3/uL (ref 0.00–0.07)
Basophils Absolute: 0.1 10*3/uL (ref 0.0–0.1)
Basophils Relative: 1 %
EOS ABS: 0.5 10*3/uL (ref 0.0–0.5)
Eosinophils Relative: 4 %
HCT: 40.3 % (ref 36.0–46.0)
Hemoglobin: 12.5 g/dL (ref 12.0–15.0)
Immature Granulocytes: 0 %
Lymphocytes Relative: 19 %
Lymphs Abs: 2.6 10*3/uL (ref 0.7–4.0)
MCH: 27 pg (ref 26.0–34.0)
MCHC: 31 g/dL (ref 30.0–36.0)
MCV: 87 fL (ref 80.0–100.0)
Monocytes Absolute: 0.8 10*3/uL (ref 0.1–1.0)
Monocytes Relative: 6 %
Neutro Abs: 9.2 10*3/uL — ABNORMAL HIGH (ref 1.7–7.7)
Neutrophils Relative %: 70 %
Platelet Count: 398 10*3/uL (ref 150–400)
RBC: 4.63 MIL/uL (ref 3.87–5.11)
RDW: 12.9 % (ref 11.5–15.5)
WBC Count: 13.2 10*3/uL — ABNORMAL HIGH (ref 4.0–10.5)
nRBC: 0 % (ref 0.0–0.2)

## 2018-08-04 LAB — CMP (CANCER CENTER ONLY)
ALT: 19 U/L (ref 0–44)
AST: 20 U/L (ref 15–41)
Albumin: 4 g/dL (ref 3.5–5.0)
Alkaline Phosphatase: 97 U/L (ref 38–126)
Anion gap: 6 (ref 5–15)
BUN: 11 mg/dL (ref 6–20)
CO2: 30 mmol/L (ref 22–32)
CREATININE: 0.63 mg/dL (ref 0.44–1.00)
Calcium: 9.6 mg/dL (ref 8.9–10.3)
Chloride: 102 mmol/L (ref 98–111)
GFR, Est AFR Am: >60 mL/min (ref >60)
GFR, Estimated: >60 mL/min (ref >60)
Glucose, Bld: 128 mg/dL — ABNORMAL HIGH (ref 70–99)
Potassium: 4 mmol/L (ref 3.5–5.1)
SODIUM: 138 mmol/L (ref 135–145)
Total Bilirubin: 0.2 mg/dL — ABNORMAL LOW (ref 0.3–1.2)
Total Protein: 7.2 g/dL (ref 6.5–8.1)

## 2018-08-04 NOTE — Progress Notes (Signed)
Hematology and Oncology Follow Up Visit  Michelle Tanner [id number] 1970-12-29 48 y.o. 08/04/2018   Principle Diagnosis:  Iron deficiency anemia Alpha thalassemia  Current Therapy:   IV iron as indicated Folic acid 1 mg PO daily   Interim History:  Michelle Tanner is here today for follow-up. She never came in for her scheduled iron infusions in November. She is symptomatic with fatigue, palpitations and lightheadedness.  Hgb is stable at 12.5 and MCV 87. Iron studies are pending.  She denies any episodes of bleeding. No bruising or petechiae.  No fever, chills, n/v, cough, rash, dizziness, SOB, chest pain, abdominal pain or changes in bowel or bladder habits.  She takes an iron supplement and folic acid daily.  She has had some mild constipation with the iron supplement.  She states that she has chronic numbness, tingling and puffiness in her left foot. This waxes and wanes.  No falls or syncopal episodes.  No lymphadenopathy noted on exam.  She has maintained a good appetite and is staying well hydrated. Her weight is stable.   ECOG Performance Status: 1 - Symptomatic but completely ambulatory  Medications:  Allergies as of 08/04/2018      Reactions   Latex Hives      Medication List       Accurate as of August 04, 2018  3:50 PM. Always use your most recent med list.        Farxiga 10 MG Tabs tablet Generic drug:  dapagliflozin propanediol Take 10 mg by mouth daily.   folic acid 1 MG tablet Commonly known as:  FOLVITE Take 1 tablet (1 mg total) by mouth daily.   FOLIVANE-PLUS PO Take 1 capsule by mouth daily.   gabapentin 600 MG tablet Commonly known as:  NEURONTIN TAKE 1 TABLET BY MOUTH TWICE DAILY   ibuprofen 800 MG tablet Commonly known as:  ADVIL,MOTRIN Take 800 mg by mouth every 8 (eight) hours as needed. For pain   magnesium 30 MG tablet Take 30 mg by mouth 2 (two) times daily.   meloxicam 7.5 MG tablet Commonly known as:  MOBIC TAKE 1 TABLET BY  MOUTH DAILY   metFORMIN 1000 MG tablet Commonly known as:  GLUCOPHAGE TAKE 1 TABLET BY MOUTH TWO TIMES A DAY WITH MORNING AND EVENING MEALS   multivitamin with minerals Tabs tablet Take 1 tablet by mouth daily.   nystatin-triamcinolone cream Commonly known as:  MYCOLOG II Apply topically 4 (four) times daily.   Ozempic (0.25 or 0.5 MG/DOSE) 2 MG/1.5ML Sopn Generic drug:  Semaglutide(0.25 or 0.5MG /DOS) INJECT 0.5MG  UNDER THE SKIN ON THE SAME DAY OF EACH WEEK, IN THE ABDOMEN, THIGHS, OR UPPER ARM ROTATING INJECTION SITES   SUMAtriptan 50 MG tablet Commonly known as:  IMITREX Take 1 tablet (50 mg total) by mouth every 2 (two) hours as needed.   Tyler Aas FlexTouch 200 UNIT/ML Sopn Generic drug:  Insulin Degludec INJECT 30 UNITS UNDER THE SKIN AT BEDTIME (MAX DOSE IS 80 UNITS)   valACYclovir 500 MG tablet Commonly known as:  VALTREX Take 500 mg by mouth 3 (three) times daily.   VITAMIN D PO Take by mouth.       Allergies:  Allergies  Allergen Reactions  . Latex Hives    Past Medical History, Surgical history, Social history, and Family History were reviewed and updated.  Review of Systems: All other 10 point review of systems is negative.   Physical Exam:  vitals were not taken for this visit.   Wt  Readings from Last 3 Encounters:  07/17/18 206 lb 6.4 oz (93.6 kg)  03/31/18 203 lb (92.1 kg)  03/13/18 197 lb 3.2 oz (89.4 kg)    Ocular: Sclerae unicteric, pupils equal, round and reactive to light Ear-nose-throat: Oropharynx clear, dentition fair Lymphatic: No cervical, supraclavicular or axillary adenopathy Lungs no rales or rhonchi, good excursion bilaterally Heart regular rate and rhythm, no murmur appreciated Abd soft, nontender, positive bowel sounds, no liver or spleen tip palpated on exam, no fluid wave  MSK no focal spinal tenderness, no joint edema Neuro: non-focal, well-oriented, appropriate affect Breasts: Deferred   Lab Results  Component Value  Date   WBC 13.2 (H) 08/04/2018   HGB 12.5 08/04/2018   HCT 40.3 08/04/2018   MCV 87.0 08/04/2018   PLT 398 08/04/2018   Lab Results  Component Value Date   FERRITIN 323 (H) 06/05/2018   IRON 25 (L) 06/05/2018   TIBC 273 06/05/2018   UIBC 247 06/05/2018   IRONPCTSAT 9 (L) 06/05/2018   Lab Results  Component Value Date   RETICCTPCT 1.1 09/26/2017   RBC 4.63 08/04/2018   No results found for: KPAFRELGTCHN, LAMBDASER, KAPLAMBRATIO No results found for: IGGSERUM, IGA, IGMSERUM No results found for: Odetta Pink, SPEI   Chemistry      Component Value Date/Time   NA 138 08/04/2018 1405   NA 138 07/17/2018 1409   K 4.0 08/04/2018 1405   CL 102 08/04/2018 1405   CO2 30 08/04/2018 1405   BUN 11 08/04/2018 1405   BUN 6 07/17/2018 1409   CREATININE 0.63 08/04/2018 1405   GLU 78 08/29/2017      Component Value Date/Time   CALCIUM 9.6 08/04/2018 1405   ALKPHOS 97 08/04/2018 1405   AST 20 08/04/2018 1405   ALT 19 08/04/2018 1405   BILITOT 0.2 (L) 08/04/2018 1405       Impression and Plan: Michelle Tanner is a very pleasant 48 yo female with iron deficiency anemia. She never received IV iron for her last set of low iron.  We will bring her back in for IV iron if needed.  We will schedule her follow-up once we have her lab results.  She will contact our office with any questions or concerns.   Laverna Peace, NP 3/16/20203:50 PM

## 2018-08-05 ENCOUNTER — Telehealth: Payer: Self-pay | Admitting: Family

## 2018-08-05 ENCOUNTER — Other Ambulatory Visit: Payer: Self-pay

## 2018-08-05 ENCOUNTER — Ambulatory Visit
Admission: RE | Admit: 2018-08-05 | Discharge: 2018-08-05 | Disposition: A | Discharge status: Home / Self Care | Payer: 59 | Source: Ambulatory Visit | Attending: Internal Medicine | Admitting: Internal Medicine

## 2018-08-05 ENCOUNTER — Telehealth: Payer: Self-pay | Admitting: Hematology & Oncology

## 2018-08-05 DIAGNOSIS — M5416 Radiculopathy, lumbar region: Secondary | ICD-10-CM

## 2018-08-05 LAB — IRON AND TIBC
Iron: 46 ug/dL (ref 41–142)
Saturation Ratios: 19 % — ABNORMAL LOW (ref 21–57)
TIBC: 248 ug/dL (ref 236–444)
UIBC: 202 ug/dL (ref 120–384)

## 2018-08-05 LAB — FERRITIN: Ferritin: 249 ng/mL (ref 11–307)

## 2018-08-05 NOTE — Telephone Encounter (Signed)
Appointments scheduled LMVM for patient with date/time per 3/17 sch msg

## 2018-08-05 NOTE — Telephone Encounter (Signed)
NO 3/16 LOS

## 2018-08-07 MED FILL — MELOXICAM 7.5 MG TABLET: 7.5 | 30 days supply | Qty: 30 | Fill #1 | Status: TO

## 2018-08-07 MED FILL — OZEMPIC 0.25 OR 0.5 MG/DOSE: 2 | 28 days supply | Qty: 2 | Fill #1 | Status: TO

## 2018-08-07 MED FILL — FOLIC ACID 1 MG TABS: 1 | 30 days supply | Qty: 30 | Fill #6 | Status: TO

## 2018-08-07 MED FILL — FUSION PLUS CAPSULE: 30 days supply | Qty: 30 | Fill #1

## 2018-08-28 ENCOUNTER — Other Ambulatory Visit: Payer: Self-pay | Admitting: Internal Medicine

## 2018-08-28 MED FILL — OZEMPIC 0.25 OR 0.5 MG/DOSE: 2 | 28 days supply | Qty: 2 | Fill #0

## 2018-08-28 MED FILL — GABAPENTIN 600 MG TABLET: 600 | 30 days supply | Qty: 60 | Fill #0

## 2018-08-28 MED FILL — FREESTYLE LIBRE 14 DAY SENS: 28 days supply | Qty: 2 | Fill #0

## 2018-08-28 MED FILL — MELOXICAM 7.5 MG TABLET: 7.5 | 30 days supply | Qty: 30 | Fill #0

## 2018-08-28 MED FILL — TRESIBA FLEXTOUCH 200 UNITS: 200 | 23 days supply | Qty: 9 | Fill #0

## 2018-08-28 MED FILL — FOLIC ACID 1 MG TABS: 1 | 30 days supply | Qty: 30 | Fill #0

## 2018-08-28 MED FILL — MAGNESIUM OXIDE 250 MG TAB: 250 | 30 days supply | Qty: 30 | Fill #0

## 2018-09-01 MED FILL — metFORMIN HCL 1000 MG TABS: 1000 | 90 days supply | Qty: 180 | Fill #0

## 2018-09-04 DIAGNOSIS — Z1211 Encounter for screening for malignant neoplasm of colon: Secondary | ICD-10-CM | POA: Diagnosis not present

## 2018-09-04 DIAGNOSIS — R194 Change in bowel habit: Secondary | ICD-10-CM | POA: Diagnosis not present

## 2018-09-04 DIAGNOSIS — D509 Iron deficiency anemia, unspecified: Secondary | ICD-10-CM | POA: Diagnosis not present

## 2018-09-05 MED FILL — PEG-3350 AND ELECTROLYTES S: 236 | 1 days supply | Qty: 4000 | Fill #0

## 2018-10-01 ENCOUNTER — Other Ambulatory Visit: Payer: Self-pay | Admitting: Internal Medicine

## 2018-10-01 MED FILL — UNIFINE PENTIPS 31GX3/16: 31G X 5 MM | 90 days supply | Qty: 100 | Fill #0

## 2018-10-01 MED FILL — GABAPENTIN 600 MG TABLET: 600 | 30 days supply | Qty: 60 | Fill #1

## 2018-10-01 MED FILL — UNIFINE PENTIPS 31GX3/16": 31G X 5 MM | 90 days supply | Qty: 100 | Fill #0

## 2018-10-15 ENCOUNTER — Encounter: Payer: 59 | Admitting: Internal Medicine

## 2018-10-16 ENCOUNTER — Ambulatory Visit: Payer: 59 | Admitting: Internal Medicine

## 2018-10-29 ENCOUNTER — Other Ambulatory Visit: Payer: Self-pay | Admitting: Family

## 2018-10-29 ENCOUNTER — Other Ambulatory Visit: Payer: Self-pay | Admitting: Internal Medicine

## 2018-10-29 DIAGNOSIS — D560 Alpha thalassemia: Secondary | ICD-10-CM

## 2018-10-29 MED FILL — VALACYCLOVIR HCL 500 MG TAB: 500 | 30 days supply | Qty: 36 | Fill #0

## 2018-10-29 MED FILL — FOLIC ACID 1 MG TABS: 1 | 30 days supply | Qty: 30 | Fill #0

## 2018-10-29 MED FILL — GABAPENTIN 600 MG TABLET: 600 | 30 days supply | Qty: 60 | Fill #0

## 2018-10-30 ENCOUNTER — Other Ambulatory Visit: Payer: Self-pay | Admitting: Internal Medicine

## 2018-11-11 MED FILL — AMOXICILLIN 500 MG CAPSULE: 500 | 7 days supply | Qty: 21 | Fill #0

## 2018-11-12 ENCOUNTER — Other Ambulatory Visit: Payer: Self-pay

## 2018-11-12 ENCOUNTER — Ambulatory Visit: Payer: 59 | Admitting: Internal Medicine

## 2018-11-12 ENCOUNTER — Encounter: Payer: Self-pay | Admitting: Internal Medicine

## 2018-11-12 VITALS — BP 110/78 | HR 91 | Temp 98.2°F | Ht 60.8 in | Wt 189.0 lb

## 2018-11-12 DIAGNOSIS — Z6835 Body mass index (BMI) 35.0-35.9, adult: Secondary | ICD-10-CM | POA: Diagnosis not present

## 2018-11-12 DIAGNOSIS — G8929 Other chronic pain: Secondary | ICD-10-CM | POA: Diagnosis not present

## 2018-11-12 DIAGNOSIS — M25512 Pain in left shoulder: Secondary | ICD-10-CM

## 2018-11-12 DIAGNOSIS — M25511 Pain in right shoulder: Secondary | ICD-10-CM | POA: Diagnosis not present

## 2018-11-12 DIAGNOSIS — Z23 Encounter for immunization: Secondary | ICD-10-CM | POA: Diagnosis not present

## 2018-11-12 DIAGNOSIS — E1141 Type 2 diabetes mellitus with diabetic mononeuropathy: Secondary | ICD-10-CM | POA: Diagnosis not present

## 2018-11-12 MED ORDER — PNEUMOCOCCAL 13-VAL CONJ VACC IM SUSP: 0.5000 mL | INTRAMUSCULAR | 0 refills | Status: AC

## 2018-11-12 MED ORDER — TRAMADOL HCL 50 MG PO TABS: 50.0000 mg | ORAL_TABLET | Freq: Four times a day (QID) | ORAL | 0 refills | Status: DC | PRN

## 2018-11-12 MED FILL — traMADol HCL 50 MG TABS: 50 | 8 days supply | Qty: 30 | Fill #0

## 2018-11-12 NOTE — Progress Notes (Signed)
Subjective:     Patient ID: Michelle Tanner , female    DOB: Oct 14, 1970 , 48 y.o.   MRN: [id number]   Chief Complaint  Patient presents with  . Diabetes  . Immunizations    HPI  She presents today for dm check. She is also aware she may be due for some immunizations.   Diabetes She presents for her follow-up diabetic visit. She has type 2 diabetes mellitus. Her disease course has been improving. There are no hypoglycemic associated symptoms. Pertinent negatives for diabetes include no blurred vision and no chest pain. There are no hypoglycemic complications. Risk factors for coronary artery disease include diabetes mellitus, dyslipidemia and hypertension. She never participates in exercise. Her home blood glucose trend is decreasing steadily. An ACE inhibitor/angiotensin II receptor blocker is not being taken.     Past Medical History:  Diagnosis Date  . Diabetes mellitus   . Frozen shoulder    left  . Hx of migraine headaches   . Migraines   . Neuromuscular disorder ([place])    diabetic neuropathy     Family History  Problem Relation Age of Onset  . Rectal cancer Paternal Grandmother   . Diabetes Father   . Kidney disease Father   . CAD Father   . Cancer Mother   . Cancer Paternal Uncle   . Cancer Paternal Aunt      Current Outpatient Medications:  .  Cholecalciferol (VITAMIN D PO), Take by mouth., Disp: , Rfl:  .  dapagliflozin propanediol (FARXIGA) 10 MG TABS tablet, Take 10 mg by mouth daily., Disp: , Rfl:  .  FeFum-FePoly-FA-B Cmp-C-Biot (FOLIVANE-PLUS PO), Take 1 capsule by mouth daily., Disp: , Rfl:  .  folic acid (FOLVITE) 1 MG tablet, TAKE 1 TABLET (1 MG TOTAL) BY MOUTH DAILY., Disp: 30 tablet, Rfl: 0 .  gabapentin (NEURONTIN) 600 MG tablet, TAKE 1 TABLET BY MOUTH TWICE DAILY, Disp: 60 tablet, Rfl: 1 .  ibuprofen (ADVIL,MOTRIN) 800 MG tablet, Take 800 mg by mouth every 8 (eight) hours as needed. For pain, Disp: , Rfl:  .  Iron-FA-B Cmp-C-Biot-Probiotic  (FUSION PLUS) CAPS, TAKE 1 CAPSULE BY MOUTH DAILY BETWEEN MEALS, Disp: 30 capsule, Rfl: 3 .  metFORMIN (GLUCOPHAGE) 1000 MG tablet, TAKE 1 TABLET BY MOUTH TWO TIMES A DAY WITH MORNING AND EVENING MEALS, Disp: 180 tablet, Rfl: 1 .  Multiple Vitamin (MULITIVITAMIN WITH MINERALS) TABS, Take 1 tablet by mouth daily., Disp: , Rfl:  .  nystatin-triamcinolone (MYCOLOG II) cream, Apply topically 4 (four) times daily., Disp: 30 g, Rfl: 0 .  OZEMPIC, 0.25 OR 0.5 MG/DOSE, 2 MG/1.5ML SOPN, INJECT 0.'5MG'$  UNDER THE SKIN ON THE SAME DAY OF EACH WEEK, IN THE ABDOMEN, THIGHS, OR UPPER ARM ROTATING INJECTION SITES, Disp: 1.5 mL, Rfl: 3 .  SUMAtriptan (IMITREX) 50 MG tablet, Take 1 tablet (50 mg total) by mouth every 2 (two) hours as needed., Disp: 10 tablet, Rfl: 2 .  TRESIBA FLEXTOUCH 200 UNIT/ML SOPN, INJECT 30 UNITS UNDER THE SKIN AT BEDTIME (MAX DOSE IS 80 UNITS), Disp: 9 mL, Rfl: 1 .  UNIFINE PENTIPS 31G X 5 MM MISC, USE AS DIRECTED WITH VICTOZA PEN, Disp: 100 each, Rfl: 4 .  valACYclovir (VALTREX) 500 MG tablet, Take 500 mg by mouth 3 (three) times daily., Disp: , Rfl: 0 .  pneumococcal 13-valent conjugate vaccine (PREVNAR 13) SUSP injection, Inject 0.5 mLs into the muscle tomorrow at 10 am for 1 dose., Disp: 0.5 mL, Rfl: 0 .  traMADol (ULTRAM) 50  MG tablet, Take 1 tablet (50 mg total) by mouth every 6 (six) hours as needed., Disp: 30 tablet, Rfl: 0   Allergies  Allergen Reactions  . Latex Hives     Review of Systems  Constitutional: Negative.   Eyes: Negative for blurred vision.  Respiratory: Negative.   Cardiovascular: Negative.  Negative for chest pain.  Gastrointestinal: Negative.   Musculoskeletal: Positive for arthralgias.       She c/o b/l shoulder pain. She is able to raise her arms b/l, but it is painful. She denies fall/trauma.   Neurological: Negative.   Psychiatric/Behavioral: Negative.      Today's Vitals   11/12/18 1043  BP: 110/78  Pulse: 91  Temp: 98.2 F (36.8 C)  TempSrc: Oral   Weight: 189 lb (85.7 kg)  Height: 5' 0.8" (1.544 m)   Body mass index is 35.95 kg/m.   Objective:  Physical Exam Vitals signs and nursing note reviewed.  Constitutional:      Appearance: Normal appearance.  HENT:     Head: Normocephalic and atraumatic.  Cardiovascular:     Rate and Rhythm: Normal rate and regular rhythm.     Heart sounds: Normal heart sounds.  Pulmonary:     Effort: Pulmonary effort is normal.     Breath sounds: Normal breath sounds.  Musculoskeletal:     Right shoulder: She exhibits tenderness.     Left shoulder: She exhibits tenderness.  Skin:    General: Skin is warm.  Neurological:     General: No focal deficit present.     Mental Status: She is alert.  Psychiatric:        Mood and Affect: Mood normal.        Behavior: Behavior normal.         Assessment And Plan:     1. Diabetic mononeuropathy associated with type 2 diabetes mellitus ([place])  We discussed possibility of switching from gabapentin to Lyrica. Today, she was given rx tramadol to use prn.  Review of the [place] CSRS was performed in accordance of the [place] prior to dispensing any controlled drugs. For some reason, she has decreased to 0.'25mg'$  Ozempic. This is likely contributing to her elevated blood sugars. She has been advised to increase Ozempic back to 0.'5mg'$ . After one month, I will increase her to '1mg'$  dosage. All questions were answered to her satisfaction. She verbally agrees to her treatment plan.   - Lipid panel - CMP14+EGFR - Hemoglobin A1c  2. Chronic pain of both shoulders  I will refer her to Ortho for further evaluation. She was also given rx tramadol to use prn. Patient is aware this could cause drowsiness.   3. Need for vaccination  She was given Tdap today. I will also send rx [id number] to the pharmacy.   4. Class 2 severe obesity due to excess calories with serious comorbidity and body mass index (BMI) of 35.0 to 35.9 in adult [facility])  Importance of achieving optimal  weight to decrease risk of cardiovascular disease and cancers was discussed with the patient in full detail. She is encouraged to start slowly - start with 10 minutes twice daily at least three to four days per week and to gradually build to 30 minutes five days weekly. She was given tips to incorporate more activity into her daily routine - take stairs when possible, park farther away from her job, grocery stores, etc.   [name], MD    THE PATIENT IS ENCOURAGED TO PRACTICE SOCIAL DISTANCING  DUE TO THE COVID-19 PANDEMIC.

## 2018-11-12 NOTE — Patient Instructions (Signed)
Please start Ozempic 0.5mg  once weekly on Thursday, June 25th.   After four weeks, increase to 1mg  once weekly. Please contact Dr. Baird Cancer BEFORE you start the next pen.    Diabetes Mellitus and Foot Care Foot care is an important part of your health, especially when you have diabetes. Diabetes may cause you to have problems because of poor blood flow (circulation) to your feet and legs, which can cause your skin to:  Become thinner and drier.  Break more easily.  Heal more slowly.  Peel and crack. You may also have nerve damage (neuropathy) in your legs and feet, causing decreased feeling in them. This means that you may not notice minor injuries to your feet that could lead to more serious problems. Noticing and addressing any potential problems early is the best way to prevent future foot problems. How to care for your feet Foot hygiene  Wash your feet daily with warm water and mild soap. Do not use hot water. Then, pat your feet and the areas between your toes until they are completely dry. Do not soak your feet as this can dry your skin.  Trim your toenails straight across. Do not dig under them or around the cuticle. File the edges of your nails with an emery board or nail file.  Apply a moisturizing lotion or petroleum jelly to the skin on your feet and to dry, brittle toenails. Use lotion that does not contain alcohol and is unscented. Do not apply lotion between your toes. Shoes and socks  Wear clean socks or stockings every day. Make sure they are not too tight. Do not wear knee-high stockings since they may decrease blood flow to your legs.  Wear shoes that fit properly and have enough cushioning. Always look in your shoes before you put them on to be sure there are no objects inside.  To break in new shoes, wear them for just a few hours a day. This prevents injuries on your feet. Wounds, scrapes, corns, and calluses  Check your feet daily for blisters, cuts, bruises,  sores, and redness. If you cannot see the bottom of your feet, use a mirror or ask someone for help.  Do not cut corns or calluses or try to remove them with medicine.  If you find a minor scrape, cut, or break in the skin on your feet, keep it and the skin around it clean and dry. You may clean these areas with mild soap and water. Do not clean the area with peroxide, alcohol, or iodine.  If you have a wound, scrape, corn, or callus on your foot, look at it several times a day to make sure it is healing and not infected. Check for: ? Redness, swelling, or pain. ? Fluid or blood. ? Warmth. ? Pus or a bad smell. General instructions  Do not cross your legs. This may decrease blood flow to your feet.  Do not use heating pads or hot water bottles on your feet. They may burn your skin. If you have lost feeling in your feet or legs, you may not know this is happening until it is too late.  Protect your feet from hot and cold by wearing shoes, such as at the beach or on hot pavement.  Schedule a complete foot exam at least once a year (annually) or more often if you have foot problems. If you have foot problems, report any cuts, sores, or bruises to your health care provider immediately. Contact a health care  provider if:  You have a medical condition that increases your risk of infection and you have any cuts, sores, or bruises on your feet.  You have an injury that is not healing.  You have redness on your legs or feet.  You feel burning or tingling in your legs or feet.  You have pain or cramps in your legs and feet.  Your legs or feet are numb.  Your feet always feel cold.  You have pain around a toenail. Get help right away if:  You have a wound, scrape, corn, or callus on your foot and: ? You have pain, swelling, or redness that gets worse. ? You have fluid or blood coming from the wound, scrape, corn, or callus. ? Your wound, scrape, corn, or callus feels warm to the touch.  ? You have pus or a bad smell coming from the wound, scrape, corn, or callus. ? You have a fever. ? You have a red line going up your leg. Summary  Check your feet every day for cuts, sores, red spots, swelling, and blisters.  Moisturize feet and legs daily.  Wear shoes that fit properly and have enough cushioning.  If you have foot problems, report any cuts, sores, or bruises to your health care provider immediately.  Schedule a complete foot exam at least once a year (annually) or more often if you have foot problems. This information is not intended to replace advice given to you by your health care provider. Make sure you discuss any questions you have with your health care provider. Document Released: 05/04/2000 Document Revised: 06/19/2017 Document Reviewed: 06/08/2016 Elsevier Interactive Patient Education  2019 [employer].

## 2018-11-13 ENCOUNTER — Encounter: Payer: 59 | Admitting: Internal Medicine

## 2018-11-13 LAB — LIPID PANEL
Chol/HDL Ratio: 2.8 ratio (ref 0.0–4.4)
Cholesterol, Total: 102 mg/dL (ref 100–199)
HDL: 36 mg/dL — ABNORMAL LOW (ref >39)
LDL Calculated: 36 mg/dL (ref 0–99)
Triglycerides: 149 mg/dL (ref 0–149)
VLDL Cholesterol Cal: 30 mg/dL (ref 5–40)

## 2018-11-13 LAB — HEMOGLOBIN A1C
Est. average glucose Bld gHb Est-mCnc: 243 mg/dL
Hgb A1c MFr Bld: 10.1 % — ABNORMAL HIGH (ref 4.8–5.6)

## 2018-11-13 LAB — CMP14+EGFR
ALT: 21 IU/L (ref 0–32)
AST: 23 IU/L (ref 0–40)
Albumin/Globulin Ratio: 1.4 (ref 1.2–2.2)
Albumin: 4.2 g/dL (ref 3.8–4.8)
Alkaline Phosphatase: 120 IU/L — ABNORMAL HIGH (ref 39–117)
BUN/Creatinine Ratio: 12 (ref 9–23)
BUN: 7 mg/dL (ref 6–24)
Bilirubin Total: 0.2 mg/dL (ref 0.0–1.2)
CO2: 19 mmol/L — ABNORMAL LOW (ref 20–29)
Calcium: 9.4 mg/dL (ref 8.7–10.2)
Chloride: 103 mmol/L (ref 96–106)
Creatinine, Ser: 0.59 mg/dL (ref 0.57–1.00)
GFR calc Af Amer: 126 mL/min/{1.73_m2} (ref >59)
GFR calc non Af Amer: 109 mL/min/{1.73_m2} (ref >59)
Globulin, Total: 3 g/dL (ref 1.5–4.5)
Glucose: 197 mg/dL — ABNORMAL HIGH (ref 65–99)
Potassium: 4.4 mmol/L (ref 3.5–5.2)
Sodium: 139 mmol/L (ref 134–144)
Total Protein: 7.2 g/dL (ref 6.0–8.5)

## 2018-11-14 MED FILL — OZEMPIC 0.25 OR 0.5 MG/DOSE: 2 | 28 days supply | Qty: 2 | Fill #0

## 2018-11-27 ENCOUNTER — Other Ambulatory Visit: Payer: Self-pay | Admitting: Internal Medicine

## 2018-11-27 MED FILL — GABAPENTIN 600 MG TABLET: 600 | 30 days supply | Qty: 60 | Fill #0

## 2018-11-27 MED FILL — VALACYCLOVIR HCL 500 MG TAB: 500 | 30 days supply | Qty: 36 | Fill #0

## 2018-11-27 MED FILL — FUSION PLUS CAPSULE: 30 days supply | Qty: 30 | Fill #0

## 2018-11-28 MED FILL — FARXIGA 10 MG TABLET: 10 | 90 days supply | Qty: 90 | Fill #0

## 2018-12-02 ENCOUNTER — Telehealth: Payer: Self-pay

## 2018-12-02 NOTE — Telephone Encounter (Signed)
Pa for freestyle libre sent to plan

## 2018-12-04 DIAGNOSIS — S46812A Strain of other muscles, fascia and tendons at shoulder and upper arm level, left arm, initial encounter: Secondary | ICD-10-CM | POA: Diagnosis not present

## 2018-12-04 DIAGNOSIS — S46811A Strain of other muscles, fascia and tendons at shoulder and upper arm level, right arm, initial encounter: Secondary | ICD-10-CM | POA: Diagnosis not present

## 2018-12-04 DIAGNOSIS — M19012 Primary osteoarthritis, left shoulder: Secondary | ICD-10-CM | POA: Diagnosis not present

## 2018-12-04 DIAGNOSIS — M19011 Primary osteoarthritis, right shoulder: Secondary | ICD-10-CM | POA: Diagnosis not present

## 2018-12-04 MED FILL — DICLOFENAC SODIUM 1 % GEL: 1 | 30 days supply | Qty: 500 | Fill #0

## 2018-12-09 DIAGNOSIS — M19011 Primary osteoarthritis, right shoulder: Secondary | ICD-10-CM | POA: Diagnosis not present

## 2018-12-09 DIAGNOSIS — M7542 Impingement syndrome of left shoulder: Secondary | ICD-10-CM | POA: Diagnosis not present

## 2018-12-09 MED FILL — UNIFINE PENTIPS 31GX3/16: 31G X 5 MM | 90 days supply | Qty: 100 | Fill #1

## 2018-12-09 MED FILL — FREESTYLE LIBRE 14 DAY SENS: 28 days supply | Qty: 2 | Fill #0

## 2018-12-10 ENCOUNTER — Telehealth: Payer: Self-pay

## 2018-12-10 DIAGNOSIS — E113292 Type 2 diabetes mellitus with mild nonproliferative diabetic retinopathy without macular edema, left eye: Secondary | ICD-10-CM | POA: Diagnosis not present

## 2018-12-10 NOTE — Telephone Encounter (Signed)
Prior auth approved for [employer] cost to pt is $30 for 30 day supply

## 2018-12-11 ENCOUNTER — Other Ambulatory Visit: Payer: Self-pay | Admitting: Internal Medicine

## 2018-12-11 MED FILL — OZEMPIC 0.25 OR 0.5 MG/DOSE: 2 | 28 days supply | Qty: 2 | Fill #0

## 2018-12-11 MED FILL — TRESIBA FLEXTOUCH 200 UNITS: 200 | 23 days supply | Qty: 9 | Fill #0

## 2018-12-15 ENCOUNTER — Other Ambulatory Visit: Payer: Self-pay | Admitting: Gastroenterology

## 2018-12-15 ENCOUNTER — Other Ambulatory Visit (HOSPITAL_COMMUNITY): Payer: Self-pay | Admitting: Gastroenterology

## 2018-12-15 DIAGNOSIS — R1033 Periumbilical pain: Secondary | ICD-10-CM | POA: Diagnosis not present

## 2018-12-15 DIAGNOSIS — D120 Benign neoplasm of cecum: Secondary | ICD-10-CM | POA: Diagnosis not present

## 2018-12-15 DIAGNOSIS — R194 Change in bowel habit: Secondary | ICD-10-CM | POA: Diagnosis not present

## 2018-12-15 DIAGNOSIS — K6389 Other specified diseases of intestine: Secondary | ICD-10-CM | POA: Diagnosis not present

## 2018-12-15 DIAGNOSIS — K635 Polyp of colon: Secondary | ICD-10-CM | POA: Diagnosis not present

## 2018-12-15 DIAGNOSIS — R933 Abnormal findings on diagnostic imaging of other parts of digestive tract: Secondary | ICD-10-CM

## 2018-12-15 DIAGNOSIS — D509 Iron deficiency anemia, unspecified: Secondary | ICD-10-CM | POA: Diagnosis not present

## 2018-12-15 DIAGNOSIS — Z1211 Encounter for screening for malignant neoplasm of colon: Secondary | ICD-10-CM | POA: Diagnosis not present

## 2018-12-15 LAB — HM COLONOSCOPY

## 2018-12-18 DIAGNOSIS — M67911 Unspecified disorder of synovium and tendon, right shoulder: Secondary | ICD-10-CM | POA: Diagnosis not present

## 2018-12-18 DIAGNOSIS — M19011 Primary osteoarthritis, right shoulder: Secondary | ICD-10-CM | POA: Diagnosis not present

## 2018-12-18 DIAGNOSIS — M7542 Impingement syndrome of left shoulder: Secondary | ICD-10-CM | POA: Diagnosis not present

## 2018-12-22 DIAGNOSIS — M7542 Impingement syndrome of left shoulder: Secondary | ICD-10-CM | POA: Diagnosis not present

## 2018-12-22 DIAGNOSIS — M19011 Primary osteoarthritis, right shoulder: Secondary | ICD-10-CM | POA: Diagnosis not present

## 2018-12-23 ENCOUNTER — Encounter: Payer: Self-pay | Admitting: Internal Medicine

## 2018-12-25 ENCOUNTER — Ambulatory Visit (HOSPITAL_COMMUNITY)
Admission: RE | Admit: 2018-12-25 | Discharge: 2018-12-25 | Disposition: A | Discharge status: Home / Self Care | Payer: 59 | Source: Ambulatory Visit | Attending: Gastroenterology | Admitting: Gastroenterology

## 2018-12-25 ENCOUNTER — Other Ambulatory Visit: Payer: Self-pay

## 2018-12-25 ENCOUNTER — Other Ambulatory Visit: Payer: Self-pay | Admitting: Gastroenterology

## 2018-12-25 DIAGNOSIS — R933 Abnormal findings on diagnostic imaging of other parts of digestive tract: Secondary | ICD-10-CM | POA: Diagnosis not present

## 2018-12-25 DIAGNOSIS — K760 Fatty (change of) liver, not elsewhere classified: Secondary | ICD-10-CM | POA: Diagnosis not present

## 2018-12-25 LAB — POCT I-STAT CREATININE: Creatinine, Ser: 0.6 mg/dL (ref 0.44–1.00)

## 2018-12-25 MED ORDER — IOHEXOL 300 MG/ML  SOLN
100.0000 mL | Freq: Once | INTRAMUSCULAR | Status: AC | PRN
  Administered 2018-12-25: 16:00:00 100 mL via INTRAVENOUS

## 2018-12-26 ENCOUNTER — Encounter: Payer: Self-pay | Admitting: Internal Medicine

## 2018-12-26 MED FILL — GABAPENTIN 600 MG TABLET: 600 | 30 days supply | Qty: 60 | Fill #1

## 2018-12-30 DIAGNOSIS — M7542 Impingement syndrome of left shoulder: Secondary | ICD-10-CM | POA: Diagnosis not present

## 2018-12-30 DIAGNOSIS — M19011 Primary osteoarthritis, right shoulder: Secondary | ICD-10-CM | POA: Diagnosis not present

## 2019-01-06 DIAGNOSIS — M7542 Impingement syndrome of left shoulder: Secondary | ICD-10-CM | POA: Diagnosis not present

## 2019-01-06 DIAGNOSIS — M19011 Primary osteoarthritis, right shoulder: Secondary | ICD-10-CM | POA: Diagnosis not present

## 2019-01-07 ENCOUNTER — Encounter: Payer: Self-pay | Admitting: Internal Medicine

## 2019-01-07 ENCOUNTER — Other Ambulatory Visit: Payer: Self-pay

## 2019-01-07 ENCOUNTER — Ambulatory Visit: Payer: 59 | Admitting: Internal Medicine

## 2019-01-07 VITALS — BP 116/88 | HR 94 | Temp 98.5°F | Ht 60.8 in | Wt 194.4 lb

## 2019-01-07 DIAGNOSIS — E1141 Type 2 diabetes mellitus with diabetic mononeuropathy: Secondary | ICD-10-CM

## 2019-01-07 DIAGNOSIS — Z6836 Body mass index (BMI) 36.0-36.9, adult: Secondary | ICD-10-CM

## 2019-01-07 DIAGNOSIS — Z23 Encounter for immunization: Secondary | ICD-10-CM | POA: Diagnosis not present

## 2019-01-07 DIAGNOSIS — Z794 Long term (current) use of insulin: Secondary | ICD-10-CM

## 2019-01-07 DIAGNOSIS — E119 Type 2 diabetes mellitus without complications: Secondary | ICD-10-CM

## 2019-01-07 MED ORDER — PREGABALIN 75 MG PO CAPS: 75.0000 mg | ORAL_CAPSULE | Freq: Two times a day (BID) | ORAL | 2 refills | Status: DC

## 2019-01-07 MED ORDER — FREESTYLE LIBRE 14 DAY SENSOR MISC: 3.0000 | Freq: Four times a day (QID) | 6 refills | Status: DC

## 2019-01-07 MED FILL — PREGABALIN 75 MG CAPS: 75 | 30 days supply | Qty: 60 | Fill #0

## 2019-01-07 MED FILL — FREESTYLE LIBRE 14 DAY SENS: 28 days supply | Qty: 2 | Fill #0

## 2019-01-07 NOTE — Patient Instructions (Signed)
Neuropathic Pain Neuropathic pain is pain caused by damage to the nerves that are responsible for certain sensations in your body (sensory nerves). The pain can be caused by:  Damage to the sensory nerves that send signals to your spinal cord and brain (peripheral nervous system).  Damage to the sensory nerves in your brain or spinal cord (central nervous system). Neuropathic pain can make you more sensitive to pain. Even a minor sensation can feel very painful. This is usually a long-term condition that can be difficult to treat. The type of pain differs from person to person. It may:  Start suddenly (acute), or it may develop slowly and last for a long time (chronic).  Come and go as damaged nerves heal, or it may stay at the same level for years.  Cause emotional distress, loss of sleep, and a lower quality of life. What are the causes? The most common cause of this condition is diabetes. Many other diseases and conditions can also cause neuropathic pain. Causes of neuropathic pain can be classified as:  Toxic. This is caused by medicines and chemicals. The most common cause of toxic neuropathic pain is damage from cancer treatments (chemotherapy).  Metabolic. This can be caused by: ? Diabetes. This is the most common disease that damages the nerves. ? Lack of vitamin B from long-term alcohol abuse.  Traumatic. Any injury that cuts, crushes, or stretches a nerve can cause damage and pain. A common example is feeling pain after losing an arm or leg (phantom limb pain).  Compression-related. If a sensory nerve gets trapped or compressed for a long period of time, the blood supply to the nerve can be cut off.  Vascular. Many blood vessel diseases can cause neuropathic pain by decreasing blood supply and oxygen to nerves.  Autoimmune. This type of pain results from diseases in which the body's defense system (immune system) mistakenly attacks sensory nerves. Examples of autoimmune diseases  that can cause neuropathic pain include lupus and multiple sclerosis.  Infectious. Many types of viral infections can damage sensory nerves and cause pain. Shingles infection is a common cause of this type of pain.  Inherited. Neuropathic pain can be a symptom of many diseases that are passed down through families (genetic). What increases the risk? You are more likely to develop this condition if:  You have diabetes.  You smoke.  You drink too much alcohol.  You are taking certain medicines, including medicines that kill cancer cells (chemotherapy) or that treat immune system disorders. What are the signs or symptoms? The main symptom is pain. Neuropathic pain is often described as:  Burning.  Shock-like.  Stinging.  Hot or cold.  Itching. How is this diagnosed? No single test can diagnose neuropathic pain. It is diagnosed based on:  Physical exam and your symptoms. Your health care provider will ask you about your pain. You may be asked to use a pain scale to describe how bad your pain is.  Tests. These may be done to see if you have a high sensitivity to pain and to help find the cause and location of any sensory nerve damage. They include: ? Nerve conduction studies to test how well nerve signals travel through your sensory nerves (electrodiagnostic testing). ? Stimulating your sensory nerves through electrodes on your skin and measuring the response in your spinal cord and brain (somatosensory evoked potential).  Imaging studies, such as: ? X-rays. ? CT scan. ? MRI. How is this treated? Treatment for neuropathic pain may change   over time. You may need to try different treatment options or a combination of treatments. Some options include:  Treating the underlying cause of the neuropathy, such as diabetes, kidney disease, or vitamin deficiencies.  Stopping medicines that can cause neuropathy, such as chemotherapy.  Medicine to relieve pain. Medicines may  include: ? Prescription or over-the-counter pain medicine. ? Anti-seizure medicine. ? Antidepressant medicines. ? Pain-relieving patches that are applied to painful areas of skin. ? A medicine to numb the area (local anesthetic), which can be injected as a nerve block.  Transcutaneous nerve stimulation. This uses electrical currents to block painful nerve signals. The treatment is painless.  Alternative treatments, such as: ? Acupuncture. ? Meditation. ? Massage. ? Physical therapy. ? Pain management programs. ? Counseling. Follow these instructions at home: Medicines   Take over-the-counter and prescription medicines only as told by your health care provider.  Do not drive or use heavy machinery while taking prescription pain medicine.  If you are taking prescription pain medicine, take actions to prevent or treat constipation. Your health care provider may recommend that you: ? Drink enough fluid to keep your urine pale yellow. ? Eat foods that are high in fiber, such as fresh fruits and vegetables, whole grains, and beans. ? Limit foods that are high in fat and processed sugars, such as fried or sweet foods. ? Take an over-the-counter or prescription medicine for constipation. Lifestyle   Have a good support system at home.  Consider joining a chronic pain support group.  Do not use any products that contain nicotine or tobacco, such as cigarettes and e-cigarettes. If you need help quitting, ask your health care provider.  Do not drink alcohol. General instructions  Learn as much as you can about your condition.  Work closely with all your health care providers to find the treatment plan that works best for you.  Ask your health care provider what activities are safe for you.  Keep all follow-up visits as told by your health care provider. This is important. Contact a health care provider if:  Your pain treatments are not working.  You are having side effects  from your medicines.  You are struggling with tiredness (fatigue), mood changes, depression, or anxiety. Summary  Neuropathic pain is pain caused by damage to the nerves that are responsible for certain sensations in your body (sensory nerves).  Neuropathic pain may come and go as damaged nerves heal, or it may stay at the same level for years.  Neuropathic pain is usually a long-term condition that can be difficult to treat. Consider joining a chronic pain support group. This information is not intended to replace advice given to you by your health care provider. Make sure you discuss any questions you have with your health care provider. Document Released: 02/02/2004 Document Revised: 08/28/2018 Document Reviewed: 05/24/2017 Elsevier Patient Education  2020 [employer] Inc[employer]  

## 2019-01-08 DIAGNOSIS — M7542 Impingement syndrome of left shoulder: Secondary | ICD-10-CM | POA: Diagnosis not present

## 2019-01-08 DIAGNOSIS — M19011 Primary osteoarthritis, right shoulder: Secondary | ICD-10-CM | POA: Diagnosis not present

## 2019-01-08 LAB — HEMOGLOBIN A1C
Est. average glucose Bld gHb Est-mCnc: 240 mg/dL
Hgb A1c MFr Bld: 10 % — ABNORMAL HIGH (ref 4.8–5.6)

## 2019-01-11 NOTE — Progress Notes (Signed)
Subjective:     Patient ID: Michelle Tanner , female    DOB: [birth year] , 48 y.o.   MRN: [id number]   Chief Complaint  Patient presents with  . Diabetes    HPI  Diabetes She presents for her follow-up diabetic visit. She has type 2 diabetes mellitus. There are no hypoglycemic associated symptoms. Associated symptoms include foot paresthesias. There are no hypoglycemic complications. Risk factors for coronary artery disease include diabetes mellitus, sedentary lifestyle, stress and obesity. Current diabetic treatment includes insulin injections. She is compliant with treatment most of the time.     Past Medical History:  Diagnosis Date  . Diabetes mellitus   . Frozen shoulder    left  . Hx of migraine headaches   . Migraines   . Neuromuscular disorder ([place])    diabetic neuropathy     Family History  Problem Relation Age of Onset  . Rectal cancer Paternal Grandmother   . Diabetes Father   . Kidney disease Father   . CAD Father   . Cancer Mother   . Cancer Paternal Uncle   . Cancer Paternal Aunt      Current Outpatient Medications:  .  Cholecalciferol (VITAMIN D PO), Take by mouth., Disp: , Rfl:  .  Continuous Blood Gluc Sensor (FREESTYLE LIBRE 14 DAY SENSOR) MISC, 3 Devices by Does not apply route QID., Disp: 3 each, Rfl: 6 .  FARXIGA 10 MG TABS tablet, TAKE 1 TABLET BY MOUTH DAILY, Disp: 90 tablet, Rfl: 2 .  folic acid (FOLVITE) 1 MG tablet, TAKE 1 TABLET (1 MG TOTAL) BY MOUTH DAILY., Disp: 30 tablet, Rfl: 0 .  gabapentin (NEURONTIN) 600 MG tablet, TAKE 1 TABLET BY MOUTH TWICE DAILY, Disp: 60 tablet, Rfl: 1 .  ibuprofen (ADVIL,MOTRIN) 800 MG tablet, Take 800 mg by mouth every 8 (eight) hours as needed. For pain, Disp: , Rfl:  .  Iron-FA-B Cmp-C-Biot-Probiotic (FUSION PLUS) CAPS, Take by mouth. 1 capsule daily, Disp: , Rfl:  .  metFORMIN (GLUCOPHAGE) 1000 MG tablet, TAKE 1 TABLET BY MOUTH TWO TIMES A DAY WITH MORNING AND EVENING MEALS, Disp: 180 tablet, Rfl: 1 .   Multiple Vitamin (MULITIVITAMIN WITH MINERALS) TABS, Take 1 tablet by mouth daily., Disp: , Rfl:  .  nystatin-triamcinolone (MYCOLOG II) cream, Apply topically 4 (four) times daily., Disp: 30 g, Rfl: 0 .  OZEMPIC, 0.25 OR 0.5 MG/DOSE, 2 MG/1.5ML SOPN, INJECT 0.5 MG UNDER THE SKIN ON THE SAME DAY OF EACH WEEK, IN THE ABDOMEN, THIGHS, OR UPPER ARM ROTATING INJECTION SITES, Disp: 1.5 mL, Rfl: 1 .  SUMAtriptan (IMITREX) 50 MG tablet, Take 1 tablet (50 mg total) by mouth every 2 (two) hours as needed., Disp: 10 tablet, Rfl: 2 .  traMADol (ULTRAM) 50 MG tablet, Take 1 tablet (50 mg total) by mouth every 6 (six) hours as needed., Disp: 30 tablet, Rfl: 0 .  TRESIBA FLEXTOUCH 200 UNIT/ML SOPN, INJECT 30 UNITS UNDER THE SKIN AT BEDTIME (MAX DOSE IS 80 UNITS), Disp: 9 mL, Rfl: 1 .  UNIFINE PENTIPS 31G X 5 MM MISC, USE AS DIRECTED WITH VICTOZA PEN, Disp: 100 each, Rfl: 4 .  valACYclovir (VALTREX) 500 MG tablet, Take 500 mg by mouth 3 (three) times daily., Disp: , Rfl: 0 .  pregabalin (LYRICA) 75 MG capsule, Take 1 capsule (75 mg total) by mouth 2 (two) times daily., Disp: 60 capsule, Rfl: 2   Allergies  Allergen Reactions  . Latex Hives     Review of Systems  Constitutional: Negative.   Respiratory: Negative.   Cardiovascular: Negative.   Gastrointestinal: Negative.   Neurological: Positive for numbness.       Feels her neuropathy has worsened. Wants to try new treatment.   Psychiatric/Behavioral: Negative.      Today's Vitals   01/07/19 1527  BP: 116/88  Pulse: 94  Temp: 98.5 F (36.9 C)  TempSrc: Oral  Weight: 194 lb 6.4 oz (88.2 kg)  Height: 5' 0.8" (1.544 m)   Body mass index is 36.97 kg/m.   Objective:  Physical Exam Vitals signs and nursing note reviewed.  Constitutional:      Appearance: Normal appearance.  HENT:     Head: Normocephalic and atraumatic.  Cardiovascular:     Rate and Rhythm: Normal rate and regular rhythm.     Heart sounds: Normal heart sounds.  Pulmonary:      Effort: Pulmonary effort is normal.     Breath sounds: Normal breath sounds.  Skin:    General: Skin is warm.  Neurological:     General: No focal deficit present.     Mental Status: She is alert.  Psychiatric:        Mood and Affect: Mood normal.        Behavior: Behavior normal.         Assessment And Plan:     1. Diabetic mononeuropathy associated with type 2 diabetes mellitus (HCC)  Chronic. Again, I stressed the importance of dietary and medication compliance. I will check hba1c today. I will make further recommendations once her labs are available at this time. I will d/c gabapentin. I will switch her to Lyrica, 75mg  nightly. She is encouraged to let me know how she is doing in a week or so. At that time, I will increase medication if this does not make her sleepy.   - Hemoglobin A1c  2. Class 2 severe obesity due to excess calories with serious comorbidity and body mass index (BMI) of 36.0 to 36.9 in adult [facility])  Chronic. She is encouraged to strive for BMI less than 30 to decrease cardiac risk. She is encouraged to incorporate more exercise into her daily routine. She is advised to start exercising on the days she is off first. She agrees to build from there.   3. Need for vaccination  She was given pneumovax-23 to update her immunization history.   [name], MD    THE PATIENT IS ENCOURAGED TO PRACTICE SOCIAL DISTANCING DUE TO THE COVID-19 PANDEMIC.

## 2019-01-13 ENCOUNTER — Telehealth: Payer: Self-pay

## 2019-01-13 NOTE — Telephone Encounter (Signed)
I left the pt a message that I was calling with her lab results because she hasn't responded to Dr. Baird Cancer' MyChart message, that she can call the office back or respond to the MyChart message.

## 2019-01-13 NOTE — Telephone Encounter (Signed)
-----   Message from Glendale Chard, MD sent at 01/10/2019 12:48 PM EDT ----- Your hba1c is 10.0, our goal is less than 8.0. We must reach this goal before the end of the year. If you do not have appt in 2 months, please call to schedule. Please remove sugary beverages from your diet. Please email me your FASTING sugars every Monday so I can adjust your insulin accordingly. Be sure to take metformin and [place] daily.   Please let me know if you have any questions.   Take care and stay safe,   RS

## 2019-01-14 ENCOUNTER — Telehealth: Payer: Self-pay

## 2019-01-14 NOTE — Telephone Encounter (Signed)
-----   Message from Glendale Chard, MD sent at 01/10/2019 12:48 PM EDT ----- Your hba1c is 10.0, our goal is less than 8.0. We must reach this goal before the end of the year. If you do not have appt in 2 months, please call to schedule. Please remove sugary beverages from your diet. Please email me your FASTING sugars every Monday so I can adjust your insulin accordingly. Be sure to take metformin and [place] daily.   SPOKE WITH PATIENT, PATIENT AGREED TO REACHING GOAL BY THE END OF THE YEAR, PATIENT STATED SHE WILL CALL THE OFFICE TOMORROW 01/15/2019, PT ALSO AGREED TO REMOVE SUGARY BEVERAGES FROM DIET, AND TO ALSO EMAIL ALL FASTING SUGARS EVERY Monday. REMINDER TO TAKE METFORMIN, AND FARXIGA DAILY WAS ALSO GIVEN

## 2019-01-15 DIAGNOSIS — M19011 Primary osteoarthritis, right shoulder: Secondary | ICD-10-CM | POA: Diagnosis not present

## 2019-01-15 DIAGNOSIS — M7542 Impingement syndrome of left shoulder: Secondary | ICD-10-CM | POA: Diagnosis not present

## 2019-01-29 ENCOUNTER — Other Ambulatory Visit: Payer: Self-pay | Admitting: Family

## 2019-01-29 DIAGNOSIS — D560 Alpha thalassemia: Secondary | ICD-10-CM

## 2019-01-29 MED FILL — FREESTYLE LIBRE 14 DAY SENS: 28 days supply | Qty: 2 | Fill #0

## 2019-01-29 MED FILL — metFORMIN HCL 1000 MG TABS: 1000 | 90 days supply | Qty: 180 | Fill #0

## 2019-01-29 MED FILL — PREGABALIN 75 MG CAPS: 75 | 30 days supply | Qty: 60 | Fill #0

## 2019-01-29 MED FILL — FUSION PLUS CAPSULE: 30 days supply | Qty: 30 | Fill #1

## 2019-01-29 MED FILL — OZEMPIC 0.25 OR 0.5 MG/DOSE: 2 | 28 days supply | Qty: 2 | Fill #1

## 2019-01-30 MED FILL — FOLIC ACID 1 MG TABS: 1 | 30 days supply | Qty: 30 | Fill #0

## 2019-02-05 ENCOUNTER — Other Ambulatory Visit: Payer: Self-pay

## 2019-02-05 ENCOUNTER — Inpatient Hospital Stay: Payer: 59 | Attending: Hematology & Oncology

## 2019-02-05 ENCOUNTER — Inpatient Hospital Stay (HOSPITAL_BASED_OUTPATIENT_CLINIC_OR_DEPARTMENT_OTHER): Payer: 59 | Admitting: Hematology & Oncology

## 2019-02-05 ENCOUNTER — Encounter: Payer: Self-pay | Admitting: Hematology & Oncology

## 2019-02-05 VITALS — BP 134/89 | HR 86 | Temp 98.7°F | Resp 16 | Wt 189.0 lb

## 2019-02-05 DIAGNOSIS — D500 Iron deficiency anemia secondary to blood loss (chronic): Secondary | ICD-10-CM

## 2019-02-05 DIAGNOSIS — D509 Iron deficiency anemia, unspecified: Secondary | ICD-10-CM | POA: Insufficient documentation

## 2019-02-05 DIAGNOSIS — Z79899 Other long term (current) drug therapy: Secondary | ICD-10-CM | POA: Diagnosis not present

## 2019-02-05 DIAGNOSIS — D560 Alpha thalassemia: Secondary | ICD-10-CM | POA: Insufficient documentation

## 2019-02-05 LAB — CBC WITH DIFFERENTIAL (CANCER CENTER ONLY)
Abs Immature Granulocytes: 0.08 10*3/uL — ABNORMAL HIGH (ref 0.00–0.07)
Basophils Absolute: 0.1 10*3/uL (ref 0.0–0.1)
Basophils Relative: 1 %
Eosinophils Absolute: 0.3 10*3/uL (ref 0.0–0.5)
Eosinophils Relative: 2 %
HCT: 42.5 % (ref 36.0–46.0)
Hemoglobin: 12.8 g/dL (ref 12.0–15.0)
Immature Granulocytes: 1 %
Lymphocytes Relative: 13 %
Lymphs Abs: 2.1 10*3/uL (ref 0.7–4.0)
MCH: 25.7 pg — ABNORMAL LOW (ref 26.0–34.0)
MCHC: 30.1 g/dL (ref 30.0–36.0)
MCV: 85.2 fL (ref 80.0–100.0)
Monocytes Absolute: 0.9 10*3/uL (ref 0.1–1.0)
Monocytes Relative: 5 %
Neutro Abs: 13.4 10*3/uL — ABNORMAL HIGH (ref 1.7–7.7)
Neutrophils Relative %: 78 %
Platelet Count: 420 10*3/uL — ABNORMAL HIGH (ref 150–400)
RBC: 4.99 MIL/uL (ref 3.87–5.11)
RDW: 13.7 % (ref 11.5–15.5)
WBC Count: 16.9 10*3/uL — ABNORMAL HIGH (ref 4.0–10.5)
nRBC: 0 % (ref 0.0–0.2)

## 2019-02-05 LAB — RETICULOCYTES
Immature Retic Fract: 8.5 % (ref 2.3–15.9)
RBC.: 4.97 MIL/uL (ref 3.87–5.11)
Retic Count, Absolute: 70.6 10*3/uL (ref 19.0–186.0)
Retic Ct Pct: 1.4 % (ref 0.4–3.1)

## 2019-02-05 NOTE — Progress Notes (Signed)
Hematology and Oncology Follow Up Visit  Michelle Tanner Aug 03, 1970 48 y.o. 02/05/2019   Principle Diagnosis:  Iron deficiency anemia Alpha thalassemia  Current Therapy:   IV iron as indicated -- feraheme given [id number] Folic acid 1 mg PO daily   Interim History:  Michelle Tanner is here today for follow-up.  Michelle Tanner does feel a little tired.  Michelle Tanner is getting ready to work Michelle Tanner 3-day shift over at [employer].  Michelle Tanner works 12-hour shifts on the weekend.  Michelle Tanner is incredibly busy because of the coronavirus.  Michelle Tanner iron studies that were done back in March showed a ferritin of 249 with an iron saturation of 19%.  Michelle Tanner thinks that Michelle Tanner iron is going below that Michelle Tanner will need some iron.  Michelle Tanner had been off folic acid.  For some reason a doctor took Michelle Tanner off folic acid.  Michelle Tanner however is now back on folic acid.  Michelle Tanner started this about a week ago.  There is been no bleeding.  Michelle Tanner has had no change in bowel or bladder habits.  Michelle Tanner has had no nausea or vomiting.  Michelle Tanner says Michelle Tanner mammograms are up-to-date.    Overall, Michelle Tanner performance status is ECOG 1.  Medications:  Allergies as of 02/05/2019      Reactions   Latex Hives      Medication List       Accurate as of February 05, 2019  3:27 PM. If you have any questions, ask your nurse or doctor.        STOP taking these medications   gabapentin 600 MG tablet Commonly known as: NEURONTIN Stopped by: Volanda Napoleon, MD     TAKE these medications   Farxiga 10 MG Tabs tablet Generic drug: dapagliflozin propanediol TAKE 1 TABLET BY MOUTH DAILY   folic acid 1 MG tablet Commonly known as: FOLVITE TAKE 1 TABLET BY MOUTH ONCE DAILY   FreeStyle Libre 14 Day Sensor Misc 3 Devices by Does not apply route QID.   Fusion Plus Caps Take by mouth. 1 capsule daily   ibuprofen 800 MG tablet Commonly known as: ADVIL Take 800 mg by mouth every 8 (eight) hours as needed. For pain   metFORMIN 1000 MG tablet Commonly known as: GLUCOPHAGE TAKE 1  TABLET BY MOUTH TWO TIMES A DAY WITH MORNING AND EVENING MEALS   multivitamin with minerals Tabs tablet Take 1 tablet by mouth daily.   nystatin-triamcinolone cream Commonly known as: MYCOLOG II Apply topically 4 (four) times daily.   Ozempic (0.25 or 0.5 MG/DOSE) 2 MG/1.5ML Sopn Generic drug: Semaglutide(0.25 or 0.5MG /DOS) INJECT 0.5 MG UNDER THE SKIN ON THE SAME DAY OF EACH WEEK, IN THE ABDOMEN, THIGHS, OR UPPER ARM ROTATING INJECTION SITES   pregabalin 75 MG capsule Commonly known as: Lyrica Take 1 capsule (75 mg total) by mouth 2 (two) times daily.   SUMAtriptan 50 MG tablet Commonly known as: IMITREX Take 1 tablet (50 mg total) by mouth every 2 (two) hours as needed.   traMADol 50 MG tablet Commonly known as: Ultram Take 1 tablet (50 mg total) by mouth every 6 (six) hours as needed.   Tyler Aas FlexTouch 200 UNIT/ML Sopn Generic drug: Insulin Degludec INJECT 30 UNITS UNDER THE SKIN AT BEDTIME (MAX DOSE IS 80 UNITS)   Unifine Pentips 31G X 5 MM Misc Generic drug: Insulin Pen Needle USE AS DIRECTED WITH VICTOZA PEN   valACYclovir 500 MG tablet Commonly known as: VALTREX Take 500 mg by mouth 3 (three) times daily.  VITAMIN D PO Take by mouth.       Allergies:  Allergies  Allergen Reactions  . Latex Hives    Past Medical History, Surgical history, Social history, and Family History were reviewed and updated.  Review of Systems: Review of Systems  Constitutional: Negative.   HENT: Negative.   Eyes: Negative.   Respiratory: Negative.   Cardiovascular: Negative.   Gastrointestinal: Negative.   Genitourinary: Negative.   Musculoskeletal: Negative.   Skin: Negative.   Neurological: Negative.   Endo/Heme/Allergies: Negative.   Psychiatric/Behavioral: Negative.      Physical Exam:  weight is 189 lb (85.7 kg). Michelle Tanner temporal temperature is 98.7 F (37.1 C). Michelle Tanner blood pressure is 134/89 and Michelle Tanner pulse is 86. Michelle Tanner respiration is 16 and oxygen saturation is 98%.    Wt Readings from Last 3 Encounters:  02/05/19 189 lb (85.7 kg)  01/07/19 194 lb 6.4 oz (88.2 kg)  11/12/18 189 lb (85.7 kg)    Physical Exam Vitals signs reviewed.  HENT:     Head: Normocephalic and atraumatic.  Eyes:     Pupils: Pupils are equal, round, and reactive to light.  Neck:     Musculoskeletal: Normal range of motion.  Cardiovascular:     Rate and Rhythm: Normal rate and regular rhythm.     Heart sounds: Normal heart sounds.  Pulmonary:     Effort: Pulmonary effort is normal.     Breath sounds: Normal breath sounds.  Abdominal:     General: Bowel sounds are normal.     Palpations: Abdomen is soft.  Musculoskeletal: Normal range of motion.        General: No tenderness or deformity.  Lymphadenopathy:     Cervical: No cervical adenopathy.  Skin:    General: Skin is warm and dry.     Findings: No erythema or rash.  Neurological:     Mental Status: Michelle Tanner is alert and oriented to person, place, and time.  Psychiatric:        Behavior: Behavior normal.        Thought Content: Thought content normal.        Judgment: Judgment normal.      Lab Results  Component Value Date   WBC 16.9 (H) 02/05/2019   HGB 12.8 02/05/2019   HCT 42.5 02/05/2019   MCV 85.2 02/05/2019   PLT 420 (H) 02/05/2019   Lab Results  Component Value Date   FERRITIN 249 08/04/2018   IRON 46 08/04/2018   TIBC 248 08/04/2018   UIBC 202 08/04/2018   IRONPCTSAT 19 (L) 08/04/2018   Lab Results  Component Value Date   RETICCTPCT 1.4 02/05/2019   RBC 4.97 02/05/2019   RBC 4.99 02/05/2019   No results found for: KPAFRELGTCHN, LAMBDASER, KAPLAMBRATIO No results found for: IGGSERUM, IGA, IGMSERUM No results found for: Odetta Pink, SPEI   Chemistry      Component Value Date/Time   NA 139 11/12/2018 1159   K 4.4 11/12/2018 1159   CL 103 11/12/2018 1159   CO2 19 (L) 11/12/2018 1159   BUN 7 11/12/2018 1159   CREATININE 0.60  12/25/2018 1529   CREATININE 0.63 08/04/2018 1405   GLU 78 08/29/2017      Component Value Date/Time   CALCIUM 9.4 11/12/2018 1159   ALKPHOS 120 (H) 11/12/2018 1159   AST 23 11/12/2018 1159   AST 20 08/04/2018 1405   ALT 21 11/12/2018 1159   ALT 19 08/04/2018 1405  BILITOT 0.2 11/12/2018 1159   BILITOT 0.2 (L) 08/04/2018 1405       Impression and Plan: Michelle Tanner is a very pleasant 49 yo female with iron deficiency anemia.  We will see what Michelle Tanner iron studies have to show.  I would think that they might be on the low side.  Michelle Tanner will definitely be willing to come in for IV iron if Michelle Tanner iron is on the low side.  I will probably plan to get Michelle Tanner back before the holidays just to make sure that Michelle Tanner iron levels are okay so Michelle Tanner will enjoy the holidays and not be tired.     Volanda Napoleon, MD 9/17/20203:27 PM

## 2019-02-06 ENCOUNTER — Telehealth: Payer: Self-pay | Admitting: Hematology & Oncology

## 2019-02-06 LAB — IRON AND TIBC
Iron: 23 ug/dL — ABNORMAL LOW (ref 41–142)
Saturation Ratios: 9 % — ABNORMAL LOW (ref 21–57)
TIBC: 265 ug/dL (ref 236–444)
UIBC: 242 ug/dL (ref 120–384)

## 2019-02-06 LAB — FERRITIN: Ferritin: 271 ng/mL (ref 11–307)

## 2019-02-06 NOTE — Telephone Encounter (Signed)
lmom to inform patient of Nov appts per 9/17 LOS

## 2019-02-09 ENCOUNTER — Telehealth: Payer: Self-pay | Admitting: Family

## 2019-02-09 ENCOUNTER — Other Ambulatory Visit: Payer: Self-pay | Admitting: Gastroenterology

## 2019-02-09 NOTE — Telephone Encounter (Signed)
Spoke with patient to confirm iron appts per 9/18 result note

## 2019-02-12 ENCOUNTER — Inpatient Hospital Stay: Payer: 59

## 2019-02-12 ENCOUNTER — Other Ambulatory Visit: Payer: Self-pay

## 2019-02-12 VITALS — BP 139/82 | HR 83 | Temp 97.8°F | Resp 17

## 2019-02-12 DIAGNOSIS — D509 Iron deficiency anemia, unspecified: Secondary | ICD-10-CM | POA: Diagnosis not present

## 2019-02-12 DIAGNOSIS — D500 Iron deficiency anemia secondary to blood loss (chronic): Secondary | ICD-10-CM

## 2019-02-12 DIAGNOSIS — D560 Alpha thalassemia: Secondary | ICD-10-CM | POA: Diagnosis not present

## 2019-02-12 DIAGNOSIS — Z79899 Other long term (current) drug therapy: Secondary | ICD-10-CM | POA: Diagnosis not present

## 2019-02-12 MED ORDER — SODIUM CHLORIDE 0.9 % IV SOLN
510.0000 mg | Freq: Once | INTRAVENOUS | Status: AC
  Administered 2019-02-12: 15:00:00 510 mg via INTRAVENOUS
  Filled 2019-02-12: qty 510

## 2019-02-12 MED ORDER — SODIUM CHLORIDE 0.9 % IV SOLN
INTRAVENOUS | Status: DC
  Administered 2019-02-12: 15:00:00 via INTRAVENOUS
  Filled 2019-02-12: qty 250

## 2019-02-19 ENCOUNTER — Other Ambulatory Visit: Payer: Self-pay

## 2019-02-19 ENCOUNTER — Ambulatory Visit: Payer: 59 | Admitting: Internal Medicine

## 2019-02-19 ENCOUNTER — Encounter: Payer: Self-pay | Admitting: Internal Medicine

## 2019-02-19 ENCOUNTER — Inpatient Hospital Stay: Payer: 59 | Attending: Hematology & Oncology

## 2019-02-19 VITALS — BP 126/72 | HR 82 | Temp 97.7°F | Resp 17

## 2019-02-19 VITALS — BP 122/68 | HR 69 | Temp 98.6°F | Ht 60.8 in | Wt 188.8 lb

## 2019-02-19 DIAGNOSIS — E1141 Type 2 diabetes mellitus with diabetic mononeuropathy: Secondary | ICD-10-CM

## 2019-02-19 DIAGNOSIS — D500 Iron deficiency anemia secondary to blood loss (chronic): Secondary | ICD-10-CM

## 2019-02-19 DIAGNOSIS — D509 Iron deficiency anemia, unspecified: Secondary | ICD-10-CM | POA: Insufficient documentation

## 2019-02-19 DIAGNOSIS — Z6835 Body mass index (BMI) 35.0-35.9, adult: Secondary | ICD-10-CM | POA: Diagnosis not present

## 2019-02-19 MED ORDER — PREGABALIN 100 MG PO CAPS: 100.0000 mg | ORAL_CAPSULE | Freq: Two times a day (BID) | ORAL | 2 refills | Status: DC

## 2019-02-19 MED ORDER — SODIUM CHLORIDE 0.9 % IV SOLN
510.0000 mg | Freq: Once | INTRAVENOUS | Status: AC
  Administered 2019-02-19: 510 mg via INTRAVENOUS
  Filled 2019-02-19: qty 17

## 2019-02-19 MED ORDER — SODIUM CHLORIDE 0.9 % IV SOLN
Freq: Once | INTRAVENOUS | Status: AC
  Administered 2019-02-19: 09:00:00 via INTRAVENOUS
  Filled 2019-02-19: qty 250

## 2019-02-19 NOTE — Patient Instructions (Signed)

## 2019-02-20 MED FILL — PREGABALIN 100 MG CAPS: 100 | 30 days supply | Qty: 60 | Fill #0

## 2019-02-22 NOTE — Progress Notes (Signed)
Subjective:     Patient ID: Michelle Tanner , female    DOB: 06/30/1970 , 48 y.o.   MRN: [id number]   Chief Complaint  Patient presents with  . Follow-up    LYRICA    HPI  She is here today for f/u Lyrica. She was started on this at her last visit to address diabetic neuropathy.  She reports an improvement in her sx. She has not experienced any side effects from the medication.     Past Medical History:  Diagnosis Date  . Diabetes mellitus   . Frozen shoulder    left  . Hx of migraine headaches   . Migraines   . Neuromuscular disorder ([place])    diabetic neuropathy     Family History  Problem Relation Age of Onset  . Rectal cancer Paternal Grandmother   . Diabetes Father   . Kidney disease Father   . CAD Father   . Cancer Mother   . Cancer Paternal Uncle   . Cancer Paternal Aunt      Current Outpatient Medications:  .  Cholecalciferol (VITAMIN D PO), Take by mouth., Disp: , Rfl:  .  Continuous Blood Gluc Sensor (FREESTYLE LIBRE 14 DAY SENSOR) MISC, 3 Devices by Does not apply route QID., Disp: 3 each, Rfl: 6 .  FARXIGA 10 MG TABS tablet, TAKE 1 TABLET BY MOUTH DAILY, Disp: 90 tablet, Rfl: 2 .  folic acid (FOLVITE) 1 MG tablet, TAKE 1 TABLET BY MOUTH ONCE DAILY, Disp: 30 tablet, Rfl: 0 .  ibuprofen (ADVIL,MOTRIN) 800 MG tablet, Take 800 mg by mouth every 8 (eight) hours as needed. For pain, Disp: , Rfl:  .  Iron-FA-B Cmp-C-Biot-Probiotic (FUSION PLUS) CAPS, Take by mouth. 1 capsule daily, Disp: , Rfl:  .  metFORMIN (GLUCOPHAGE) 1000 MG tablet, TAKE 1 TABLET BY MOUTH TWO TIMES A DAY WITH MORNING AND EVENING MEALS, Disp: 180 tablet, Rfl: 1 .  Multiple Vitamin (MULITIVITAMIN WITH MINERALS) TABS, Take 1 tablet by mouth daily., Disp: , Rfl:  .  nystatin-triamcinolone (MYCOLOG II) cream, Apply topically 4 (four) times daily., Disp: 30 g, Rfl: 0 .  OZEMPIC, 0.25 OR 0.5 MG/DOSE, 2 MG/1.5ML SOPN, INJECT 0.5 MG UNDER THE SKIN ON THE SAME DAY OF EACH WEEK, IN THE ABDOMEN,  THIGHS, OR UPPER ARM ROTATING INJECTION SITES, Disp: 1.5 mL, Rfl: 1 .  SUMAtriptan (IMITREX) 50 MG tablet, Take 1 tablet (50 mg total) by mouth every 2 (two) hours as needed., Disp: 10 tablet, Rfl: 2 .  traMADol (ULTRAM) 50 MG tablet, Take 1 tablet (50 mg total) by mouth every 6 (six) hours as needed., Disp: 30 tablet, Rfl: 0 .  TRESIBA FLEXTOUCH 200 UNIT/ML SOPN, INJECT 30 UNITS UNDER THE SKIN AT BEDTIME (MAX DOSE IS 80 UNITS), Disp: 9 mL, Rfl: 1 .  UNIFINE PENTIPS 31G X 5 MM MISC, USE AS DIRECTED WITH VICTOZA PEN, Disp: 100 each, Rfl: 4 .  valACYclovir (VALTREX) 500 MG tablet, Take 500 mg by mouth 3 (three) times daily., Disp: , Rfl: 0 .  pregabalin (LYRICA) 100 MG capsule, Take 1 capsule (100 mg total) by mouth 2 (two) times daily., Disp: 60 capsule, Rfl: 2   Allergies  Allergen Reactions  . Latex Hives     Review of Systems  Constitutional: Negative.        She wants help with weight management. States she does not have time to exercise b/c she works so many hours.   Respiratory: Negative.   Cardiovascular: Negative.  Gastrointestinal: Negative.   Neurological: Negative.   Psychiatric/Behavioral: Negative.      Today's Vitals   02/19/19 1515  BP: 122/68  Pulse: 69  Temp: 98.6 F (37 C)  TempSrc: Oral  SpO2: 97%  Weight: 188 lb 12.8 oz (85.6 kg)  Height: 5' 0.8" (1.544 m)   Body mass index is 35.91 kg/m.   Objective:  Physical Exam Vitals signs and nursing note reviewed.  Constitutional:      Appearance: Normal appearance.  HENT:     Head: Normocephalic and atraumatic.  Cardiovascular:     Rate and Rhythm: Normal rate and regular rhythm.     Heart sounds: Normal heart sounds.  Pulmonary:     Effort: Pulmonary effort is normal.     Breath sounds: Normal breath sounds.  Skin:    General: Skin is warm.  Neurological:     General: No focal deficit present.     Mental Status: She is alert.  Psychiatric:        Mood and Affect: Mood normal.        Behavior:  Behavior normal.         Assessment And Plan:     1. Diabetic mononeuropathy associated with type 2 diabetes mellitus ([place])  Improved with use of Lyrica 75mg  twice daily. However, her sx are not yet well controlled. She is in agreement to dose increase to 100mg  twice daily. Rx will be sent to the pharmacy. She will rto in 2 months for her next a1c check.   2. Class 2 severe obesity due to excess calories with serious comorbidity and body mass index (BMI) of 35.0 to 35.9 in adult [facility])  She is encouraged to incorporate more exercise into her daily routine. She is agreeable to referral to [facility] clinic.   - Amb Ref to Medical Weight Management        [name], MD    THE PATIENT IS ENCOURAGED TO PRACTICE SOCIAL DISTANCING DUE TO THE COVID-19 PANDEMIC.

## 2019-02-24 ENCOUNTER — Other Ambulatory Visit: Payer: Self-pay | Admitting: Internal Medicine

## 2019-02-26 MED FILL — OZEMPIC 0.25 OR 0.5 MG/DOSE: 2 | 28 days supply | Qty: 2 | Fill #0

## 2019-02-26 MED FILL — TRESIBA FLEXTOUCH 200 UNITS: 200 | 23 days supply | Qty: 9 | Fill #0

## 2019-03-03 ENCOUNTER — Other Ambulatory Visit (HOSPITAL_COMMUNITY)
Admission: RE | Admit: 2019-03-03 | Discharge: 2019-03-03 | Disposition: A | Discharge status: Home / Self Care | Payer: 59 | Source: Ambulatory Visit | Attending: Gastroenterology | Admitting: Gastroenterology

## 2019-03-03 DIAGNOSIS — Z20828 Contact with and (suspected) exposure to other viral communicable diseases: Secondary | ICD-10-CM | POA: Insufficient documentation

## 2019-03-03 DIAGNOSIS — Z01812 Encounter for preprocedural laboratory examination: Secondary | ICD-10-CM | POA: Insufficient documentation

## 2019-03-04 LAB — NOVEL CORONAVIRUS, NAA (HOSP ORDER, SEND-OUT TO REF LAB; TAT 18-24 HRS): SARS-CoV-2, NAA: NOT DETECTED

## 2019-03-05 ENCOUNTER — Other Ambulatory Visit: Payer: Self-pay

## 2019-03-05 ENCOUNTER — Encounter (HOSPITAL_COMMUNITY): Payer: Self-pay | Admitting: Emergency Medicine

## 2019-03-05 NOTE — Progress Notes (Signed)
Pre-op endo call completed. Patient made aware she may be asked to remove her continuous glucose sensor.

## 2019-03-05 NOTE — Progress Notes (Signed)
Spoke with patient regarding Covid test. Has been quarantined, no signs or symptoms. All questions answered.

## 2019-03-06 ENCOUNTER — Encounter (HOSPITAL_COMMUNITY): Payer: Self-pay | Admitting: *Deleted

## 2019-03-06 ENCOUNTER — Encounter (HOSPITAL_COMMUNITY)
Admission: RE | Disposition: A | Discharge status: Home / Self Care | Payer: Self-pay | Source: Home / Self Care | Attending: Gastroenterology

## 2019-03-06 ENCOUNTER — Ambulatory Visit (HOSPITAL_COMMUNITY): Payer: 59 | Admitting: Anesthesiology

## 2019-03-06 ENCOUNTER — Ambulatory Visit (HOSPITAL_COMMUNITY)
Admission: RE | Admit: 2019-03-06 | Discharge: 2019-03-06 | Disposition: A | Discharge status: Home / Self Care | Payer: 59 | Attending: Gastroenterology | Admitting: Gastroenterology

## 2019-03-06 DIAGNOSIS — D509 Iron deficiency anemia, unspecified: Secondary | ICD-10-CM | POA: Diagnosis not present

## 2019-03-06 DIAGNOSIS — K760 Fatty (change of) liver, not elsewhere classified: Secondary | ICD-10-CM | POA: Diagnosis not present

## 2019-03-06 DIAGNOSIS — E1141 Type 2 diabetes mellitus with diabetic mononeuropathy: Secondary | ICD-10-CM | POA: Diagnosis not present

## 2019-03-06 DIAGNOSIS — E1140 Type 2 diabetes mellitus with diabetic neuropathy, unspecified: Secondary | ICD-10-CM | POA: Insufficient documentation

## 2019-03-06 DIAGNOSIS — R933 Abnormal findings on diagnostic imaging of other parts of digestive tract: Secondary | ICD-10-CM | POA: Diagnosis not present

## 2019-03-06 DIAGNOSIS — R000 Tachycardia, unspecified: Secondary | ICD-10-CM | POA: Diagnosis not present

## 2019-03-06 DIAGNOSIS — Z91040 Latex allergy status: Secondary | ICD-10-CM | POA: Insufficient documentation

## 2019-03-06 HISTORY — PX: UPPER ESOPHAGEAL ENDOSCOPIC ULTRASOUND (EUS): SHX6562

## 2019-03-06 HISTORY — DX: Fatty (change of) liver, not elsewhere classified: K76.0

## 2019-03-06 HISTORY — PX: ENTEROSCOPY: SHX5533

## 2019-03-06 LAB — GLUCOSE, CAPILLARY: Glucose-Capillary: 179 mg/dL — ABNORMAL HIGH (ref 70–99)

## 2019-03-06 SURGERY — ENTEROSCOPY
Anesthesia: Monitor Anesthesia Care

## 2019-03-06 MED ORDER — PROPOFOL 10 MG/ML IV BOLUS
INTRAVENOUS | Status: AC
  Filled 2019-03-06: qty 40

## 2019-03-06 MED ORDER — PROPOFOL 500 MG/50ML IV EMUL
INTRAVENOUS | Status: DC | PRN
  Administered 2019-03-06: 150 ug/kg/min via INTRAVENOUS

## 2019-03-06 MED ORDER — SODIUM CHLORIDE 0.9 % IV SOLN: INTRAVENOUS | Status: DC

## 2019-03-06 MED ORDER — PROPOFOL 500 MG/50ML IV EMUL
INTRAVENOUS | Status: DC | PRN
  Administered 2019-03-06: 30 mg via INTRAVENOUS

## 2019-03-06 MED ORDER — LACTATED RINGERS IV SOLN
INTRAVENOUS | Status: DC
  Administered 2019-03-06: 1000 mL via INTRAVENOUS

## 2019-03-06 NOTE — Anesthesia Preprocedure Evaluation (Addendum)
Anesthesia Evaluation  Patient identified by MRN, date of birth, ID band Patient awake    Reviewed: Allergy & Precautions, NPO status , Patient's Chart, lab work & pertinent test results  Airway Mallampati: I  TM Distance: >3 FB Neck ROM: Full    Dental no notable dental hx. (+) Teeth Intact, Dental Advisory Given   Pulmonary neg pulmonary ROS,    Pulmonary exam normal breath sounds clear to auscultation       Cardiovascular negative cardio ROS Normal cardiovascular exam Rhythm:Regular Rate:Normal     Neuro/Psych  Headaches, negative psych ROS   GI/Hepatic negative GI ROS, Neg liver ROS,   Endo/Other  negative endocrine ROSdiabetes, Oral Hypoglycemic Agents  Renal/GU negative Renal ROS  negative genitourinary   Musculoskeletal negative musculoskeletal ROS (+)   Abdominal   Peds  Hematology negative hematology ROS (+)   Anesthesia Other Findings Duodenal lesion  Reproductive/Obstetrics                            Anesthesia Physical Anesthesia Plan  ASA: II  Anesthesia Plan: MAC   Post-op Pain Management:    Induction: Intravenous  PONV Risk Score and Plan: Propofol infusion and Treatment may vary due to age or medical condition  Airway Management Planned: Natural Airway  Additional Equipment:   Intra-op Plan:   Post-operative Plan:   Informed Consent: I have reviewed the patients History and Physical, chart, labs and discussed the procedure including the risks, benefits and alternatives for the proposed anesthesia with the patient or authorized representative who has indicated his/her understanding and acceptance.     Dental advisory given  Plan Discussed with: CRNA  Anesthesia Plan Comments:         Anesthesia Quick Evaluation

## 2019-03-06 NOTE — Anesthesia Postprocedure Evaluation (Signed)
Anesthesia Post Note  Patient: Michelle Tanner  Procedure(s) Performed: ENTEROSCOPY (N/A ) ENDOSCOPIC ULTRASOUND (EUS)     Patient location during evaluation: Endoscopy Anesthesia Type: MAC Level of consciousness: awake and alert Pain management: pain level controlled Vital Signs Assessment: post-procedure vital signs reviewed and stable Respiratory status: spontaneous breathing, nonlabored ventilation, respiratory function stable and patient connected to nasal cannula oxygen Cardiovascular status: blood pressure returned to baseline and stable Postop Assessment: no apparent nausea or vomiting Anesthetic complications: no    Last Vitals:  Vitals:   03/06/19 0940 03/06/19 0950  BP: 114/76 113/89  Pulse: 95 91  Resp: (!) 21 (!) 22  Temp:    SpO2: 95% 95%    Last Pain:  Vitals:   03/06/19 0950  TempSrc:   PainSc: 0-No pain                 Chelsey L Woodrum

## 2019-03-06 NOTE — Op Note (Signed)
[facility] Patient Name: Michelle Tanner Procedure Date: 03/06/2019 MRN: [id number] Attending MD: Carol Ada , MD Date of Birth: 21-Nov-1970 CSN: [id number] Age: 48 Admit Type: Inpatient Procedure:                Small bowel enteroscopy Indications:              Abnormal abdominal CT Providers:                Carol Ada, MD, Ashley Jacobs, RN, Ladona Ridgel, Technician Referring MD:              Medicines:                Propofol per Anesthesia Complications:            No immediate complications. Estimated Blood Loss:     Estimated blood loss: none. Procedure:                Pre-Anesthesia Assessment:                           - Prior to the procedure, a History and Physical                            was performed, and patient medications and                            allergies were reviewed. The patient's tolerance of                            previous anesthesia was also reviewed. The risks                            and benefits of the procedure and the sedation                            options and risks were discussed with the patient.                            All questions were answered, and informed consent                            was obtained. Prior Anticoagulants: The patient has                            taken no previous anticoagulant or antiplatelet                            agents. ASA Grade Assessment: III - A patient with                            severe systemic disease. After reviewing the risks  and benefits, the patient was deemed in                            satisfactory condition to undergo the procedure.                           - Sedation was administered by an anesthesia                            professional. Deep sedation was attained.                           After obtaining informed consent, the endoscope was                            passed under direct vision.  Throughout the                            procedure, the patient's blood pressure, pulse, and                            oxygen saturations were monitored continuously. The                            [id number]) Olympus pediatric colonscope                            was introduced through the mouth and advanced to                            the small bowel distal to the Ligament of Treitz.                            The [id number]) Olympus Linear EUS was                            introduced through the and advanced to the. The                            small bowel enteroscopy was accomplished without                            difficulty. The patient tolerated the procedure                            well. Scope In: Scope Out: Findings:      The esophagus was normal.      The stomach was normal.      The examined duodenum was normal.      There was no evidence of significant pathology in the entire examined       portion of jejunum.      There was no gross evidence of any intraluminal abnormality. The       enteroscopy was converted over to an EUS for further evaluation of the  area, but no abnormalities were overtly identified. The extrinsic       compression noted with the prior EGD was secondary to fatty liver. Impression:               - Normal esophagus.                           - Normal stomach.                           - Normal examined duodenum.                           - The examined portion of the jejunum was normal.                           - No specimens collected. Recommendation:           - Patient has a contact number available for                            emergencies. The signs and symptoms of potential                            delayed complications were discussed with the                            patient. Return to normal activities tomorrow.                            Written discharge instructions were provided to the                             patient.                           - Resume regular diet.                           - Follow up and further management with Dr. Collene Mares in                            4 weeks. Procedure Code(s):        --- Professional ---                           [phone], Small intestinal endoscopy, enteroscopy                            beyond second portion of duodenum, not including                            ileum; diagnostic, including collection of                            specimen(s) by brushing or washing, when performed                            (  separate procedure) Diagnosis Code(s):        --- Professional ---                           R93.3, Abnormal findings on diagnostic imaging of                            other parts of digestive tract CPT copyright 2019 American Medical Association. All rights reserved. The codes documented in this report are preliminary and upon coder review may  be revised to meet current compliance requirements. Carol Ada, MD Carol Ada, MD 03/06/2019 9:22:01 AM This report has been signed electronically. Number of Addenda: 0

## 2019-03-06 NOTE — H&P (Signed)
  Michelle Tanner HPI: An EGD performed for findings of IDA showed extrinsic compression.  This lead to a CT scan of the abdomen and it showed an intraluminal lesion in the third portion of the duodenum.  She is here today for further evaluation of the lesion.  Past Medical History:  Diagnosis Date  . Diabetes mellitus   . Fatty liver    per patient   . Frozen shoulder    left and right   . Hx of migraine headaches   . Migraines   . Neuromuscular disorder ([place])    diabetic neuropathy    Past Surgical History:  Procedure Laterality Date  . ABLATION    . CESAREAN SECTION    . LAPAROSCOPIC ENDOMETRIOSIS FULGURATION    . POLYPECTOMY     Reports either [place] or Uterine.   . WISDOM TOOTH EXTRACTION      Family History  Problem Relation Age of Onset  . Rectal cancer Paternal Grandmother   . Diabetes Father   . Kidney disease Father   . CAD Father   . Cancer Mother   . Cancer Paternal Uncle   . Cancer Paternal Aunt     Social History:  reports that she has never smoked. She has never used smokeless tobacco. She reports current alcohol use. She reports that she does not use drugs.  Allergies:  Allergies  Allergen Reactions  . Latex Hives    Medications:  Scheduled:  Continuous: . sodium chloride    . lactated ringers 1,000 mL (03/06/19 0826)    Results for orders placed or performed during the hospital encounter of 03/06/19 (from the past 24 hour(s))  Glucose, capillary     Status: Abnormal   Collection Time: 03/06/19  8:23 AM  Result Value Ref Range   Glucose-Capillary 179 (H) 70 - 99 mg/dL     No results found.  ROS:  As stated above in the HPI otherwise negative.  Blood pressure 136/86, pulse (!) 3, temperature 98.2 F (36.8 C), temperature source Oral, resp. rate (!) 23, height 5\' 1"  (1.549 m), weight 85.7 kg, last menstrual period 02/26/2019, SpO2 97 %.    PE: Gen: NAD, Alert and Oriented HEENT:  [place]/AT, EOMI Neck: Supple, no LAD Lungs: CTA  Bilaterally CV: RRR without M/G/R ABM: Soft, NTND, +BS Ext: No C/C/E  Assessment/Plan: 1) EGD possible EUS  Saharsh Sterling D 03/06/2019, 8:34 AM

## 2019-03-06 NOTE — Discharge Instructions (Signed)

## 2019-03-06 NOTE — Transfer of Care (Signed)
Immediate Anesthesia Transfer of Care Note  Patient: Michelle Tanner  Procedure(s) Performed: ENTEROSCOPY (N/A ) ESOPHAGEAL ENDOSCOPIC ULTRASOUND (EUS)  Patient Location: PACU  Anesthesia Type:MAC  Level of Consciousness: sedated, patient cooperative and responds to stimulation  Airway & Oxygen Therapy: Patient Spontanous Breathing and Patient connected to nasal cannula oxygen  Post-op Assessment: Report given to RN and Post -op Vital signs reviewed and stable  Post vital signs: Reviewed and stable  Last Vitals:  Vitals Value Taken Time  BP    Temp    Pulse 104 03/06/19 0920  Resp 22 03/06/19 0920  SpO2 93 % 03/06/19 0920  Vitals shown include unvalidated device data.  Last Pain:  Vitals:   03/06/19 0812  TempSrc: Oral  PainSc: 0-No pain         Complications: No apparent anesthesia complications

## 2019-03-11 ENCOUNTER — Other Ambulatory Visit: Payer: 59

## 2019-03-11 ENCOUNTER — Other Ambulatory Visit: Payer: Self-pay

## 2019-03-11 DIAGNOSIS — E1141 Type 2 diabetes mellitus with diabetic mononeuropathy: Secondary | ICD-10-CM | POA: Diagnosis not present

## 2019-03-12 LAB — HEMOGLOBIN A1C
Est. average glucose Bld gHb Est-mCnc: 217 mg/dL
Hgb A1c MFr Bld: 9.2 % — ABNORMAL HIGH (ref 4.8–5.6)

## 2019-03-26 ENCOUNTER — Inpatient Hospital Stay: Payer: 59

## 2019-03-26 ENCOUNTER — Other Ambulatory Visit: Payer: Self-pay

## 2019-03-26 ENCOUNTER — Inpatient Hospital Stay: Payer: 59 | Attending: Hematology & Oncology | Admitting: Family

## 2019-03-26 ENCOUNTER — Encounter: Payer: Self-pay | Admitting: Family

## 2019-03-26 VITALS — BP 132/69 | HR 71 | Temp 97.1°F | Resp 18 | Wt 189.5 lb

## 2019-03-26 DIAGNOSIS — D500 Iron deficiency anemia secondary to blood loss (chronic): Secondary | ICD-10-CM

## 2019-03-26 DIAGNOSIS — R5383 Other fatigue: Secondary | ICD-10-CM | POA: Diagnosis not present

## 2019-03-26 DIAGNOSIS — R202 Paresthesia of skin: Secondary | ICD-10-CM | POA: Insufficient documentation

## 2019-03-26 DIAGNOSIS — Z79899 Other long term (current) drug therapy: Secondary | ICD-10-CM | POA: Insufficient documentation

## 2019-03-26 DIAGNOSIS — D560 Alpha thalassemia: Secondary | ICD-10-CM | POA: Insufficient documentation

## 2019-03-26 DIAGNOSIS — R200 Anesthesia of skin: Secondary | ICD-10-CM | POA: Diagnosis not present

## 2019-03-26 DIAGNOSIS — D509 Iron deficiency anemia, unspecified: Secondary | ICD-10-CM | POA: Insufficient documentation

## 2019-03-26 LAB — CBC WITH DIFFERENTIAL (CANCER CENTER ONLY)
Abs Immature Granulocytes: 0.14 10*3/uL — ABNORMAL HIGH (ref 0.00–0.07)
Basophils Absolute: 0.1 10*3/uL (ref 0.0–0.1)
Basophils Relative: 1 %
Eosinophils Absolute: 0.3 10*3/uL (ref 0.0–0.5)
Eosinophils Relative: 2 %
HCT: 43.6 % (ref 36.0–46.0)
Hemoglobin: 13.5 g/dL (ref 12.0–15.0)
Immature Granulocytes: 1 %
Lymphocytes Relative: 21 %
Lymphs Abs: 3 10*3/uL (ref 0.7–4.0)
MCH: 26.6 pg (ref 26.0–34.0)
MCHC: 31 g/dL (ref 30.0–36.0)
MCV: 85.8 fL (ref 80.0–100.0)
Monocytes Absolute: 0.9 10*3/uL (ref 0.1–1.0)
Monocytes Relative: 6 %
Neutro Abs: 9.9 10*3/uL — ABNORMAL HIGH (ref 1.7–7.7)
Neutrophils Relative %: 69 %
Platelet Count: 168 10*3/uL (ref 150–400)
RBC: 5.08 MIL/uL (ref 3.87–5.11)
RDW: 13.6 % (ref 11.5–15.5)
WBC Count: 14.3 10*3/uL — ABNORMAL HIGH (ref 4.0–10.5)
nRBC: 0 % (ref 0.0–0.2)

## 2019-03-26 LAB — CMP (CANCER CENTER ONLY)
ALT: 19 U/L (ref 0–44)
AST: 20 U/L (ref 15–41)
Albumin: 4.1 g/dL (ref 3.5–5.0)
Alkaline Phosphatase: 105 U/L (ref 38–126)
Anion gap: 7 (ref 5–15)
BUN: 9 mg/dL (ref 6–20)
CO2: 28 mmol/L (ref 22–32)
Calcium: 9 mg/dL (ref 8.9–10.3)
Chloride: 102 mmol/L (ref 98–111)
Creatinine: 0.58 mg/dL (ref 0.44–1.00)
GFR, Est AFR Am: >60 mL/min (ref >60)
GFR, Estimated: >60 mL/min (ref >60)
Glucose, Bld: 216 mg/dL — ABNORMAL HIGH (ref 70–99)
Potassium: 3.8 mmol/L (ref 3.5–5.1)
Sodium: 137 mmol/L (ref 135–145)
Total Bilirubin: 0.3 mg/dL (ref 0.3–1.2)
Total Protein: 7.3 g/dL (ref 6.5–8.1)

## 2019-03-26 LAB — RETICULOCYTES
Immature Retic Fract: 8.4 % (ref 2.3–15.9)
RBC.: 5.08 MIL/uL (ref 3.87–5.11)
Retic Count, Absolute: 59.9 10*3/uL (ref 19.0–186.0)
Retic Ct Pct: 1.2 % (ref 0.4–3.1)

## 2019-03-26 MED FILL — FOLIC ACID 1 MG TABS: 1 | 30 days supply | Qty: 30 | Fill #0

## 2019-03-26 MED FILL — PREGABALIN 100 MG CAPS: 100 | 30 days supply | Qty: 60 | Fill #1

## 2019-03-26 MED FILL — FREESTYLE LIBRE 14 DAY SENS: 28 days supply | Qty: 2 | Fill #1

## 2019-03-26 MED FILL — FUSION PLUS CAPSULE: 30 days supply | Qty: 30 | Fill #2

## 2019-03-26 NOTE — Progress Notes (Signed)
Hematology and Oncology Follow Up Visit  Michelle Tanner [id number] [birth year] 48 y.o. 03/26/2019   Principle Diagnosis:  Iron deficiency anemia Alpha thalassemia  Current Therapy:   IV iron as indicated  Folic acid 1 mg PO daily   Interim History:  Michelle Tanner is here today for follow-up. She is symptomatic with fatigue and craving ice.  She still has her cycle which she describes as regular with light flow and clots.  No other episodes of bleeding. No bruising.  No fever, chills, n/v, cough, rash, dizziness, SOB, chest pain, palpitations, abdominal pain or changes in bowel or bladder habits.  She gets puffiness in her feet and ankles that comes and goes.  She also has numbness and tingling in her hands and feet that comes and goes.  She has had no falls or syncopal episodes.  She is eating well and staying hydrated. Her weight is stable.   ECOG Performance Status: 1 - Symptomatic but completely ambulatory  Medications:  Allergies as of 03/26/2019      Reactions   Latex Hives      Medication List       Accurate as of March 26, 2019  3:59 PM. If you have any questions, ask your nurse or doctor.        Farxiga 10 MG Tabs tablet Generic drug: dapagliflozin propanediol TAKE 1 TABLET BY MOUTH DAILY   folic acid 1 MG tablet Commonly known as: FOLVITE TAKE 1 TABLET BY MOUTH ONCE DAILY   FreeStyle Libre 14 Day Sensor Misc 3 Devices by Does not apply route QID.   Fusion Plus Caps Take 1 capsule by mouth daily. 1 capsule daily   ibuprofen 800 MG tablet Commonly known as: ADVIL Take 800 mg by mouth every 8 (eight) hours as needed. For pain   metFORMIN 1000 MG tablet Commonly known as: GLUCOPHAGE TAKE 1 TABLET BY MOUTH TWO TIMES A DAY WITH MORNING AND EVENING MEALS What changed: See the new instructions.   multivitamin with minerals Tabs tablet Take 1 tablet by mouth daily.   Ozempic (0.25 or 0.5 MG/DOSE) 2 MG/1.5ML Sopn Generic drug: Semaglutide(0.25 or  0.5MG /DOS) INJECT 0.5 MG UNDER THE SKIN ON THE SAME DAY OF EACH WEEK, IN THE ABDOMEN, THIGHS, OR UPPER ARM ROTATING INJECTION SITES What changed: See the new instructions.   pregabalin 100 MG capsule Commonly known as: Lyrica Take 1 capsule (100 mg total) by mouth 2 (two) times daily.   SUMAtriptan 50 MG tablet Commonly known as: IMITREX Take 1 tablet (50 mg total) by mouth every 2 (two) hours as needed.   traMADol 50 MG tablet Commonly known as: Ultram Take 1 tablet (50 mg total) by mouth every 6 (six) hours as needed.   Tyler Aas FlexTouch 200 UNIT/ML Sopn Generic drug: Insulin Degludec INJECT 30 UNITS UNDER THE SKIN AT BEDTIME (MAX DOSE IS 80 UNITS) What changed: See the new instructions.   Unifine Pentips 31G X 5 MM Misc Generic drug: Insulin Pen Needle USE AS DIRECTED WITH VICTOZA PEN   valACYclovir 500 MG tablet Commonly known as: VALTREX Take 500 mg by mouth 3 (three) times daily.       Allergies:  Allergies  Allergen Reactions  . Latex Hives    Past Medical History, Surgical history, Social history, and Family History were reviewed and updated.  Review of Systems: All other 10 point review of systems is negative.   Physical Exam:  weight is 189 lb 8 oz (86 kg). Her temporal temperature is 97.1  F (36.2 C) (abnormal). Her blood pressure is 132/69 and her pulse is 71. Her respiration is 18 and oxygen saturation is 100%.   Wt Readings from Last 3 Encounters:  03/26/19 189 lb 8 oz (86 kg)  03/06/19 188 lb 15 oz (85.7 kg)  02/19/19 188 lb 12.8 oz (85.6 kg)    Ocular: Sclerae unicteric, pupils equal, round and reactive to light Ear-nose-throat: Oropharynx clear, dentition fair Lymphatic: No cervical or supraclavicular adenopathy Lungs no rales or rhonchi, good excursion bilaterally Heart regular rate and rhythm, no murmur appreciated Abd soft, nontender, positive bowel sounds, no liver or spleen tip palpated on exam, no fluid wave  MSK no focal spinal  tenderness, no joint edema Neuro: non-focal, well-oriented, appropriate affect Breasts: Deferred   Lab Results  Component Value Date   WBC 14.3 (H) 03/26/2019   HGB 13.5 03/26/2019   HCT 43.6 03/26/2019   MCV 85.8 03/26/2019   PLT 168 03/26/2019   Lab Results  Component Value Date   FERRITIN 271 02/05/2019   IRON 23 (L) 02/05/2019   TIBC 265 02/05/2019   UIBC 242 02/05/2019   IRONPCTSAT 9 (L) 02/05/2019   Lab Results  Component Value Date   RETICCTPCT 1.2 03/26/2019   RBC 5.08 03/26/2019   RBC 5.08 03/26/2019   No results found for: KPAFRELGTCHN, LAMBDASER, KAPLAMBRATIO No results found for: IGGSERUM, IGA, IGMSERUM No results found for: Odetta Pink, SPEI   Chemistry      Component Value Date/Time   NA 139 11/12/2018 1159   K 4.4 11/12/2018 1159   CL 103 11/12/2018 1159   CO2 19 (L) 11/12/2018 1159   BUN 7 11/12/2018 1159   CREATININE 0.60 12/25/2018 1529   CREATININE 0.63 08/04/2018 1405   GLU 78 08/29/2017      Component Value Date/Time   CALCIUM 9.4 11/12/2018 1159   ALKPHOS 120 (H) 11/12/2018 1159   AST 23 11/12/2018 1159   AST 20 08/04/2018 1405   ALT 21 11/12/2018 1159   ALT 19 08/04/2018 1405   BILITOT 0.2 11/12/2018 1159   BILITOT 0.2 (L) 08/04/2018 1405       Impression and Plan: Michelle Tanner is a very pleasant 48 yo female with iron deficiency anemia. We will see what her iron studies show and bring her back in for infusion if needed.  We will plan to see her back in another 3 months.  She will contact our office with any questions or concerns. We can certainly see her sooner if needed.   Laverna Peace, NP 11/5/20203:59 PM

## 2019-03-27 ENCOUNTER — Other Ambulatory Visit: Payer: Self-pay | Admitting: Gastroenterology

## 2019-03-27 ENCOUNTER — Telehealth: Payer: Self-pay | Admitting: Family

## 2019-03-27 ENCOUNTER — Other Ambulatory Visit (HOSPITAL_COMMUNITY): Payer: Self-pay | Admitting: Gastroenterology

## 2019-03-27 DIAGNOSIS — R935 Abnormal findings on diagnostic imaging of other abdominal regions, including retroperitoneum: Secondary | ICD-10-CM

## 2019-03-27 LAB — IRON AND TIBC
Iron: 59 ug/dL (ref 41–142)
Saturation Ratios: 25 % (ref 21–57)
TIBC: 236 ug/dL (ref 236–444)
UIBC: 178 ug/dL (ref 120–384)

## 2019-03-27 LAB — FERRITIN: Ferritin: 987 ng/mL — ABNORMAL HIGH (ref 11–307)

## 2019-03-27 MED FILL — VALACYCLOVIR HCL 500 MG TAB: 500 | 30 days supply | Qty: 36 | Fill #0

## 2019-03-27 NOTE — Telephone Encounter (Signed)
Appointments have been scheduled and My Chart message was sent.  Per 11/5 los

## 2019-04-09 MED FILL — OZEMPIC 0.25 OR 0.5 MG/DOSE: 2 | 28 days supply | Qty: 2 | Fill #1

## 2019-04-14 ENCOUNTER — Ambulatory Visit: Payer: 59 | Admitting: Internal Medicine

## 2019-04-14 ENCOUNTER — Encounter: Payer: Self-pay | Admitting: Internal Medicine

## 2019-04-14 ENCOUNTER — Other Ambulatory Visit: Payer: Self-pay

## 2019-04-14 VITALS — BP 120/72 | HR 95 | Temp 98.9°F | Ht 60.8 in | Wt 189.0 lb

## 2019-04-14 DIAGNOSIS — Z0000 Encounter for general adult medical examination without abnormal findings: Secondary | ICD-10-CM | POA: Diagnosis not present

## 2019-04-14 DIAGNOSIS — E66812 Obesity, class 2: Secondary | ICD-10-CM

## 2019-04-14 DIAGNOSIS — E1141 Type 2 diabetes mellitus with diabetic mononeuropathy: Secondary | ICD-10-CM | POA: Diagnosis not present

## 2019-04-14 DIAGNOSIS — Z6835 Body mass index (BMI) 35.0-35.9, adult: Secondary | ICD-10-CM

## 2019-04-14 LAB — POCT URINALYSIS DIPSTICK
Bilirubin, UA: NEGATIVE
Blood, UA: NEGATIVE
Glucose, UA: POSITIVE — AB
Ketones, UA: NEGATIVE
Leukocytes, UA: NEGATIVE
Nitrite, UA: NEGATIVE
Protein, UA: POSITIVE — AB
Spec Grav, UA: 1.025 (ref 1.010–1.025)
Urobilinogen, UA: 0.2 E.U./dL
pH, UA: 6 (ref 5.0–8.0)

## 2019-04-14 LAB — POCT UA - MICROALBUMIN
Albumin/Creatinine Ratio, Urine, POC: 30
Creatinine, POC: 200 mg/dL
Microalbumin Ur, POC: 30 mg/L

## 2019-04-14 MED ORDER — OZEMPIC (1 MG/DOSE) 2 MG/1.5ML ~~LOC~~ SOPN: 1.0000 mg | PEN_INJECTOR | SUBCUTANEOUS | 1 refills | Status: DC

## 2019-04-14 MED ORDER — PREGABALIN 150 MG PO CAPS: 150.0000 mg | ORAL_CAPSULE | Freq: Two times a day (BID) | ORAL | 2 refills | Status: DC

## 2019-04-14 MED FILL — PREGABALIN 150 MG CAPS: 150 | 30 days supply | Qty: 60 | Fill #0

## 2019-04-14 MED FILL — OZEMPIC 1 MG/DOSE SOPN: 2 | 28 days supply | Qty: 3 | Fill #0

## 2019-04-14 NOTE — Progress Notes (Signed)
Subjective:     Patient ID: Michelle Tanner , female    DOB: 11/19/1970 , 48 y.o.   MRN: [id number]   Chief Complaint  Patient presents with  . Annual Exam  . Diabetes    HPI  She is here today for a full physical examination. She is followed by Gyn, Dr. Charlesetta Garibaldi for her GYN exams. She was last seen in 2019.   Diabetes She presents for her follow-up diabetic visit. She has type 2 diabetes mellitus. There are no hypoglycemic associated symptoms. Associated symptoms include foot paresthesias. There are no hypoglycemic complications. Risk factors for coronary artery disease include diabetes mellitus, sedentary lifestyle, stress and obesity. Current diabetic treatment includes insulin injections. She is compliant with treatment most of the time. She is following a generally healthy diet. She never participates in exercise. Her breakfast blood glucose is taken between 8-9 am. Her breakfast blood glucose range is generally 110-130 mg/dl. An ACE inhibitor/angiotensin II receptor blocker is not being taken. She does not see a podiatrist.Eye exam is not current.     Past Medical History:  Diagnosis Date  . Diabetes mellitus   . Fatty liver    per patient   . Frozen shoulder    left and right   . Hx of migraine headaches   . Migraines   . Neuromuscular disorder ([place])    diabetic neuropathy     Family History  Problem Relation Age of Onset  . Rectal cancer Paternal Grandmother   . Diabetes Father   . Kidney disease Father   . CAD Father   . Cancer Mother   . Cancer Paternal Uncle   . Cancer Paternal Aunt      Current Outpatient Medications:  .  Continuous Blood Gluc Sensor (FREESTYLE LIBRE 14 DAY SENSOR) MISC, 3 Devices by Does not apply route QID., Disp: 3 each, Rfl: 6 .  FARXIGA 10 MG TABS tablet, TAKE 1 TABLET BY MOUTH DAILY, Disp: 90 tablet, Rfl: 2 .  folic acid (FOLVITE) 1 MG tablet, TAKE 1 TABLET BY MOUTH ONCE DAILY, Disp: 30 tablet, Rfl: 0 .  ibuprofen (ADVIL,MOTRIN)  800 MG tablet, Take 800 mg by mouth every 8 (eight) hours as needed. For pain, Disp: , Rfl:  .  Iron-FA-B Cmp-C-Biot-Probiotic (FUSION PLUS) CAPS, Take 1 capsule by mouth daily. 1 capsule daily , Disp: , Rfl:  .  metFORMIN (GLUCOPHAGE) 1000 MG tablet, TAKE 1 TABLET BY MOUTH TWO TIMES A DAY WITH MORNING AND EVENING MEALS (Patient taking differently: Take 1,000 mg by mouth 2 (two) times daily with a meal. ), Disp: 180 tablet, Rfl: 1 .  Multiple Vitamin (MULITIVITAMIN WITH MINERALS) TABS, Take 1 tablet by mouth daily., Disp: , Rfl:  .  SUMAtriptan (IMITREX) 50 MG tablet, Take 1 tablet (50 mg total) by mouth every 2 (two) hours as needed., Disp: 10 tablet, Rfl: 2 .  traMADol (ULTRAM) 50 MG tablet, Take 1 tablet (50 mg total) by mouth every 6 (six) hours as needed., Disp: 30 tablet, Rfl: 0 .  TRESIBA FLEXTOUCH 200 UNIT/ML SOPN, INJECT 30 UNITS UNDER THE SKIN AT BEDTIME (MAX DOSE IS 80 UNITS) (Patient taking differently: Inject 30 Units into the muscle at bedtime. ), Disp: 9 mL, Rfl: 0 .  UNIFINE PENTIPS 31G X 5 MM MISC, USE AS DIRECTED WITH VICTOZA PEN, Disp: 100 each, Rfl: 4 .  valACYclovir (VALTREX) 500 MG tablet, Take 500 mg by mouth 3 (three) times daily., Disp: , Rfl: 0 .  pregabalin (LYRICA)  150 MG capsule, Take 1 capsule (150 mg total) by mouth 2 (two) times daily., Disp: 60 capsule, Rfl: 2 .  Semaglutide, 1 MG/DOSE, (OZEMPIC, 1 MG/DOSE,) 2 MG/1.5ML SOPN, Inject 1 mg into the skin once a week., Disp: 3 pen, Rfl: 1   Allergies  Allergen Reactions  . Latex Hives     The patient states she uses none for birth control. Last LMP was Patient's last menstrual period was 03/29/2019 (exact date).. Negative for Dysmenorrhea  Negative for: breast discharge, breast lump(s), breast pain and breast self exam. Associated symptoms include abnormal vaginal bleeding. Pertinent negatives include abnormal bleeding (hematology), anxiety, decreased libido, depression, difficulty falling sleep, dyspareunia, history of  infertility, nocturia, sexual dysfunction, sleep disturbances, urinary incontinence, urinary urgency, vaginal discharge and vaginal itching. Diet regular.The patient states her exercise level is  minimal.   . The patient's tobacco use is:  Social History   Tobacco Use  Smoking Status Never Smoker  Smokeless Tobacco Never Used  . She has been exposed to passive smoke. The patient's alcohol use is:  Social History   Substance and Sexual Activity  Alcohol Use Yes   Comment: occasionally  about twice a year    Review of Systems  Constitutional: Negative.   HENT: Negative.   Eyes: Negative.   Respiratory: Negative.   Cardiovascular: Negative.   Endocrine: Negative.   Genitourinary: Negative.   Musculoskeletal: Negative.   Skin: Negative.   Allergic/Immunologic: Negative.   Neurological: Negative.   Hematological: Negative.   Psychiatric/Behavioral: Negative.      Today's Vitals   04/14/19 1518  BP: 120/72  Pulse: 95  Temp: 98.9 F (37.2 C)  TempSrc: Oral  Weight: 189 lb (85.7 kg)  Height: 5' 0.8" (1.544 m)   Body mass index is 35.95 kg/m.   Objective:  Physical Exam Vitals signs and nursing note reviewed.  Constitutional:      Appearance: Normal appearance. She is obese.  HENT:     Head: Normocephalic and atraumatic.     Right Ear: Tympanic membrane, ear canal and external ear normal.     Left Ear: Tympanic membrane, ear canal and external ear normal.     Nose:     Comments: Deferred, masked.    Mouth/Throat:     Comments: Deferred, masked Eyes:     Extraocular Movements: Extraocular movements intact.     Conjunctiva/sclera: Conjunctivae normal.     Pupils: Pupils are equal, round, and reactive to light.  Neck:     Musculoskeletal: Normal range of motion and neck supple.  Cardiovascular:     Rate and Rhythm: Normal rate and regular rhythm.     Pulses: Normal pulses.          Dorsalis pedis pulses are 2+ on the right side and 2+ on the left side.     Heart  sounds: Normal heart sounds.  Pulmonary:     Effort: Pulmonary effort is normal.     Breath sounds: Normal breath sounds.  Chest:     Breasts: Tanner Score is 5.        Right: Normal.        Left: Normal.  Abdominal:     General: Bowel sounds are normal.     Palpations: Abdomen is soft.     Comments: Obese, rounded. Difficult to assess organomegaly.   Genitourinary:    Comments: deferred Musculoskeletal: Normal range of motion.  Feet:     Right foot:     Protective Sensation: 5 sites  tested. 2 sites sensed.     Skin integrity: Callus and dry skin present.     Toenail Condition: Right toenails are normal.     Left foot:     Protective Sensation: 5 sites tested. 2 sites sensed.     Skin integrity: Callus and dry skin present.     Toenail Condition: Left toenails are normal.  Skin:    General: Skin is warm and dry.  Neurological:     General: No focal deficit present.     Mental Status: She is alert and oriented to person, place, and time.  Psychiatric:        Mood and Affect: Mood normal.        Behavior: Behavior normal.         Assessment And Plan:     1. Routine general medical examination at health care facility  A full exam was performed.  Importance of monthly self breast exams was discussed with the patient. PATIENT HAS BEEN ADVISED TO GET 30-45 MINUTES REGULAR EXERCISE NO LESS THAN FOUR TO FIVE DAYS PER WEEK - BOTH WEIGHTBEARING EXERCISES AND AEROBIC ARE RECOMMENDED.  SHE WAS ADVISED TO FOLLOW A HEALTHY DIET WITH AT LEAST SIX FRUITS/VEGGIES PER DAY, DECREASE INTAKE OF RED MEAT, AND TO INCREASE FISH INTAKE TO TWO DAYS PER WEEK.  MEATS/FISH SHOULD NOT BE FRIED, BAKED OR BROILED IS PREFERABLE.  I SUGGEST WEARING SPF 50 SUNSCREEN ON EXPOSED PARTS AND ESPECIALLY WHEN IN THE DIRECT SUNLIGHT FOR AN EXTENDED PERIOD OF TIME.  PLEASE AVOID FAST FOOD RESTAURANTS AND INCREASE YOUR WATER INTAKE.   2. Diabetic mononeuropathy associated with type 2 diabetes mellitus  ([place])  Diabetic foot exam was performed.  Additionally, her dose of Lyrica was increased to 150mg  twice daily. A Rx for this was sent to the pharmacy. I DISCUSSED WITH THE PATIENT AT LENGTH REGARDING THE GOALS OF GLYCEMIC CONTROL AND POSSIBLE LONG-TERM COMPLICATIONS.  I  ALSO STRESSED THE IMPORTANCE OF COMPLIANCE WITH HOME GLUCOSE MONITORING, DIETARY RESTRICTIONS INCLUDING AVOIDANCE OF SUGARY DRINKS/PROCESSED FOODS,  ALONG WITH REGULAR EXERCISE.  I  ALSO STRESSED THE IMPORTANCE OF ANNUAL EYE EXAMS, SELF FOOT CARE AND COMPLIANCE WITH OFFICE VISITS.  - Hemoglobin A1c - POCT Urinalysis Dipstick (81002) - POCT UA - Microalbumin - EKG 12-Lead  3. Class 2 severe obesity due to excess calories with serious comorbidity and body mass index (BMI) of 35.0 to 35.9 in adult [facility])  Importance of achieving optimal weight to decrease risk of cardiovascular disease and cancers was discussed with the patient in full detail. She is encouraged to start slowly - start with 10 minutes twice daily at least three to four days per week and to gradually build to 30 minutes five days weekly. She was given tips to incorporate more activity into her daily routine - take stairs when possible, park farther away from her job, grocery stores, etc.     [name], MD    THE PATIENT IS ENCOURAGED TO PRACTICE SOCIAL DISTANCING DUE TO THE COVID-19 PANDEMIC.

## 2019-04-14 NOTE — Patient Instructions (Signed)
Health Maintenance, Female Adopting a healthy lifestyle and getting preventive care are important in promoting health and wellness. Ask your health care provider about:  The right schedule for you to have regular tests and exams.  Things you can do on your own to prevent diseases and keep yourself healthy. What should I know about diet, weight, and exercise? Eat a healthy diet   Eat a diet that includes plenty of vegetables, fruits, low-fat dairy products, and lean protein.  Do not eat a lot of foods that are high in solid fats, added sugars, or sodium. Maintain a healthy weight Body mass index (BMI) is used to identify weight problems. It estimates body fat based on height and weight. Your health care provider can help determine your BMI and help you achieve or maintain a healthy weight. Get regular exercise Get regular exercise. This is one of the most important things you can do for your health. Most adults should:  Exercise for at least 150 minutes each week. The exercise should increase your heart rate and make you sweat (moderate-intensity exercise).  Do strengthening exercises at least twice a week. This is in addition to the moderate-intensity exercise.  Spend less time sitting. Even light physical activity can be beneficial. Watch cholesterol and blood lipids Have your blood tested for lipids and cholesterol at 48 years of age, then have this test every 5 years. Have your cholesterol levels checked more often if:  Your lipid or cholesterol levels are high.  You are older than 48 years of age.  You are at high risk for heart disease. What should I know about cancer screening? Depending on your health history and family history, you may need to have cancer screening at various ages. This may include screening for:  Breast cancer.  Cervical cancer.  Colorectal cancer.  Skin cancer.  Lung cancer. What should I know about heart disease, diabetes, and high blood  pressure? Blood pressure and heart disease  High blood pressure causes heart disease and increases the risk of stroke. This is more likely to develop in people who have high blood pressure readings, are of African descent, or are overweight.  Have your blood pressure checked: ? Every 3-5 years if you are 18-39 years of age. ? Every year if you are 40 years old or older. Diabetes Have regular diabetes screenings. This checks your fasting blood sugar level. Have the screening done:  Once every three years after age 40 if you are at a normal weight and have a low risk for diabetes.  More often and at a younger age if you are overweight or have a high risk for diabetes. What should I know about preventing infection? Hepatitis B If you have a higher risk for hepatitis B, you should be screened for this virus. Talk with your health care provider to find out if you are at risk for hepatitis B infection. Hepatitis C Testing is recommended for:  Everyone born from 1945 through 1965.  Anyone with known risk factors for hepatitis C. Sexually transmitted infections (STIs)  Get screened for STIs, including gonorrhea and chlamydia, if: ? You are sexually active and are younger than 48 years of age. ? You are older than 48 years of age and your health care provider tells you that you are at risk for this type of infection. ? Your sexual activity has changed since you were last screened, and you are at increased risk for chlamydia or gonorrhea. Ask your health care provider if   you are at risk.  Ask your health care provider about whether you are at high risk for HIV. Your health care provider may recommend a prescription medicine to help prevent HIV infection. If you choose to take medicine to prevent HIV, you should first get tested for HIV. You should then be tested every 3 months for as long as you are taking the medicine. Pregnancy  If you are about to stop having your period (premenopausal) and  you may become pregnant, seek counseling before you get pregnant.  Take 400 to 800 micrograms (mcg) of folic acid every day if you become pregnant.  Ask for birth control (contraception) if you want to prevent pregnancy. Osteoporosis and menopause Osteoporosis is a disease in which the bones lose minerals and strength with aging. This can result in bone fractures. If you are 65 years old or older, or if you are at risk for osteoporosis and fractures, ask your health care provider if you should:  Be screened for bone loss.  Take a calcium or vitamin D supplement to lower your risk of fractures.  Be given hormone replacement therapy (HRT) to treat symptoms of menopause. Follow these instructions at home: Lifestyle  Do not use any products that contain nicotine or tobacco, such as cigarettes, e-cigarettes, and chewing tobacco. If you need help quitting, ask your health care provider.  Do not use street drugs.  Do not share needles.  Ask your health care provider for help if you need support or information about quitting drugs. Alcohol use  Do not drink alcohol if: ? Your health care provider tells you not to drink. ? You are pregnant, may be pregnant, or are planning to become pregnant.  If you drink alcohol: ? Limit how much you use to 0-1 drink a day. ? Limit intake if you are breastfeeding.  Be aware of how much alcohol is in your drink. In the [place] one drink equals one 12 oz bottle of beer (355 mL), one 5 oz glass of wine (148 mL), or one 1 oz glass of hard liquor (44 mL). General instructions  Schedule regular health, dental, and eye exams.  Stay current with your vaccines.  Tell your health care provider if: ? You often feel depressed. ? You have ever been abused or do not feel safe at home. Summary  Adopting a healthy lifestyle and getting preventive care are important in promoting health and wellness.  Follow your health care provider's instructions about healthy  diet, exercising, and getting tested or screened for diseases.  Follow your health care provider's instructions on monitoring your cholesterol and blood pressure. This information is not intended to replace advice given to you by your health care provider. Make sure you discuss any questions you have with your health care provider. Document Released: 11/20/2010 Document Revised: 04/30/2018 Document Reviewed: 04/30/2018 Elsevier Patient Education  2020 [employer]  

## 2019-04-16 ENCOUNTER — Other Ambulatory Visit
Admission: RE | Admit: 2019-04-16 | Discharge: 2019-04-16 | Disposition: A | Discharge status: Home / Self Care | Payer: 59 | Source: Ambulatory Visit | Attending: Internal Medicine | Admitting: Internal Medicine

## 2019-04-16 DIAGNOSIS — E1141 Type 2 diabetes mellitus with diabetic mononeuropathy: Secondary | ICD-10-CM | POA: Insufficient documentation

## 2019-04-16 LAB — HEMOGLOBIN A1C
Hgb A1c MFr Bld: 9.5 % — ABNORMAL HIGH (ref 4.8–5.6)
Mean Plasma Glucose: 225.95 mg/dL

## 2019-04-22 ENCOUNTER — Telehealth: Payer: Self-pay

## 2019-04-22 NOTE — Telephone Encounter (Signed)
-----   Message from Glendale Chard, MD sent at 04/18/2019  1:46 PM EST ----- Your hba1c is 9.5, higher than last month. Please take meds as directed. Please also avoid sugary beverages.   Please tell me, how can I help you reach your glycemic goal?   Take care,   RS

## 2019-04-22 NOTE — Telephone Encounter (Signed)
Left the patient a message to call back for lab results. 

## 2019-04-27 ENCOUNTER — Other Ambulatory Visit: Payer: Self-pay

## 2019-04-27 ENCOUNTER — Ambulatory Visit (HOSPITAL_COMMUNITY)
Admission: RE | Admit: 2019-04-27 | Discharge: 2019-04-27 | Disposition: A | Discharge status: Home / Self Care | Payer: 59 | Source: Ambulatory Visit | Attending: Gastroenterology | Admitting: Gastroenterology

## 2019-04-27 DIAGNOSIS — K760 Fatty (change of) liver, not elsewhere classified: Secondary | ICD-10-CM | POA: Diagnosis not present

## 2019-04-27 DIAGNOSIS — K3189 Other diseases of stomach and duodenum: Secondary | ICD-10-CM | POA: Diagnosis not present

## 2019-04-27 DIAGNOSIS — R935 Abnormal findings on diagnostic imaging of other abdominal regions, including retroperitoneum: Secondary | ICD-10-CM | POA: Diagnosis not present

## 2019-04-27 MED ORDER — IOHEXOL 350 MG/ML SOLN
100.0000 mL | Freq: Once | INTRAVENOUS | Status: AC | PRN
  Administered 2019-04-27: 100 mL via INTRAVENOUS

## 2019-05-06 MED FILL — FREESTYLE LIBRE 14 DAY SENS: 28 days supply | Qty: 2 | Fill #2

## 2019-05-11 ENCOUNTER — Other Ambulatory Visit: Payer: Self-pay | Admitting: Internal Medicine

## 2019-05-11 MED FILL — TRESIBA FLEXTOUCH 200 UNITS: 200 | 23 days supply | Qty: 9 | Fill #0

## 2019-05-11 MED FILL — UNIFINE PENTIPS 31GX3/16: 31G X 5 MM | 90 days supply | Qty: 100 | Fill #1

## 2019-05-11 MED FILL — metFORMIN HCL 1000 MG TABS: 1000 | 90 days supply | Qty: 180 | Fill #0

## 2019-05-11 MED FILL — PREGABALIN 150 MG CAPS: 150 | 30 days supply | Qty: 60 | Fill #1

## 2019-05-28 ENCOUNTER — Other Ambulatory Visit: Payer: Self-pay

## 2019-05-28 ENCOUNTER — Ambulatory Visit: Payer: 59 | Admitting: Internal Medicine

## 2019-05-28 ENCOUNTER — Encounter: Payer: Self-pay | Admitting: Internal Medicine

## 2019-05-28 VITALS — BP 110/82 | HR 98 | Temp 97.8°F | Ht 60.8 in | Wt 192.8 lb

## 2019-05-28 DIAGNOSIS — E1141 Type 2 diabetes mellitus with diabetic mononeuropathy: Secondary | ICD-10-CM

## 2019-05-28 DIAGNOSIS — R42 Dizziness and giddiness: Secondary | ICD-10-CM | POA: Diagnosis not present

## 2019-05-28 DIAGNOSIS — D560 Alpha thalassemia: Secondary | ICD-10-CM

## 2019-05-28 DIAGNOSIS — Z6835 Body mass index (BMI) 35.0-35.9, adult: Secondary | ICD-10-CM | POA: Diagnosis not present

## 2019-05-28 NOTE — Patient Instructions (Signed)
Healthy Eating Following a healthy eating pattern may help you to achieve and maintain a healthy body weight, reduce the risk of chronic disease, and live a long and productive life. It is important to follow a healthy eating pattern at an appropriate calorie level for your body. Your nutritional needs should be met primarily through food by choosing a variety of nutrient-rich foods. What are tips for following this plan? Reading food labels  Read labels and choose the following: ? Reduced or low sodium. ? Juices with 100% fruit juice. ? Foods with low saturated fats and high polyunsaturated and monounsaturated fats. ? Foods with whole grains, such as whole wheat, cracked wheat, brown rice, and wild rice. ? Whole grains that are fortified with folic acid. This is recommended for women who are pregnant or who want to become pregnant.  Read labels and avoid the following: ? Foods with a lot of added sugars. These include foods that contain brown sugar, corn sweetener, corn syrup, dextrose, fructose, glucose, high-fructose corn syrup, honey, invert sugar, lactose, malt syrup, maltose, molasses, raw sugar, sucrose, trehalose, or turbinado sugar.  Do not eat more than the following amounts of added sugar per day:  6 teaspoons (25 g) for women.  9 teaspoons (38 g) for men. ? Foods that contain processed or refined starches and grains. ? Refined grain products, such as white flour, degermed cornmeal, white bread, and white rice. Shopping  Choose nutrient-rich snacks, such as vegetables, whole fruits, and nuts. Avoid high-calorie and high-sugar snacks, such as potato chips, fruit snacks, and candy.  Use oil-based dressings and spreads on foods instead of solid fats such as butter, stick margarine, or cream cheese.  Limit pre-made sauces, mixes, and "instant" products such as flavored rice, instant noodles, and ready-made pasta.  Try more plant-protein sources, such as tofu, tempeh, black beans,  edamame, lentils, nuts, and seeds.  Explore eating plans such as the Mediterranean diet or vegetarian diet. Cooking  Use oil to saut or stir-fry foods instead of solid fats such as butter, stick margarine, or lard.  Try baking, boiling, grilling, or broiling instead of frying.  Remove the fatty part of meats before cooking.  Steam vegetables in water or broth. Meal planning   At meals, imagine dividing your plate into fourths: ? One-half of your plate is fruits and vegetables. ? One-fourth of your plate is whole grains. ? One-fourth of your plate is protein, especially lean meats, poultry, eggs, tofu, beans, or nuts.  Include low-fat dairy as part of your daily diet. Lifestyle  Choose healthy options in all settings, including home, work, school, restaurants, or stores.  Prepare your food safely: ? Wash your hands after handling raw meats. ? Keep food preparation surfaces clean by regularly washing with hot, soapy water. ? Keep raw meats separate from ready-to-eat foods, such as fruits and vegetables. ? Cook seafood, meat, poultry, and eggs to the recommended internal temperature. ? Store foods at safe temperatures. In general:  Keep cold foods at 59F (4.4C) or below.  Keep hot foods at 159F (60C) or above.  Keep your freezer at [facility] (-17.8C) or below.  Foods are no longer safe to eat when they have been between the temperatures of 40-159F (4.4-60C) for more than 2 hours. What foods should I eat? Fruits Aim to eat 2 cup-equivalents of fresh, canned (in natural juice), or frozen fruits each day. Examples of 1 cup-equivalent of fruit include 1 small apple, 8 large strawberries, 1 cup canned fruit,  cup  dried fruit, or 1 cup 100% juice. Vegetables Aim to eat 2-3 cup-equivalents of fresh and frozen vegetables each day, including different varieties and colors. Examples of 1 cup-equivalent of vegetables include 2 medium carrots, 2 cups raw, leafy greens, 1 cup chopped  vegetable (raw or cooked), or 1 medium baked potato. Grains Aim to eat 6 ounce-equivalents of whole grains each day. Examples of 1 ounce-equivalent of grains include 1 slice of bread, 1 cup ready-to-eat cereal, 3 cups popcorn, or  cup cooked rice, pasta, or cereal. Meats and other proteins Aim to eat 5-6 ounce-equivalents of protein each day. Examples of 1 ounce-equivalent of protein include 1 egg, 1/2 cup nuts or seeds, or 1 tablespoon (16 g) peanut butter. A cut of meat or fish that is the size of a deck of cards is about 3-4 ounce-equivalents.  Of the protein you eat each week, try to have at least 8 ounces come from seafood. This includes salmon, trout, herring, and anchovies. Dairy Aim to eat 3 cup-equivalents of fat-free or low-fat dairy each day. Examples of 1 cup-equivalent of dairy include 1 cup (240 mL) milk, 8 ounces (250 g) yogurt, 1 ounces (44 g) natural cheese, or 1 cup (240 mL) fortified soy milk. Fats and oils  Aim for about 5 teaspoons (21 g) per day. Choose monounsaturated fats, such as canola and olive oils, avocados, peanut butter, and most nuts, or polyunsaturated fats, such as sunflower, corn, and soybean oils, walnuts, pine nuts, sesame seeds, sunflower seeds, and flaxseed. Beverages  Aim for six 8-oz glasses of water per day. Limit coffee to three to five 8-oz cups per day.  Limit caffeinated beverages that have added calories, such as soda and energy drinks.  Limit alcohol intake to no more than 1 drink a day for nonpregnant women and 2 drinks a day for men. One drink equals 12 oz of beer (355 mL), 5 oz of wine (148 mL), or 1 oz of hard liquor (44 mL). Seasoning and other foods  Avoid adding excess amounts of salt to your foods. Try flavoring foods with herbs and spices instead of salt.  Avoid adding sugar to foods.  Try using oil-based dressings, sauces, and spreads instead of solid fats. This information is based on general [place] nutrition guidelines. For more  information, visit [employer]. Exact amounts may vary based on your nutrition needs. Summary  A healthy eating plan may help you to maintain a healthy weight, reduce the risk of chronic diseases, and stay active throughout your life.  Plan your meals. Make sure you eat the right portions of a variety of nutrient-rich foods.  Try baking, boiling, grilling, or broiling instead of frying.  Choose healthy options in all settings, including home, work, school, restaurants, or stores. This information is not intended to replace advice given to you by your health care provider. Make sure you discuss any questions you have with your health care provider. Document Revised: 08/19/2017 Document Reviewed: 08/19/2017 Elsevier Patient Education  [place].

## 2019-05-31 NOTE — Progress Notes (Signed)
This visit occurred during the SARS-CoV-2 public health emergency.  Safety protocols were in place, including screening questions prior to the visit, additional usage of staff PPE, and extensive cleaning of exam room while observing appropriate contact time as indicated for disinfecting solutions.  Subjective:     Patient ID: Michelle Tanner , female    DOB: [birth year] , 49 y.o.   MRN: [id number]   Chief Complaint  Patient presents with  . Diabetes    HPI  She is here today for DM f/u. She states her sugars have improved. She is happy that she has noticed an improvement in her sugars. She reports she has been working on her diet as well.   Diabetes She presents for her follow-up diabetic visit. She has type 2 diabetes mellitus. There are no hypoglycemic associated symptoms. There are no diabetic associated symptoms. There are no hypoglycemic complications. Risk factors for coronary artery disease include diabetes mellitus, sedentary lifestyle and obesity.     Past Medical History:  Diagnosis Date  . Diabetes mellitus   . Fatty liver    per patient   . Frozen shoulder    left and right   . Hx of migraine headaches   . Migraines   . Neuromuscular disorder ([place])    diabetic neuropathy     Family History  Problem Relation Age of Onset  . Rectal cancer Paternal Grandmother   . Diabetes Father   . Kidney disease Father   . CAD Father   . Cancer Mother   . Cancer Paternal Uncle   . Cancer Paternal Aunt      Current Outpatient Medications:  .  Continuous Blood Gluc Sensor (FREESTYLE LIBRE 14 DAY SENSOR) MISC, 3 Devices by Does not apply route QID., Disp: 3 each, Rfl: 6 .  FARXIGA 10 MG TABS tablet, TAKE 1 TABLET BY MOUTH DAILY, Disp: 90 tablet, Rfl: 2 .  folic acid (FOLVITE) 1 MG tablet, TAKE 1 TABLET BY MOUTH ONCE DAILY, Disp: 30 tablet, Rfl: 0 .  ibuprofen (ADVIL,MOTRIN) 800 MG tablet, Take 800 mg by mouth every 8 (eight) hours as needed. For pain, Disp: , Rfl:  .   Iron-FA-B Cmp-C-Biot-Probiotic (FUSION PLUS) CAPS, Take 1 capsule by mouth daily. 1 capsule daily , Disp: , Rfl:  .  metFORMIN (GLUCOPHAGE) 1000 MG tablet, TAKE 1 TABLET BY MOUTH TWO TIMES A DAY WITH MORNING AND EVENING MEALS, Disp: 180 tablet, Rfl: 0 .  Multiple Vitamin (MULITIVITAMIN WITH MINERALS) TABS, Take 1 tablet by mouth daily., Disp: , Rfl:  .  pregabalin (LYRICA) 150 MG capsule, Take 1 capsule (150 mg total) by mouth 2 (two) times daily., Disp: 60 capsule, Rfl: 2 .  Semaglutide, 1 MG/DOSE, (OZEMPIC, 1 MG/DOSE,) 2 MG/1.5ML SOPN, Inject 1 mg into the skin once a week., Disp: 3 pen, Rfl: 1 .  SUMAtriptan (IMITREX) 50 MG tablet, Take 1 tablet (50 mg total) by mouth every 2 (two) hours as needed., Disp: 10 tablet, Rfl: 2 .  traMADol (ULTRAM) 50 MG tablet, Take 1 tablet (50 mg total) by mouth every 6 (six) hours as needed., Disp: 30 tablet, Rfl: 0 .  TRESIBA FLEXTOUCH 200 UNIT/ML SOPN, INJECT 30 UNITS UNDER THE SKIN AT BEDTIME (MAX DOSE IS 80 UNITS), Disp: 9 mL, Rfl: 0 .  UNIFINE PENTIPS 31G X 5 MM MISC, USE AS DIRECTED WITH VICTOZA PEN, Disp: 100 each, Rfl: 4 .  valACYclovir (VALTREX) 500 MG tablet, Take 500 mg by mouth 3 (three) times daily., Disp: ,  Rfl: 0   Allergies  Allergen Reactions  . Latex Hives     Review of Systems  Constitutional: Negative.   Respiratory: Negative.   Cardiovascular: Negative.   Gastrointestinal: Negative.   Neurological: Positive for light-headedness.       She reports she has been having episodes of dizziness. She reports she awakened several days ago with the room spinning. She denies recent URI.   Psychiatric/Behavioral: Negative.      Today's Vitals   05/28/19 1147  BP: 110/82  Pulse: 98  Temp: 97.8 F (36.6 C)  TempSrc: Oral  Weight: 192 lb 12.8 oz (87.5 kg)  Height: 5' 0.8" (1.544 m)   Body mass index is 36.67 kg/m.   Objective:  Physical Exam Vitals and nursing note reviewed.  Constitutional:      Appearance: Normal appearance.   HENT:     Head: Normocephalic and atraumatic.     Right Ear: Tympanic membrane, ear canal and external ear normal. There is no impacted cerumen.     Left Ear: Tympanic membrane, ear canal and external ear normal. There is no impacted cerumen.     Nose:     Comments: Deferred, masked    Mouth/Throat:     Comments: Deferred, masked Cardiovascular:     Rate and Rhythm: Normal rate and regular rhythm.     Heart sounds: Normal heart sounds.  Pulmonary:     Effort: Pulmonary effort is normal.  Skin:    General: Skin is warm.  Neurological:     General: No focal deficit present.     Mental Status: She is alert.  Psychiatric:        Mood and Affect: Mood normal.        Behavior: Behavior normal.         Assessment And Plan:     1. Diabetic mononeuropathy associated with type 2 diabetes mellitus (HCC)  Chronic. She will continue with current meds for now. She will rto in 4-6 weeks for her next a1c check. Importance of dietary compliance was discussed with the patient. She was congratulated on her lifestyle changes.   2. Vertigo  Orthostatics performed. She is encouraged to stay well hydrated. She is reminded that she must drink plenty of water while on Farxiga. She will let me know if her sx persist.   3. Alpha thalassemia (HCC)  Chronic, yet stable.   4. Class 2 severe obesity due to excess calories with serious comorbidity and body mass index (BMI) of 35.0 to 35.9 in adult (HCC)  BMI 36. She is encouraged to strive for BMI less than 30 to decrease cardiac risk. She is encouraged to aim for at least 150 minutes per week.   [name], MD    THE PATIENT IS ENCOURAGED TO PRACTICE SOCIAL DISTANCING DUE TO THE COVID-19 PANDEMIC.

## 2019-06-10 ENCOUNTER — Other Ambulatory Visit: Payer: Self-pay | Admitting: Family

## 2019-06-10 DIAGNOSIS — D560 Alpha thalassemia: Secondary | ICD-10-CM

## 2019-06-10 MED FILL — FUSION PLUS CAPSULE: 30 days supply | Qty: 30 | Fill #3

## 2019-06-10 MED FILL — FARXIGA 10 MG TABLET: 10 | 90 days supply | Qty: 90 | Fill #1

## 2019-06-10 MED FILL — FREESTYLE LIBRE 14 DAY SENS: 28 days supply | Qty: 2 | Fill #3

## 2019-06-11 MED FILL — FOLIC ACID 1 MG TABS: 1 | 30 days supply | Qty: 30 | Fill #0

## 2019-06-18 ENCOUNTER — Ambulatory Visit: Payer: 59 | Admitting: Internal Medicine

## 2019-06-18 MED FILL — PREGABALIN 150 MG CAPS: 150 | 30 days supply | Qty: 60 | Fill #2

## 2019-06-18 MED FILL — OZEMPIC 1 MG/DOSE SOPN: 2 | 28 days supply | Qty: 3 | Fill #1

## 2019-06-26 ENCOUNTER — Inpatient Hospital Stay (HOSPITAL_BASED_OUTPATIENT_CLINIC_OR_DEPARTMENT_OTHER): Payer: 59 | Admitting: Family

## 2019-06-26 ENCOUNTER — Other Ambulatory Visit: Payer: Self-pay

## 2019-06-26 ENCOUNTER — Inpatient Hospital Stay: Payer: 59 | Attending: Hematology & Oncology

## 2019-06-26 VITALS — BP 134/67 | HR 80 | Temp 97.3°F | Resp 17

## 2019-06-26 DIAGNOSIS — M25511 Pain in right shoulder: Secondary | ICD-10-CM | POA: Insufficient documentation

## 2019-06-26 DIAGNOSIS — D560 Alpha thalassemia: Secondary | ICD-10-CM | POA: Diagnosis not present

## 2019-06-26 DIAGNOSIS — Z79899 Other long term (current) drug therapy: Secondary | ICD-10-CM | POA: Insufficient documentation

## 2019-06-26 DIAGNOSIS — Z791 Long term (current) use of non-steroidal anti-inflammatories (NSAID): Secondary | ICD-10-CM | POA: Insufficient documentation

## 2019-06-26 DIAGNOSIS — D509 Iron deficiency anemia, unspecified: Secondary | ICD-10-CM | POA: Diagnosis not present

## 2019-06-26 DIAGNOSIS — D500 Iron deficiency anemia secondary to blood loss (chronic): Secondary | ICD-10-CM | POA: Diagnosis not present

## 2019-06-26 DIAGNOSIS — Z794 Long term (current) use of insulin: Secondary | ICD-10-CM | POA: Diagnosis not present

## 2019-06-26 LAB — CBC WITH DIFFERENTIAL (CANCER CENTER ONLY)
Abs Immature Granulocytes: 0.15 10*3/uL — ABNORMAL HIGH (ref 0.00–0.07)
Basophils Absolute: 0.1 10*3/uL (ref 0.0–0.1)
Basophils Relative: 1 %
Eosinophils Absolute: 0.4 10*3/uL (ref 0.0–0.5)
Eosinophils Relative: 3 %
HCT: 42.6 % (ref 36.0–46.0)
Hemoglobin: 13.1 g/dL (ref 12.0–15.0)
Immature Granulocytes: 1 %
Lymphocytes Relative: 23 %
Lymphs Abs: 3 10*3/uL (ref 0.7–4.0)
MCH: 26.6 pg (ref 26.0–34.0)
MCHC: 30.8 g/dL (ref 30.0–36.0)
MCV: 86.6 fL (ref 80.0–100.0)
Monocytes Absolute: 0.7 10*3/uL (ref 0.1–1.0)
Monocytes Relative: 5 %
Neutro Abs: 9 10*3/uL — ABNORMAL HIGH (ref 1.7–7.7)
Neutrophils Relative %: 67 %
Platelet Count: 252 10*3/uL (ref 150–400)
RBC: 4.92 MIL/uL (ref 3.87–5.11)
RDW: 13 % (ref 11.5–15.5)
WBC Count: 13.3 10*3/uL — ABNORMAL HIGH (ref 4.0–10.5)
nRBC: 0 % (ref 0.0–0.2)

## 2019-06-26 LAB — RETICULOCYTES
Immature Retic Fract: 15.9 % (ref 2.3–15.9)
RBC.: 4.86 MIL/uL (ref 3.87–5.11)
Retic Count, Absolute: 91.4 10*3/uL (ref 19.0–186.0)
Retic Ct Pct: 1.9 % (ref 0.4–3.1)

## 2019-06-26 NOTE — Progress Notes (Signed)
Hematology and Oncology Follow Up Visit  VANDORA HICKEY [id number] [birth year] 49 y.o. 06/26/2019   Principle Diagnosis:  Iron deficiency anemia Alpha thalassemia  Current Therapy:   IV iron as indicated Folic acid 1 mg PO daily   Interim History:  Ms. Mahala is here today for follow-up. She is doing well and has no complaints at this time other than right shoulder pain. She states that she has arthritis and has an appointment coming up with her orthopedist to discuss possible surgery.  Hgb is stable at 13.1 and MCV 86.6.  No episodes of bleeding. No bruising or petechiae.  No fever, chills, n/v, cough, rash, dizziness, SOB, chest pain, palpitations, abdominal pain or changes in bowel or bladder habits.   She has intermittent swelling and numbness in the left foot as well as occasional numbness and tingling in the left hand.  No falls or syncope.  She has a good appetite and is staying well hydrated. Her weight is stable.   ECOG Performance Status: 1 - Symptomatic but completely ambulatory  Medications:  Allergies as of 06/26/2019      Reactions   Latex Hives      Medication List       Accurate as of June 26, 2019  3:33 PM. If you have any questions, ask your nurse or doctor.        Farxiga 10 MG Tabs tablet Generic drug: dapagliflozin propanediol TAKE 1 TABLET BY MOUTH DAILY   folic acid 1 MG tablet Commonly known as: FOLVITE TAKE 1 TABLET BY MOUTH ONCE DAILY   FreeStyle Libre 14 Day Sensor Misc 3 Devices by Does not apply route QID.   Fusion Plus Caps Take 1 capsule by mouth daily. 1 capsule daily   ibuprofen 800 MG tablet Commonly known as: ADVIL Take 800 mg by mouth every 8 (eight) hours as needed. For pain   metFORMIN 1000 MG tablet Commonly known as: GLUCOPHAGE TAKE 1 TABLET BY MOUTH TWO TIMES A DAY WITH MORNING AND EVENING MEALS   multivitamin with minerals Tabs tablet Take 1 tablet by mouth daily.   Ozempic (1 MG/DOSE) 2 MG/1.5ML  Sopn Generic drug: Semaglutide (1 MG/DOSE) Inject 1 mg into the skin once a week.   pregabalin 150 MG capsule Commonly known as: Lyrica Take 1 capsule (150 mg total) by mouth 2 (two) times daily.   SUMAtriptan 50 MG tablet Commonly known as: IMITREX Take 1 tablet (50 mg total) by mouth every 2 (two) hours as needed.   traMADol 50 MG tablet Commonly known as: Ultram Take 1 tablet (50 mg total) by mouth every 6 (six) hours as needed.   Tyler Aas FlexTouch 200 UNIT/ML Sopn Generic drug: Insulin Degludec INJECT 30 UNITS UNDER THE SKIN AT BEDTIME (MAX DOSE IS 80 UNITS)   Unifine Pentips 31G X 5 MM Misc Generic drug: Insulin Pen Needle USE AS DIRECTED WITH VICTOZA PEN   valACYclovir 500 MG tablet Commonly known as: VALTREX Take 500 mg by mouth 3 (three) times daily.       Allergies:  Allergies  Allergen Reactions  . Latex Hives    Past Medical History, Surgical history, Social history, and Family History were reviewed and updated.  Review of Systems: All other 10 point review of systems is negative.   Physical Exam:  vitals were not taken for this visit.   Wt Readings from Last 3 Encounters:  05/28/19 192 lb 12.8 oz (87.5 kg)  04/14/19 189 lb (85.7 kg)  03/26/19 189 lb 8  oz (86 kg)    Ocular: Sclerae unicteric, pupils equal, round and reactive to light Ear-nose-throat: Oropharynx clear, dentition fair Lymphatic: No cervical or supraclavicular adenopathy Lungs no rales or rhonchi, good excursion bilaterally Heart regular rate and rhythm, no murmur appreciated Abd soft, nontender, positive bowel sounds, no liver or spleen tip palpated on exam, no fluid wave  MSK no focal spinal tenderness, no joint edema Neuro: non-focal, well-oriented, appropriate affect Breasts: Deferred   Lab Results  Component Value Date   WBC 14.3 (H) 03/26/2019   HGB 13.5 03/26/2019   HCT 43.6 03/26/2019   MCV 85.8 03/26/2019   PLT 168 03/26/2019   Lab Results  Component Value Date    FERRITIN 987 (H) 03/26/2019   IRON 59 03/26/2019   TIBC 236 03/26/2019   UIBC 178 03/26/2019   IRONPCTSAT 25 03/26/2019   Lab Results  Component Value Date   RETICCTPCT 1.2 03/26/2019   RBC 5.08 03/26/2019   RBC 5.08 03/26/2019   No results found for: KPAFRELGTCHN, LAMBDASER, KAPLAMBRATIO No results found for: IGGSERUM, IGA, IGMSERUM No results found for: Odetta Pink, SPEI   Chemistry      Component Value Date/Time   NA 137 03/26/2019 1516   NA 139 11/12/2018 1159   K 3.8 03/26/2019 1516   CL 102 03/26/2019 1516   CO2 28 03/26/2019 1516   BUN 9 03/26/2019 1516   BUN 7 11/12/2018 1159   CREATININE 0.58 03/26/2019 1516   GLU 78 08/29/2017 0000      Component Value Date/Time   CALCIUM 9.0 03/26/2019 1516   ALKPHOS 105 03/26/2019 1516   AST 20 03/26/2019 1516   ALT 19 03/26/2019 1516   BILITOT 0.3 03/26/2019 1516       Impression and Plan: Ms. Delisser is a very pleasant 49yo female with iron deficiency anemia. We will see what her iron studies look like and replace if needed.  We will see her again in 4 months.  She will contact our office with any questions or concerns. We can certainly see her sooner if needed.   Laverna Peace, NP 2/5/20213:33 PM

## 2019-06-29 ENCOUNTER — Telehealth: Payer: Self-pay | Admitting: Family

## 2019-06-29 LAB — IRON AND TIBC
Iron: 37 ug/dL — ABNORMAL LOW (ref 41–142)
Saturation Ratios: 16 % — ABNORMAL LOW (ref 21–57)
TIBC: 240 ug/dL (ref 236–444)
UIBC: 203 ug/dL (ref 120–384)

## 2019-06-29 LAB — FERRITIN: Ferritin: 464 ng/mL — ABNORMAL HIGH (ref 11–307)

## 2019-06-29 MED FILL — IBUPROFEN 800 MG TABS: 800 | 6 days supply | Qty: 24 | Fill #0

## 2019-06-29 NOTE — Telephone Encounter (Signed)
Called and spoke with patient regarding appointments added per 2/5 los

## 2019-06-30 ENCOUNTER — Telehealth: Payer: Self-pay | Admitting: Hematology & Oncology

## 2019-06-30 NOTE — Telephone Encounter (Signed)
Called and spoke with patient about appointments added per 2/8 sch msg

## 2019-07-01 ENCOUNTER — Other Ambulatory Visit: Payer: Self-pay | Admitting: Family

## 2019-07-01 ENCOUNTER — Inpatient Hospital Stay: Payer: 59

## 2019-07-01 ENCOUNTER — Other Ambulatory Visit: Payer: Self-pay

## 2019-07-01 VITALS — BP 137/81 | HR 88 | Temp 97.1°F | Resp 17

## 2019-07-01 DIAGNOSIS — M25511 Pain in right shoulder: Secondary | ICD-10-CM | POA: Diagnosis not present

## 2019-07-01 DIAGNOSIS — D500 Iron deficiency anemia secondary to blood loss (chronic): Secondary | ICD-10-CM

## 2019-07-01 DIAGNOSIS — Z791 Long term (current) use of non-steroidal anti-inflammatories (NSAID): Secondary | ICD-10-CM | POA: Diagnosis not present

## 2019-07-01 DIAGNOSIS — Z794 Long term (current) use of insulin: Secondary | ICD-10-CM | POA: Diagnosis not present

## 2019-07-01 DIAGNOSIS — D560 Alpha thalassemia: Secondary | ICD-10-CM | POA: Diagnosis not present

## 2019-07-01 DIAGNOSIS — D509 Iron deficiency anemia, unspecified: Secondary | ICD-10-CM | POA: Diagnosis not present

## 2019-07-01 DIAGNOSIS — Z79899 Other long term (current) drug therapy: Secondary | ICD-10-CM | POA: Diagnosis not present

## 2019-07-01 MED ORDER — SODIUM CHLORIDE 0.9 % IV SOLN
200.0000 mg | Freq: Once | INTRAVENOUS | Status: AC
  Administered 2019-07-01: 200 mg via INTRAVENOUS
  Filled 2019-07-01: qty 200

## 2019-07-01 MED ORDER — SODIUM CHLORIDE 0.9 % IV SOLN
Freq: Once | INTRAVENOUS | Status: AC
  Filled 2019-07-01: qty 250

## 2019-07-01 NOTE — Progress Notes (Signed)
Pt declined to stay for full 30 minute post infusion observation period. Pt stated she has tolerated medication before and aware to seek emergency care for any reaction. Pt verbalized understanding and had no further questions. Pt left clinic ambulatory in no apparent distress.

## 2019-07-08 ENCOUNTER — Inpatient Hospital Stay: Payer: 59

## 2019-07-09 ENCOUNTER — Ambulatory Visit: Payer: 59 | Admitting: Internal Medicine

## 2019-07-09 ENCOUNTER — Ambulatory Visit (INDEPENDENT_AMBULATORY_CARE_PROVIDER_SITE_OTHER): Payer: 59 | Admitting: Family Medicine

## 2019-07-14 DIAGNOSIS — Z1231 Encounter for screening mammogram for malignant neoplasm of breast: Secondary | ICD-10-CM | POA: Diagnosis not present

## 2019-07-15 ENCOUNTER — Other Ambulatory Visit: Payer: Self-pay

## 2019-07-15 DIAGNOSIS — E1141 Type 2 diabetes mellitus with diabetic mononeuropathy: Secondary | ICD-10-CM

## 2019-07-16 ENCOUNTER — Other Ambulatory Visit: Payer: Self-pay | Admitting: Family

## 2019-07-16 ENCOUNTER — Other Ambulatory Visit: Payer: Self-pay | Admitting: Internal Medicine

## 2019-07-16 ENCOUNTER — Encounter: Payer: Self-pay | Admitting: Internal Medicine

## 2019-07-16 ENCOUNTER — Ambulatory Visit: Payer: 59 | Admitting: Internal Medicine

## 2019-07-16 ENCOUNTER — Other Ambulatory Visit: Payer: Self-pay

## 2019-07-16 VITALS — BP 122/78 | HR 97 | Temp 98.4°F | Ht 60.8 in | Wt 192.2 lb

## 2019-07-16 DIAGNOSIS — L84 Corns and callosities: Secondary | ICD-10-CM

## 2019-07-16 DIAGNOSIS — E1141 Type 2 diabetes mellitus with diabetic mononeuropathy: Secondary | ICD-10-CM | POA: Diagnosis not present

## 2019-07-16 DIAGNOSIS — D560 Alpha thalassemia: Secondary | ICD-10-CM

## 2019-07-16 DIAGNOSIS — Z6836 Body mass index (BMI) 36.0-36.9, adult: Secondary | ICD-10-CM | POA: Diagnosis not present

## 2019-07-16 DIAGNOSIS — M25511 Pain in right shoulder: Secondary | ICD-10-CM | POA: Diagnosis not present

## 2019-07-16 DIAGNOSIS — G8929 Other chronic pain: Secondary | ICD-10-CM

## 2019-07-16 MED ORDER — TRAMADOL HCL 50 MG PO TABS: 50.0000 mg | ORAL_TABLET | Freq: Four times a day (QID) | ORAL | 0 refills | Status: DC | PRN

## 2019-07-16 MED ORDER — FARXIGA 10 MG PO TABS: 10.0000 mg | ORAL_TABLET | Freq: Every day | ORAL | 2 refills | Status: DC

## 2019-07-16 MED ORDER — FOLIC ACID 1 MG PO TABS: 1.0000 mg | ORAL_TABLET | Freq: Every day | ORAL | 1 refills | Status: DC

## 2019-07-16 MED ORDER — FUSION PLUS PO CAPS: 1.0000 | ORAL_CAPSULE | Freq: Every day | ORAL | 1 refills | Status: DC

## 2019-07-16 MED ORDER — KETOROLAC TROMETHAMINE 60 MG/2ML IM SOLN
60.0000 mg | Freq: Once | INTRAMUSCULAR | Status: AC
  Administered 2019-07-16: 17:00:00 60 mg via INTRAMUSCULAR

## 2019-07-16 MED FILL — FOLIC ACID 1 MG TABS: 1 | 90 days supply | Qty: 90 | Fill #0

## 2019-07-16 MED FILL — OZEMPIC 1 MG/DOSE SOPN: 2 | 28 days supply | Qty: 3 | Fill #2

## 2019-07-16 MED FILL — FUSION PLUS CAPSULE: 90 days supply | Qty: 90 | Fill #0

## 2019-07-16 MED FILL — traMADol HCL 50 MG TABS: 50 | 7 days supply | Qty: 30 | Fill #0

## 2019-07-17 ENCOUNTER — Other Ambulatory Visit: Payer: Self-pay | Admitting: Internal Medicine

## 2019-07-17 LAB — HEMOGLOBIN A1C
Est. average glucose Bld gHb Est-mCnc: 206 mg/dL
Hgb A1c MFr Bld: 8.8 % — ABNORMAL HIGH (ref 4.8–5.6)

## 2019-07-17 MED FILL — TRESIBA FLEXTOUCH 200 UNITS: 200 | 23 days supply | Qty: 9 | Fill #0

## 2019-07-18 NOTE — Progress Notes (Signed)
This visit occurred during the SARS-CoV-2 public health emergency.  Safety protocols were in place, including screening questions prior to the visit, additional usage of staff PPE, and extensive cleaning of exam room while observing appropriate contact time as indicated for disinfecting solutions.  Subjective:     Patient ID: Michelle Tanner , female    DOB: 1970/06/07 , 49 y.o.   MRN: [id number]   Chief Complaint  Patient presents with  . Diabetes    HPI  Diabetes She presents for her follow-up diabetic visit. She has type 2 diabetes mellitus. Pertinent negatives for diabetes include no blurred vision and no chest pain. Risk factors for coronary artery disease include sedentary lifestyle, post-menopausal and stress. She participates in exercise intermittently.     Past Medical History:  Diagnosis Date  . Diabetes mellitus   . Fatty liver    per patient   . Frozen shoulder    left and right   . Hx of migraine headaches   . Migraines   . Neuromuscular disorder ([place])    diabetic neuropathy     Family History  Problem Relation Age of Onset  . Rectal cancer Paternal Grandmother   . Diabetes Father   . Kidney disease Father   . CAD Father   . Cancer Mother   . Cancer Paternal Uncle   . Cancer Paternal Aunt      Current Outpatient Medications:  .  dapagliflozin propanediol (FARXIGA) 10 MG TABS tablet, Take 10 mg by mouth daily., Disp: 90 tablet, Rfl: 2 .  folic acid (FOLVITE) 1 MG tablet, Take 1 tablet (1 mg total) by mouth daily., Disp: 90 tablet, Rfl: 1 .  ibuprofen (ADVIL,MOTRIN) 800 MG tablet, Take 800 mg by mouth every 8 (eight) hours as needed. For pain, Disp: , Rfl:  .  Iron-FA-B Cmp-C-Biot-Probiotic (FUSION PLUS) CAPS, Take 1 capsule by mouth daily. 1 capsule daily, Disp: 90 capsule, Rfl: 1 .  metFORMIN (GLUCOPHAGE) 1000 MG tablet, TAKE 1 TABLET BY MOUTH TWO TIMES A DAY WITH MORNING AND EVENING MEALS, Disp: 180 tablet, Rfl: 0 .  Multiple Vitamin (MULITIVITAMIN  WITH MINERALS) TABS, Take 1 tablet by mouth daily., Disp: , Rfl:  .  pregabalin (LYRICA) 150 MG capsule, TAKE 1 CAPSULE (150 MG TOTAL) BY MOUTH 2 (TWO) TIMES DAILY., Disp: 60 capsule, Rfl: 2 .  Semaglutide, 1 MG/DOSE, (OZEMPIC, 1 MG/DOSE,) 2 MG/1.5ML SOPN, Inject 1 mg into the skin once a week., Disp: 3 pen, Rfl: 1 .  sitaGLIPtin (JANUVIA) 100 MG tablet, Take 100 mg by mouth daily., Disp: , Rfl:  .  SUMAtriptan (IMITREX) 50 MG tablet, Take 1 tablet (50 mg total) by mouth every 2 (two) hours as needed., Disp: 10 tablet, Rfl: 2 .  traMADol (ULTRAM) 50 MG tablet, Take 1 tablet (50 mg total) by mouth every 6 (six) hours as needed., Disp: 30 tablet, Rfl: 0 .  valACYclovir (VALTREX) 500 MG tablet, Take 500 mg by mouth 3 (three) times daily., Disp: , Rfl: 0 .  folic acid (FOLVITE) 1 MG tablet, TAKE 1 TABLET BY MOUTH ONCE DAILY, Disp: 30 tablet, Rfl: 0 .  TRESIBA FLEXTOUCH 200 UNIT/ML SOPN, INJECT 30 UNITS UNDER THE SKIN AT BEDTIME (MAX DOSE IS 80 UNITS), Disp: 9 mL, Rfl: 0   Allergies  Allergen Reactions  . Latex Hives     Review of Systems  Constitutional: Negative.   Eyes: Negative for blurred vision.  Respiratory: Negative.   Cardiovascular: Negative.  Negative for chest pain.  Gastrointestinal:  Negative.   Musculoskeletal: Positive for arthralgias.       She c/o chronic R shoulder pain. She denies fall/trauma. Has been eval by Ortho in the past. There is pain with movement.  Neurological: Negative.   Psychiatric/Behavioral: Negative.      Today's Vitals   07/16/19 1522  BP: 122/78  Pulse: 97  Temp: 98.4 F (36.9 C)  TempSrc: Oral  Weight: 192 lb 3.2 oz (87.2 kg)  Height: 5' 0.8" (1.544 m)   Body mass index is 36.56 kg/m.   Objective:  Physical Exam Vitals and nursing note reviewed.  Constitutional:      Appearance: Normal appearance.  HENT:     Head: Normocephalic and atraumatic.  Cardiovascular:     Rate and Rhythm: Normal rate and regular rhythm.     Heart sounds:  Normal heart sounds.  Pulmonary:     Effort: Pulmonary effort is normal.     Breath sounds: Normal breath sounds.  Musculoskeletal:     Comments: r shoulder tender to palpation, decreased ROM due to pain  Skin:    General: Skin is warm.  Neurological:     General: No focal deficit present.     Mental Status: She is alert.  Psychiatric:        Mood and Affect: Mood normal.        Behavior: Behavior normal.         Assessment And Plan:     1. Diabetic mononeuropathy associated with type 2 diabetes mellitus ([place])  I will check labs as listed below. Again, importance of medication and dietary compliance was discussed with the patient.   - Hemoglobin A1c  2. Chronic right shoulder pain  She was given Toradol, 60mg  IM x1. She was also given rx tramadol prn. Review of the [place] CSRS was performed in accordance of the [place] prior to dispensing any controlled drugs.  - ketorolac (TORADOL) injection 60 mg  3. Corns and callosities  I will refer her to Podiatry for further evaluation.   - Ambulatory referral to Podiatry  4. Class 2 severe obesity due to excess calories with serious comorbidity and body mass index (BMI) of 36.0 to 36.9 in adult [facility])   She is encouraged to strive for BMI less than 30 to decrease cardiac risk. She is advised to aim for at least 150 minutes per week.   [name], MD    THE PATIENT IS ENCOURAGED TO PRACTICE SOCIAL DISTANCING DUE TO THE COVID-19 PANDEMIC.

## 2019-07-21 ENCOUNTER — Other Ambulatory Visit: Payer: Self-pay | Admitting: Internal Medicine

## 2019-07-22 ENCOUNTER — Other Ambulatory Visit: Payer: Self-pay | Admitting: Internal Medicine

## 2019-07-23 ENCOUNTER — Encounter (INDEPENDENT_AMBULATORY_CARE_PROVIDER_SITE_OTHER): Payer: Self-pay | Admitting: Family Medicine

## 2019-07-23 ENCOUNTER — Ambulatory Visit (INDEPENDENT_AMBULATORY_CARE_PROVIDER_SITE_OTHER): Payer: 59 | Admitting: Family Medicine

## 2019-07-23 ENCOUNTER — Other Ambulatory Visit: Payer: Self-pay

## 2019-07-23 VITALS — BP 115/79 | HR 97 | Temp 97.8°F | Ht 60.0 in | Wt 186.0 lb

## 2019-07-23 DIAGNOSIS — Z6836 Body mass index (BMI) 36.0-36.9, adult: Secondary | ICD-10-CM

## 2019-07-23 DIAGNOSIS — Z794 Long term (current) use of insulin: Secondary | ICD-10-CM | POA: Diagnosis not present

## 2019-07-23 DIAGNOSIS — R0602 Shortness of breath: Secondary | ICD-10-CM | POA: Diagnosis not present

## 2019-07-23 DIAGNOSIS — R5383 Other fatigue: Secondary | ICD-10-CM

## 2019-07-23 DIAGNOSIS — E7849 Other hyperlipidemia: Secondary | ICD-10-CM | POA: Diagnosis not present

## 2019-07-23 DIAGNOSIS — E119 Type 2 diabetes mellitus without complications: Secondary | ICD-10-CM | POA: Diagnosis not present

## 2019-07-23 DIAGNOSIS — Z1331 Encounter for screening for depression: Secondary | ICD-10-CM | POA: Diagnosis not present

## 2019-07-23 DIAGNOSIS — Z0289 Encounter for other administrative examinations: Secondary | ICD-10-CM

## 2019-07-23 DIAGNOSIS — I10 Essential (primary) hypertension: Secondary | ICD-10-CM | POA: Diagnosis not present

## 2019-07-23 DIAGNOSIS — Z9189 Other specified personal risk factors, not elsewhere classified: Secondary | ICD-10-CM

## 2019-07-23 NOTE — Progress Notes (Signed)
Chief Complaint:   OBESITY Michelle Tanner (MR# [id number]) is a 49 y.o. female who presents for evaluation and treatment of obesity and related comorbidities. Current BMI is Body mass index is 36.33 kg/m. Michelle Tanner has been struggling with her weight for many years and has been unsuccessful in either losing weight, maintaining weight loss, or reaching her healthy weight goal.  Michelle Tanner is currently in the action stage of change and ready to dedicate time achieving and maintaining a healthier weight. Michelle Tanner is interested in becoming our patient and working on intensive lifestyle modifications including (but not limited to) diet and exercise for weight loss.  Michelle Tanner's habits were reviewed today and are as follows: Her family eats meals together, her desired weight loss is 61 lbs, she started gaining weight during pregnancy, her heaviest weight ever was 198 pounds, she is a picky eater and doesn't like to eat healthier foods, she has significant food cravings issues, she snacks frequently in the evenings, she wakes up frequently in the middle of the night to eat, she skips meals frequently, she is trying to follow a vegetarian diet, she is frequently drinking liquids with calories, she frequently eats larger portions than normal and she struggles with emotional eating.  Depression Screen Michelle Tanner's Food and Mood (modified PHQ-9) score was 13.  Depression screen PHQ 2/9 07/23/2019  Decreased Interest 3  Down, Depressed, Hopeless 1  PHQ - 2 Score 4  Altered sleeping 1  Tired, decreased energy 2  Change in appetite 2  Feeling bad or failure about yourself  1  Trouble concentrating 1  Moving slowly or fidgety/restless 2  Suicidal thoughts 0  PHQ-9 Score 13  Difficult doing work/chores Somewhat difficult   Subjective:   1. Other fatigue Michelle Tanner admits to daytime somnolence and admits to waking up still tired. Patent has a history of symptoms of daytime fatigue and morning  headache. Michelle Tanner generally gets 8 or 11 hours of sleep per night, and states that she has nightime awakenings. Snoring is not present. Apneic episodes are not present. Epworth Sleepiness Score is 15.  2. Shortness of breath on exertion Dacie notes increasing shortness of breath with exercising and seems to be worsening over time with weight gain. She notes getting out of breath sooner with activity than she used to. This has not gotten worse recently. Adeja denies shortness of breath at rest or orthopnea.  3. Type 2 diabetes mellitus without complication, with long-term current use of insulin ([place]) [name] is on multiple medications including insulin. Her glucose ranges between 80 and 300 in the last month. She feels hypoglycemic at 115 range.  4. Essential hypertension Luella's blood pressure is controlled on medications. She would like to work on diet and weight loss.  5. Other hyperlipidemia Natesha is stable on medications, and she denies chest pain.  6. At risk for hypoglycemia Shawnte is at increased risk for hypoglycemia due to changes in diet, diagnosis of diabetes, and/or insulin use. Jeweldean is currently taking insulin.   Assessment/Plan:   1. Other fatigue Michelle Tanner does feel that her weight is causing her energy to be lower than it should be. Fatigue may be related to obesity, depression or many other causes. Labs will be ordered, and in the meanwhile, Michelle Tanner will focus on self care including making healthy food choices, increasing physical activity and focusing on stress reduction.  - EKG 12-Lead - Vitamin B12 - VITAMIN D 25 Hydroxy (Vit-D Deficiency, Fractures) - T3 - T4, free - TSH -  Folate  2. Shortness of breath on exertion Michelle Tanner does feel that she gets out of breath more easily that she used to when she exercises. Michelle Tanner's shortness of breath appears to be obesity related and exercise induced. She has agreed to work on weight loss and  gradually increase exercise to treat her exercise induced shortness of breath. Will continue to monitor closely.  3. Type 2 diabetes mellitus without complication, with long-term current use of insulin ([facility]) Good blood sugar control is important to decrease the likelihood of diabetic complications such as nephropathy, neuropathy, limb loss, blindness, coronary artery disease, and death. Intensive lifestyle modification including diet, exercise and weight loss are the first line of treatment for diabetes. We will check labs today. Michelle Tanner will start her diet prescription, and hypoglycemia precautions were given.  - Microalbumin / creatinine urine ratio - C-peptide   4. Essential hypertension Michelle Tanner will start her diet, and will work on healthy weight loss and exercise to improve blood pressure control. We will watch for signs of hypotension as she continues her lifestyle modifications. We will check labs today.  - Comprehensive metabolic panel  5. Other hyperlipidemia Cardiovascular risk and specific lipid/LDL goals reviewed. We discussed several lifestyle modifications today. Michelle Tanner will start her diet, and will continue to work on exercise and weight loss efforts. We will check labs today. Orders and follow up as documented in patient record.   - Lipid Panel With LDL/HDL Ratio  6. Depression screening Michelle Tanner had a positive depression screening. Depression is commonly associated with obesity and often results in emotional eating behaviors. We will monitor this closely and work on CBT to help improve the non-hunger eating patterns. Referral to Psychology may be required if no improvement is seen as she continues in our clinic.  7. At risk for hypoglycemia Michelle Tanner was given approximately 30 minutes of counseling today regarding prevention of hypoglycemia. She was advised of symptoms of hypoglycemia. Michelle Tanner was instructed to avoid skipping meals, eat regular protein rich meals and  schedule low calorie snacks as needed.   Repetitive spaced learning was employed today to elicit superior memory formation and behavioral change  8. Class 2 severe obesity with serious comorbidity and body mass index (BMI) of 36.0 to 36.9 in adult, unspecified obesity type ([place]) Michelle Tanner is currently in the action stage of change and her goal is to continue with weight loss efforts. I recommend Michelle Tanner begin the structured treatment plan as follows:  She has agreed to the Category 1 Plan + 100 calories.  Exercise goals: No exercise has been prescribed for now, while we concentrate on nutritional changes.   Behavioral modification strategies: no skipping meals and meal planning and cooking strategies.  She was informed of the importance of frequent follow-up visits to maximize her success with intensive lifestyle modifications for her multiple health conditions. She was informed we would discuss her lab results at her next visit unless there is a critical issue that needs to be addressed sooner. Michelle Tanner agreed to keep her next visit at the agreed upon time to discuss these results.  Objective:   Blood pressure 115/79, pulse 97, temperature 97.8 F (36.6 C), temperature source Oral, height 5' (1.524 m), weight 186 lb (84.4 kg), last menstrual period 07/09/2019, SpO2 98 %. Body mass index is 36.33 kg/m.  EKG: Normal sinus rhythm, rate 95 BPM.  Indirect Calorimeter completed today shows a VO2 of 166 and a REE of 1154.  Her calculated basal metabolic rate is 99991111 thus her basal metabolic  rate is worse than expected.  General: Cooperative, alert, well developed, in no acute distress. HEENT: Conjunctivae and lids unremarkable. Cardiovascular: Regular rhythm.  Lungs: Normal work of breathing. Neurologic: No focal deficits.   Lab Results  Component Value Date   CREATININE 0.58 03/26/2019   BUN 9 03/26/2019   NA 137 03/26/2019   K 3.8 03/26/2019   CL 102 03/26/2019   CO2 28  03/26/2019   Lab Results  Component Value Date   ALT 19 03/26/2019   AST 20 03/26/2019   ALKPHOS 105 03/26/2019   BILITOT 0.3 03/26/2019   Lab Results  Component Value Date   HGBA1C 8.8 (H) 07/16/2019   HGBA1C 9.5 (H) 04/16/2019   HGBA1C 9.2 (H) 03/11/2019   HGBA1C 10.0 (H) 01/07/2019   HGBA1C 10.1 (H) 11/12/2018   No results found for: INSULIN Lab Results  Component Value Date   TSH 2.280 07/17/2018   Lab Results  Component Value Date   CHOL 102 11/12/2018   HDL 36 (L) 11/12/2018   LDLCALC 36 11/12/2018   TRIG 149 11/12/2018   CHOLHDL 2.8 11/12/2018   Lab Results  Component Value Date   WBC 13.3 (H) 06/26/2019   HGB 13.1 06/26/2019   HCT 42.6 06/26/2019   MCV 86.6 06/26/2019   PLT 252 06/26/2019   Lab Results  Component Value Date   IRON 37 (L) 06/26/2019   TIBC 240 06/26/2019   FERRITIN 464 (H) 06/26/2019   Attestation Statements:   Reviewed by clinician on day of visit: allergies, medications, problem list, medical history, surgical history, family history, social history, and previous encounter notes.   I, Trixie Dredge, am acting as transcriptionist for Dennard Nip, MD.  I have reviewed the above documentation for accuracy and completeness, and I agree with the above. -  Dennard Nip, MD
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
## 2019-07-24 LAB — COMPREHENSIVE METABOLIC PANEL
ALT: 25 IU/L (ref 0–32)
AST: 27 IU/L (ref 0–40)
Albumin/Globulin Ratio: 1.5 (ref 1.2–2.2)
Albumin: 4.3 g/dL (ref 3.8–4.8)
Alkaline Phosphatase: 138 IU/L — ABNORMAL HIGH (ref 39–117)
BUN/Creatinine Ratio: 13 (ref 9–23)
BUN: 8 mg/dL (ref 6–24)
Bilirubin Total: <0.2 mg/dL (ref 0.0–1.2)
CO2: 22 mmol/L (ref 20–29)
Calcium: 9.5 mg/dL (ref 8.7–10.2)
Chloride: 96 mmol/L (ref 96–106)
Creatinine, Ser: 0.64 mg/dL (ref 0.57–1.00)
GFR calc Af Amer: 122 mL/min/{1.73_m2} (ref >59)
GFR calc non Af Amer: 106 mL/min/{1.73_m2} (ref >59)
Globulin, Total: 2.9 g/dL (ref 1.5–4.5)
Glucose: 156 mg/dL — ABNORMAL HIGH (ref 65–99)
Potassium: 4.6 mmol/L (ref 3.5–5.2)
Sodium: 135 mmol/L (ref 134–144)
Total Protein: 7.2 g/dL (ref 6.0–8.5)

## 2019-07-24 LAB — T4, FREE: Free T4: 1.34 ng/dL (ref 0.82–1.77)

## 2019-07-24 LAB — LIPID PANEL WITH LDL/HDL RATIO
Cholesterol, Total: 119 mg/dL (ref 100–199)
HDL: 50 mg/dL (ref >39)
LDL Chol Calc (NIH): 40 mg/dL (ref 0–99)
LDL/HDL Ratio: 0.8 ratio (ref 0.0–3.2)
Triglycerides: 182 mg/dL — ABNORMAL HIGH (ref 0–149)
VLDL Cholesterol Cal: 29 mg/dL (ref 5–40)

## 2019-07-24 LAB — MICROALBUMIN / CREATININE URINE RATIO
Creatinine, Urine: 77.4 mg/dL
Microalb/Creat Ratio: 24 mg/g creat (ref 0–29)
Microalbumin, Urine: 18.9 ug/mL

## 2019-07-24 LAB — T3: T3, Total: 128 ng/dL (ref 71–180)

## 2019-07-24 LAB — TSH: TSH: 1.59 u[IU]/mL (ref 0.450–4.500)

## 2019-07-24 LAB — C-PEPTIDE: C-Peptide: 2.3 ng/mL (ref 1.1–4.4)

## 2019-07-24 LAB — VITAMIN B12: Vitamin B-12: 371 pg/mL (ref 232–1245)

## 2019-07-24 LAB — VITAMIN D 25 HYDROXY (VIT D DEFICIENCY, FRACTURES): Vit D, 25-Hydroxy: 42.7 ng/mL (ref 30.0–100.0)

## 2019-07-24 LAB — FOLATE: Folate: >20 ng/mL (ref >3.0)

## 2019-07-27 ENCOUNTER — Other Ambulatory Visit: Payer: Self-pay | Admitting: Internal Medicine

## 2019-07-28 ENCOUNTER — Other Ambulatory Visit: Payer: Self-pay | Admitting: Internal Medicine

## 2019-07-28 ENCOUNTER — Other Ambulatory Visit: Payer: Self-pay

## 2019-07-28 ENCOUNTER — Encounter: Payer: Self-pay | Admitting: Internal Medicine

## 2019-07-28 ENCOUNTER — Ambulatory Visit (INDEPENDENT_AMBULATORY_CARE_PROVIDER_SITE_OTHER): Payer: 59 | Admitting: Psychology

## 2019-07-28 DIAGNOSIS — F3289 Other specified depressive episodes: Secondary | ICD-10-CM | POA: Diagnosis not present

## 2019-07-28 MED ORDER — PREGABALIN 150 MG PO CAPS: 150.0000 mg | ORAL_CAPSULE | Freq: Two times a day (BID) | ORAL | 2 refills | Status: DC

## 2019-07-28 MED FILL — PREGABALIN 150 MG CAPS: 150 | 30 days supply | Qty: 60 | Fill #0

## 2019-07-28 NOTE — Progress Notes (Signed)
Office: [phone]  /  Fax: [phone]    Date: August 11, 2019   Appointment Start Time: 10:36am Duration: 20 minutes Provider: Glennie Isle, Psy.D. Type of Session: Individual Therapy  Location of Patient: Home Location of Provider: Provider's Home Type of Contact: Telepsychological Visit via Telephone Call  Session Content: This provider called Michelle Tanner at 10:32am as she did not present for the WebEx appointment. She noted, "I should be fine to connect." The e-mail with the secure link was re-sent. However, she was unable to connect, and provided verbal consent to proceed via a regular telephone call. As such, today's appointment was initiated 6 minutes late. Michelle Tanner is a 49 y.o. female presenting via a Telephone Call for a follow-up appointment to address the previously established treatment goal of decreasing emotional eating. Today's appointment was a telepsychological visit due to COVID-19. Michelle Tanner provided verbal consent for today's telepsychological appointment and she is aware she is responsible for securing confidentiality on her end of the session. Prior to proceeding with today's appointment, Michelle Tanner's physical location at the time of this appointment was obtained as well a phone number she could be reached at in the event of technical difficulties. Michelle Tanner and this provider participated in today's telepsychological service.   This provider conducted a brief check-in. Michelle Tanner stated, "I've been doing really good on the diet," adding she lost two pounds. She described feeling "happy" that she lost weight and is "sticking with it." This provider explored what has helped her continue to stay "focused." She stated her co-workers have been supportive and have helped her with accountability. Outside of work, she stated her daughter has been supportive by calling her daily. In addition, emotional and physical hunger was reviewed. Furthermore, psychoeducation regarding triggers  for emotional eating was provided. Michelle Tanner was provided a handout, and encouraged to utilize the handout between now and the next appointment to increase awareness of triggers and frequency. Michelle Tanner agreed. This provider also discussed behavioral strategies for specific triggers, such as placing the utensil down when conversing to avoid mindless eating. Michelle Tanner provided verbal consent during today's appointment for this provider to send a handout about triggers via e-mail. Overall, Michelle Tanner was receptive to today's appointment as evidenced by openness to sharing, responsiveness to feedback, and willingness to explore triggers for emotional eating.  Mental Status Examination:  Appearance: unable to assess  Behavior: unable to assess Mood: euthymic Affect: unable to fully assess Speech: normal in rate, volume, and tone Eye Contact: unable to assess Psychomotor Activity: unable to assess  Gait: unable to assess Thought Process: linear, logical, and goal directed  Thought Content/Perception: no hallucinations, delusions, bizarre thinking or behavior reported or observed and no evidence of suicidal and homicidal ideation, plan, and intent Orientation: time, person, place and purpose of appointment Memory/Concentration: memory, attention, language, and fund of knowledge intact  Insight/Judgment: good  Interventions:  Conducted a brief chart review Provided empathic reflections and validation Reviewed content from the previous session Employed supportive psychotherapy interventions to facilitate reduced distress and to improve coping skills with identified stressors Employed motivational interviewing skills to assess patient's willingness/desire to adhere to recommended medical treatments and assignments Psychoeducation provided regarding triggers for emotional eating  DSM-5 Diagnosis(es): 311 (F32.8) Other Specified Depressive Disorder, Emotional Eating Behaviors  Treatment Goal &  Progress: During the initial appointment with this provider, the following treatment goal was established: decrease emotional eating. Progress is limited, as Michelle Tanner has just begun treatment with this provider; however, she is receptive to the interaction and interventions and  rapport is being established.   Plan: The next appointment will be scheduled in two weeks, which will be via [employer]. The next session will focus on working towards the established treatment goal.

## 2019-08-04 ENCOUNTER — Other Ambulatory Visit: Payer: Self-pay

## 2019-08-04 ENCOUNTER — Encounter: Payer: Self-pay | Admitting: Sports Medicine

## 2019-08-04 ENCOUNTER — Ambulatory Visit: Payer: 59 | Admitting: Sports Medicine

## 2019-08-04 DIAGNOSIS — E119 Type 2 diabetes mellitus without complications: Secondary | ICD-10-CM

## 2019-08-04 DIAGNOSIS — B351 Tinea unguium: Secondary | ICD-10-CM | POA: Diagnosis not present

## 2019-08-04 DIAGNOSIS — M79674 Pain in right toe(s): Secondary | ICD-10-CM

## 2019-08-04 DIAGNOSIS — L853 Xerosis cutis: Secondary | ICD-10-CM

## 2019-08-04 DIAGNOSIS — M79675 Pain in left toe(s): Secondary | ICD-10-CM | POA: Diagnosis not present

## 2019-08-04 DIAGNOSIS — M549 Dorsalgia, unspecified: Secondary | ICD-10-CM | POA: Insufficient documentation

## 2019-08-04 DIAGNOSIS — N762 Acute vulvitis: Secondary | ICD-10-CM | POA: Insufficient documentation

## 2019-08-04 DIAGNOSIS — L84 Corns and callosities: Secondary | ICD-10-CM

## 2019-08-04 NOTE — Progress Notes (Signed)
Subjective: Michelle Tanner is a 49 y.o. female patient with history of diabetes who presents to office today complaining of long,mildly painful nails  while ambulating in shoes with sometimes ingrowing at 1st toes esp the right; unable to trim. Patient states that the glucose reading this morning was 95 mg/dl. Patient denies any new changes in medication or new problems. Admits also dry skin and callus at toes and bottom of foot. Patient denies any new cramping, numbness, burning or tingling in the legs currently on Lyrica. No other issues noted.   Review of Systems  All other systems reviewed and are negative.   Patient Active Problem List   Diagnosis Date Noted  . Backache 08/04/2019  . Vulvitis 08/04/2019  . Class 2 severe obesity due to excess calories with serious comorbidity and body mass index (BMI) of 35.0 to 35.9 in adult ([place]) 05/28/2019  . Essential (hemorrhagic) thrombocythemia ([place]) 03/13/2018  . Diabetic mononeuropathy associated with type 2 diabetes mellitus ([place]) 03/13/2018  . Tachycardia 03/10/2018  . IDA (iron deficiency anemia) 10/08/2017  . Herpes 10/24/2011  . Dysmenorrhea 10/11/2011  . Excessive or frequent menstruation 10/11/2011   Current Outpatient Medications on File Prior to Visit  Medication Sig Dispense Refill  . acetaminophen (TYLENOL) 650 MG CR tablet Take 650 mg by mouth every 8 (eight) hours as needed for pain.    Marland Kitchen acetaminophen-codeine (TYLENOL #3) 300-30 MG tablet Take by mouth every 4 (four) hours as needed for moderate pain.    Marland Kitchen amoxicillin (AMOXIL) 500 MG capsule amoxicillin 500 mg capsule    . clotrimazole (GYNE-LOTRIMIN 3) 2 % vaginal cream Gyne-Lotrimin 2 % vaginal cream  Insert 1 applicatorful by vaginal route for 7 days.    . dapagliflozin propanediol (FARXIGA) 10 MG TABS tablet Take 10 mg by mouth daily. 90 tablet 2  . diclofenac Sodium (VOLTAREN) 1 % GEL Apply topically as needed.    . Diclofenac Sodium 3 % GEL diclofenac 3 % topical  gel    . ergocalciferol (VITAMIN D2) 1.25 MG (50000 UT) capsule ergocalciferol (vitamin D2) 1,250 mcg (50,000 unit) capsule    . folic acid (FOLVITE) 1 MG tablet Take 1 tablet (1 mg total) by mouth daily. 90 tablet 1  . gabapentin (NEURONTIN) 600 MG tablet gabapentin 600 mg tablet    . ibuprofen (ADVIL,MOTRIN) 800 MG tablet Take 800 mg by mouth every 8 (eight) hours as needed. For pain    . Iron-FA-B Cmp-C-Biot-Probiotic (FUSION PLUS) CAPS Take 1 capsule by mouth daily. 1 capsule daily 90 capsule 1  . linaclotide (LINZESS) 145 MCG CAPS capsule Take 145 mcg by mouth daily before breakfast.    . metFORMIN (GLUCOPHAGE) 1000 MG tablet TAKE 1 TABLET BY MOUTH TWO TIMES A DAY WITH MORNING AND EVENING MEALS 180 tablet 0  . Multiple Vitamin (MULITIVITAMIN WITH MINERALS) TABS Take 1 tablet by mouth daily.    . polyethylene glycol (GOLYTELY) 236 g solution peg 3350-electrolytes 236 gram-22.74 gram-6.74 gram-5.86 gram solution    . pregabalin (LYRICA) 150 MG capsule Take 1 capsule (150 mg total) by mouth 2 (two) times daily. 60 capsule 2  . Semaglutide, 1 MG/DOSE, (OZEMPIC, 1 MG/DOSE,) 2 MG/1.5ML SOPN Inject 1 mg into the skin once a week. 3 pen 1  . SUMAtriptan (IMITREX) 50 MG tablet Take 1 tablet (50 mg total) by mouth every 2 (two) hours as needed. 10 tablet 2  . traMADol (ULTRAM) 50 MG tablet Take 1 tablet (50 mg total) by mouth every 6 (six) hours  as needed. 30 tablet 0  . TRESIBA FLEXTOUCH 200 UNIT/ML SOPN INJECT 30 UNITS UNDER THE SKIN AT BEDTIME (MAX DOSE IS 80 UNITS) 9 mL 0  . valACYclovir (VALTREX) 500 MG tablet Take 500 mg by mouth 3 (three) times daily.  0   No current facility-administered medications on file prior to visit.   Allergies  Allergen Reactions  . Latex Hives    Recent Results (from the past 2160 hour(s))  CBC with Differential (Cancer Center Only)     Status: Abnormal   Collection Time: 06/26/19  3:22 PM  Result Value Ref Range   WBC Count 13.3 (H) 4.0 - 10.5 K/uL   RBC  4.92 3.87 - 5.11 MIL/uL   Hemoglobin 13.1 12.0 - 15.0 g/dL   HCT 42.6 36.0 - 46.0 %   MCV 86.6 80.0 - 100.0 fL   MCH 26.6 26.0 - 34.0 pg   MCHC 30.8 30.0 - 36.0 g/dL   RDW 13.0 11.5 - 15.5 %   Platelet Count 252 150 - 400 K/uL   nRBC 0.0 0.0 - 0.2 %   Neutrophils Relative % 67 %   Neutro Abs 9.0 (H) 1.7 - 7.7 K/uL   Lymphocytes Relative 23 %   Lymphs Abs 3.0 0.7 - 4.0 K/uL   Monocytes Relative 5 %   Monocytes Absolute 0.7 0.1 - 1.0 K/uL   Eosinophils Relative 3 %   Eosinophils Absolute 0.4 0.0 - 0.5 K/uL   Basophils Relative 1 %   Basophils Absolute 0.1 0.0 - 0.1 K/uL   Immature Granulocytes 1 %   Abs Immature Granulocytes 0.15 (H) 0.00 - 0.07 K/uL    Comment: Performed at [facility] at [facility], [address] [place], [place] [address]  Reticulocytes     Status: None   Collection Time: 06/26/19  3:22 PM  Result Value Ref Range   Retic Ct Pct 1.9 0.4 - 3.1 %   RBC. 4.86 3.87 - 5.11 MIL/uL   Retic Count, Absolute 91.4 19.0 - 186.0 K/uL   Immature Retic Fract 15.9 2.3 - 15.9 %    Comment: Performed at [facility] at [facility], [address] [place], [place] [address]  Iron and TIBC     Status: Abnormal   Collection Time: 06/26/19  3:23 PM  Result Value Ref Range   Iron 37 (L) 41 - 142 ug/dL   TIBC 240 236 - 444 ug/dL   Saturation Ratios 16 (L) 21 - 57 %   UIBC 203 120 - 384 ug/dL    Comment: Performed at [facility], [address], [place], [place] [address]  Ferritin     Status: Abnormal   Collection Time: 06/26/19  3:23 PM  Result Value Ref Range   Ferritin 464 (H) 11 - 307 ng/mL    Comment: Performed at [facility], [place] [address] [place], [place] [address]  Hemoglobin A1c     Status: Abnormal   Collection Time: 07/16/19  3:53 PM  Result Value Ref Range   Hgb A1c MFr Bld 8.8 (H) 4.8 - 5.6 %    Comment:          Prediabetes: 5.7 - 6.4           Diabetes: >6.4          Glycemic control for adults with diabetes: <7.0    Est. average glucose Bld gHb Est-mCnc 206 mg/dL  Microalbumin / creatinine urine ratio     Status: None   Collection Time: 07/23/19  1:15 PM  Result Value Ref Range   Creatinine, Urine 77.4 Not Estab. mg/dL   Microalbumin, Urine 18.9 Not Estab. ug/mL   Microalb/Creat Ratio 24 0 - 29 mg/g creat    Comment:                        Normal:                0 -  29                        Moderately increased: 30 - 300                        Severely increased:       >300   C-peptide     Status: None   Collection Time: 07/23/19  1:15 PM  Result Value Ref Range   C-Peptide 2.3 1.1 - 4.4 ng/mL    Comment: C-Peptide reference interval is for fasting patients.  Vitamin B12     Status: None   Collection Time: 07/23/19  1:15 PM  Result Value Ref Range   Vitamin B-12 371 232 - 1,245 pg/mL  VITAMIN D 25 Hydroxy (Vit-D Deficiency, Fractures)     Status: None   Collection Time: 07/23/19  1:15 PM  Result Value Ref Range   Vit D, 25-Hydroxy 42.7 30.0 - 100.0 ng/mL    Comment: Vitamin D deficiency has been defined by the [place] practice guideline as a level of serum 25-OH vitamin D less than 20 ng/mL (1,2). The Endocrine Society went on to further define vitamin D insufficiency as a level between 21 and 29 ng/mL (2). 1. IOM (Institute of Medicine). 2010. Dietary reference    intakes for calcium and D. [place]: The    [employer]. 2. Holick MF, B[place], Bischoff-Ferrari HA, et al.    Evaluation, treatment, and prevention of vitamin D    deficiency: an Endocrine Society clinical practice    guideline. JCEM. 2011 Jul; 96(7):1911-30.   Lipid Panel With LDL/HDL Ratio     Status: Abnormal   Collection Time: 07/23/19  1:15 PM  Result Value Ref Range   Cholesterol, Total 119 100 - 199 mg/dL   Triglycerides 182 (H) 0 - 149 mg/dL   HDL 50 >39 mg/dL   VLDL Cholesterol  Cal 29 5 - 40 mg/dL   LDL Chol Calc (NIH) 40 0 - 99 mg/dL   LDL/HDL Ratio 0.8 0.0 - 3.2 ratio    Comment:                                     LDL/HDL Ratio                                             Men  Women                               1/2 Avg.Risk  1.0    1.5  Avg.Risk  3.6    3.2                                2X Avg.Risk  6.2    5.0                                3X Avg.Risk  8.0    6.1   Comprehensive metabolic panel     Status: Abnormal   Collection Time: 07/23/19  1:15 PM  Result Value Ref Range   Glucose 156 (H) 65 - 99 mg/dL   BUN 8 6 - 24 mg/dL   Creatinine, Ser 0.64 0.57 - 1.00 mg/dL   GFR calc non Af Amer 106 >59 mL/min/1.73   GFR calc Af Amer 122 >59 mL/min/1.73   BUN/Creatinine Ratio 13 9 - 23   Sodium 135 134 - 144 mmol/L   Potassium 4.6 3.5 - 5.2 mmol/L   Chloride 96 96 - 106 mmol/L   CO2 22 20 - 29 mmol/L   Calcium 9.5 8.7 - 10.2 mg/dL   Total Protein 7.2 6.0 - 8.5 g/dL   Albumin 4.3 3.8 - 4.8 g/dL   Globulin, Total 2.9 1.5 - 4.5 g/dL   Albumin/Globulin Ratio 1.5 1.2 - 2.2   Bilirubin Total <0.2 0.0 - 1.2 mg/dL   Alkaline Phosphatase 138 (H) 39 - 117 IU/L   AST 27 0 - 40 IU/L   ALT 25 0 - 32 IU/L  T3     Status: None   Collection Time: 07/23/19  1:15 PM  Result Value Ref Range   T3, Total 128 71 - 180 ng/dL  T4, free     Status: None   Collection Time: 07/23/19  1:15 PM  Result Value Ref Range   Free T4 1.34 0.82 - 1.77 ng/dL  TSH     Status: None   Collection Time: 07/23/19  1:15 PM  Result Value Ref Range   TSH 1.590 0.450 - 4.500 uIU/mL  Folate     Status: None   Collection Time: 07/23/19  1:15 PM  Result Value Ref Range   Folate >20.0 >3.0 ng/mL    Comment: A serum folate concentration of less than 3.1 ng/mL is considered to represent clinical deficiency.     Objective: General: Patient is awake, alert, and oriented x 3 and in no acute distress.  Integument: Skin is warm, dry and supple bilateral. Nails  are tender, long, thickened and  dystrophic with subungual debris, consistent with onychomycosis, 1-5 bilateral. No signs of infection. Corn 5th toes bilateral, + dry skin and at heels bilateral and plantar forefoot, No open lesions bilateral. Remaining integument unremarkable.  Vasculature:  Dorsalis Pedis pulse 1/4 bilateral. Posterior Tibial pulse  1/4 bilateral.  Capillary fill time <3 sec 1-5 bilateral. Positive hair growth to the level of the digits. Temperature gradient within normal limits. No varicosities present bilateral. No edema present bilateral.   Neurology: The patient has intact sensation measured with a 5.07/10g Semmes Weinstein Monofilament at all pedal sites bilateral . Vibratory sensation diminished bilateral with tuning fork. No Babinski sign present bilateral.   Musculoskeletal: Asymptomatic pes planus and hammertoe pedal deformities noted bilateral. Muscular strength 5/5 in all lower extremity muscular groups bilateral without pain on range of motion . No tenderness with calf compression bilateral.  Assessment and Plan: Problem List Items Addressed This Visit  None    Visit Diagnoses    Pain due to onychomycosis of toenails of both feet    -  Primary   Dry skin       Corns and callosities       Diabetes mellitus without complication ([place])         -Examined patient. -Discussed and educated patient on diabetic foot care, especially with  regards to the vascular, neurological and musculoskeletal systems.  -Stressed the importance of good glycemic control and the detriment of not  controlling glucose levels in relation to the foot. -Mechanically debrided all nails 1-5 bilateral using sterile nail nipper and filed with dremel without incident  -Dispensed toe caps bilateral 5th toes -Recommend daily skin emollients for callus and dry skin, gave samples of foot miracle -Answered all patient questions -Patient to return  in 3 months for at risk foot care -Patient  advised to call the office if any problems or questions arise in the meantime.  Landis Martins, DPM

## 2019-08-06 ENCOUNTER — Ambulatory Visit (INDEPENDENT_AMBULATORY_CARE_PROVIDER_SITE_OTHER): Payer: 59 | Admitting: Family Medicine

## 2019-08-06 ENCOUNTER — Other Ambulatory Visit: Payer: Self-pay

## 2019-08-06 ENCOUNTER — Encounter (INDEPENDENT_AMBULATORY_CARE_PROVIDER_SITE_OTHER): Payer: Self-pay | Admitting: Family Medicine

## 2019-08-06 VITALS — BP 115/70 | HR 95 | Temp 97.7°F | Ht 60.0 in | Wt 184.0 lb

## 2019-08-06 DIAGNOSIS — E559 Vitamin D deficiency, unspecified: Secondary | ICD-10-CM | POA: Diagnosis not present

## 2019-08-06 DIAGNOSIS — E119 Type 2 diabetes mellitus without complications: Secondary | ICD-10-CM | POA: Diagnosis not present

## 2019-08-06 DIAGNOSIS — Z6836 Body mass index (BMI) 36.0-36.9, adult: Secondary | ICD-10-CM | POA: Diagnosis not present

## 2019-08-06 DIAGNOSIS — Z794 Long term (current) use of insulin: Secondary | ICD-10-CM

## 2019-08-06 DIAGNOSIS — Z9189 Other specified personal risk factors, not elsewhere classified: Secondary | ICD-10-CM | POA: Diagnosis not present

## 2019-08-06 DIAGNOSIS — E781 Pure hyperglyceridemia: Secondary | ICD-10-CM

## 2019-08-10 NOTE — Progress Notes (Signed)
Chief Complaint:   OBESITY Michelle Tanner is here to discuss her progress with her obesity treatment plan along with follow-up of her obesity related diagnoses. Michelle Tanner is on the Category 1 Plan + 100 calories and states she is following her eating plan approximately 70% of the time. Michelle Tanner states she is doing 0 minutes 0 times per week.  Today's visit was #: 2 Starting weight: 186 lbs Starting date: 07/23/2019 Today's weight: 184 lbs Today's date: 08/06/2019 Total lbs lost to date: 2 Total lbs lost since last in-office visit: 2  Interim History: Michelle Tanner reports that the first week of following the Category 1 meal plan was a struggle. She either deviated in the morning or in the evening. The second week she was able to follow the plan much easier, and began viewing the Category 1 plan as a lifestyle change and not a diet.  Subjective:   1. Hypertriglyceridemia Michelle Tanner's triglycerides on 07/23/2019 was 182. She is not on any lipid lowering medications. I discussed labs with the patient today.  2. Type 2 diabetes mellitus without complication, with long-term current use of insulin (Michelle Tanner) Michelle Tanner's last A1c on 07/16/2019 was 8.8, and normal C-peptide on 07/23/2019. She is currently on metformin 1,000 mg BID, semaglutide 1 mg q weekly, Farixga 10 mg q daily, and Tresiba 30 units q daily. Her fasting BGs range between 69 and 104, and post prandial range between 74 and 187. I discussed labs with the patient today.  3. Vitamin D deficiency Michelle Tanner's Vit D level is close to goal. She is currently on prescription Vit D supplementation. I discussed labs with the patient today.  4. At risk for hypoglycemia Michelle Tanner is at increased risk for hypoglycemia due to multiple anti-diabetic medications, multiple low fasting/post prandial reading. Michelle Tanner is currently taking insulin.   Assessment/Plan:   1. Hypertriglyceridemia Cardiovascular risk and specific lipid/LDL goals reviewed. We  discussed several lifestyle modifications today and Michelle Tanner will continue her Category 1 meal plan, and will continue to work on exercise and weight loss efforts. We will recheck lipids in 3 months. Orders and follow up as documented in patient record.   2. Type 2 diabetes mellitus without complication, with long-term current use of insulin (Michelle Tanner) Michelle Tanner will continue her Category 1 meal plan. Good blood sugar control is important to decrease the likelihood of diabetic complications such as nephropathy, neuropathy, limb loss, blindness, coronary artery disease, and death. Intensive lifestyle modification including diet, exercise and weight loss are the first line of treatment for diabetes. Michelle Tanner agreed to decrease Tresiba from 30 units to 28 units, no other anti-diabetic medications. She will continue to monitor her BGs closely.  3. Vitamin D deficiency Low Vitamin D level contributes to fatigue and are associated with obesity, breast, and colon cancer. Michelle Tanner agreed to continue prescription Vit D supplementation, and will follow-up for routine testing of Vitamin D, at least 2-3 times per year to avoid over-replacement. We will recheck labs in 3 months.  4. At risk for hypoglycemia Michelle Tanner was given approximately 30 minutes of counseling today regarding prevention of hypoglycemia. She was advised of symptoms of hypoglycemia. Michelle Tanner was instructed to avoid skipping meals, eat regular protein rich meals and schedule low calorie snacks as needed.   Repetitive spaced learning was employed today to elicit superior memory formation and behavioral change  5. Class 2 severe obesity with serious comorbidity and body mass index (BMI) of 36.0 to 36.9 in adult, unspecified obesity type (Michelle Tanner) Michelle Tanner is currently in the action  stage of change. As such, her goal is to continue with weight loss efforts. She has agreed to the Category 1 Plan with alternative breakfast options.   Behavioral  modification strategies: meal planning and cooking strategies and dealing with family or coworker sabotage.  Michelle Tanner has agreed to follow-up with our clinic in 2 weeks. She was informed of the importance of frequent follow-up visits to maximize her success with intensive lifestyle modifications for her multiple health conditions.   Objective:   Blood pressure 115/70, pulse 95, temperature 97.7 F (36.5 C), temperature source Oral, height 5' (1.524 m), weight 184 lb (83.5 kg), last menstrual period 07/09/2019, SpO2 99 %. Body mass index is 35.94 kg/m.  General: Cooperative, alert, well developed, in no acute distress. HEENT: Conjunctivae and lids unremarkable. Cardiovascular: Regular rhythm.  Lungs: Normal work of breathing. Neurologic: No focal deficits.   Lab Results  Component Value Date   CREATININE 0.64 07/23/2019   BUN 8 07/23/2019   NA 135 07/23/2019   K 4.6 07/23/2019   CL 96 07/23/2019   CO2 22 07/23/2019   Lab Results  Component Value Date   ALT 25 07/23/2019   AST 27 07/23/2019   ALKPHOS 138 (H) 07/23/2019   BILITOT <0.2 07/23/2019   Lab Results  Component Value Date   HGBA1C 8.8 (H) 07/16/2019   HGBA1C 9.5 (H) 04/16/2019   HGBA1C 9.2 (H) 03/11/2019   HGBA1C 10.0 (H) 01/07/2019   HGBA1C 10.1 (H) 11/12/2018   No results found for: INSULIN Lab Results  Component Value Date   TSH 1.590 07/23/2019   Lab Results  Component Value Date   CHOL 119 07/23/2019   HDL 50 07/23/2019   LDLCALC 40 07/23/2019   TRIG 182 (H) 07/23/2019   CHOLHDL 2.8 11/12/2018   Lab Results  Component Value Date   WBC 13.3 (H) 06/26/2019   HGB 13.1 06/26/2019   HCT 42.6 06/26/2019   MCV 86.6 06/26/2019   PLT 252 06/26/2019   Lab Results  Component Value Date   IRON 37 (L) 06/26/2019   TIBC 240 06/26/2019   FERRITIN 464 (H) 06/26/2019   Attestation Statements:   Reviewed by clinician on day of visit: allergies, medications, problem list, medical history, surgical  history, family history, social history, and previous encounter notes.   I, Trixie Dredge, am acting as transcriptionist for Dennard Nip, MD.  I have reviewed the above documentation for accuracy and completeness, and I agree with the above. -  Dennard Nip, MD

## 2019-08-11 ENCOUNTER — Ambulatory Visit (INDEPENDENT_AMBULATORY_CARE_PROVIDER_SITE_OTHER): Payer: 59 | Admitting: Psychology

## 2019-08-11 ENCOUNTER — Other Ambulatory Visit: Payer: Self-pay

## 2019-08-11 DIAGNOSIS — F3289 Other specified depressive episodes: Secondary | ICD-10-CM | POA: Diagnosis not present

## 2019-08-11 NOTE — Progress Notes (Signed)
Office: [phone]  /  Fax: [phone]    Date: August 25, 2019   Appointment Start Time: 10:33am Duration: 22 minutes Provider: Glennie [place], Psy.D. Type of Session: Individual Therapy  Location of Patient: Home Location of Provider: Provider's Home Type of Contact: Telepsychological Visit via [employer] WebEx  Session Content: This provider called Michelle Tanner at 10:32am as she did not present for the WebEx appointment. She indicated she would join the meeting. As such, today's appointment was initiated 3 minutes late. Michelle Tanner is a 49 y.o. female presenting via [place] for a follow-up appointment to address the previously established treatment goal of decreasing emotional eating. Today's appointment was a telepsychological visit due to COVID-19. Michelle Tanner provided verbal consent for today's telepsychological appointment and she is aware she is responsible for securing confidentiality on her end of the session. Prior to proceeding with today's appointment, Michelle Tanner's physical location at the time of this appointment was obtained as well a phone number she could be reached at in the event of technical difficulties. Michelle Tanner and this provider participated in today's telepsychological service.   This provider conducted a brief check-in. Michelle Tanner reported, "I've had a rough few weeks," noting three friends recently passed away. Michelle Tanner reported, "I'm still coming to terms with it." Associated thoughts and feelings were briefly processed. She acknowledged deviations from the meal plan, adding, "I just needed to eat to feel better." Notably, she maintained her weight and described making better choices and engaging in portion control. Psychoeducation regarding the importance of self-care utilizing the oxygen mask metaphor was provided. Additionally, psychoeducation regarding pleasurable activities, including its impact on emotional eating and overall well-being was provided. Michelle Tanner was provided  with a handout with various options of pleasurable activities, and was encouraged to engage in one activity a day and additional activities as needed when triggered to emotionally eat. Michelle Tanner agreed and noted, "I think it will help me." Michelle Tanner provided verbal consent during today's appointment for this provider to send a handout about pleasurable activities via e-mail. Furthermore, she discussed having a supportive work environment, noting her co-workers have helped her with emotional eating. Michelle Tanner was receptive to today's appointment as evidenced by openness to sharing, responsiveness to feedback, and willingness to engage in pleasurable activities to assist with coping.  Mental Status Examination:  Appearance: well groomed and appropriate hygiene  Behavior: appropriate to circumstances Mood: sad Affect: mood congruent; tearful  Speech: normal in rate, volume, and tone Eye Contact: appropriate Psychomotor Activity: appropriate Gait: unable to assess Thought Process: linear, logical, and goal directed  Thought Content/Perception: no hallucinations, delusions, bizarre thinking or behavior reported or observed and no evidence of suicidal and homicidal ideation, plan, and intent Orientation: time, person, place and purpose of appointment Memory/Concentration: memory, attention, language, and fund of knowledge intact  Insight/Judgment: good  Interventions:  Conducted a brief chart review Provided empathic reflections and validation Employed supportive psychotherapy interventions to facilitate reduced distress and to improve coping skills with identified stressors Employed motivational interviewing skills to assess patient's willingness/desire to adhere to recommended medical treatments and assignments Psychoeducation provided regarding pleasurable activities Psychoeducation provided regarding self-care  DSM-5 Diagnosis(es): 311 (F32.8) Other Specified Depressive Disorder, Emotional  Eating Behaviors  Treatment Goal & Progress: During the initial appointment with this provider, the following treatment goal was established: decrease emotional eating. Michelle Tanner has demonstrated progress in her goal as evidenced by increased awareness of hunger patterns and increased awareness of triggers for emotional eating. Michelle Tanner also demonstrates willingness to engage in pleasurable activities.  Plan:  The next appointment will be scheduled in approximately two weeks, which will be via MyChart Video Visit. The next session will focus on working towards the established treatment goal.

## 2019-08-20 ENCOUNTER — Other Ambulatory Visit: Payer: Self-pay | Admitting: Internal Medicine

## 2019-08-20 MED FILL — OZEMPIC 1 MG/DOSE SOPN: 2 | 28 days supply | Qty: 3 | Fill #0

## 2019-08-24 ENCOUNTER — Encounter (INDEPENDENT_AMBULATORY_CARE_PROVIDER_SITE_OTHER): Payer: Self-pay | Admitting: Family Medicine

## 2019-08-24 ENCOUNTER — Ambulatory Visit (INDEPENDENT_AMBULATORY_CARE_PROVIDER_SITE_OTHER): Payer: 59 | Admitting: Family Medicine

## 2019-08-24 ENCOUNTER — Other Ambulatory Visit: Payer: Self-pay

## 2019-08-24 VITALS — BP 118/80 | HR 85 | Temp 97.9°F | Ht 60.0 in | Wt 184.0 lb

## 2019-08-24 DIAGNOSIS — Z6836 Body mass index (BMI) 36.0-36.9, adult: Secondary | ICD-10-CM | POA: Diagnosis not present

## 2019-08-24 DIAGNOSIS — E119 Type 2 diabetes mellitus without complications: Secondary | ICD-10-CM | POA: Diagnosis not present

## 2019-08-25 ENCOUNTER — Other Ambulatory Visit: Payer: Self-pay | Admitting: Internal Medicine

## 2019-08-25 ENCOUNTER — Ambulatory Visit (INDEPENDENT_AMBULATORY_CARE_PROVIDER_SITE_OTHER): Payer: 59 | Admitting: Psychology

## 2019-08-25 DIAGNOSIS — F3289 Other specified depressive episodes: Secondary | ICD-10-CM | POA: Diagnosis not present

## 2019-08-25 MED FILL — PREGABALIN 150 MG CAPS: 150 | 30 days supply | Qty: 60 | Fill #1

## 2019-08-25 MED FILL — metFORMIN HCL 1000 MG TABS: 1000 | 90 days supply | Qty: 180 | Fill #0

## 2019-08-25 NOTE — Progress Notes (Signed)
Chief Complaint:   OBESITY Michelle Tanner is here to discuss her progress with her obesity treatment plan along with follow-up of her obesity related diagnoses. Michelle Tanner is on the Category 1 Plan with breakfast options and states she is following her eating plan approximately 35% of the time. Michelle Tanner states she is doing 0 minutes 0 times per week.  Today's visit was #: 3 Starting weight: 186 lbs Starting date: 07/23/2019 Today's weight: 184 lbs Today's date: 08/24/2019 Total lbs lost to date: 2 Total lbs lost since last in-office visit: 0  Interim History: Michelle Tanner has done well maintaining her weight since our last visit, even with 3 funerals and increased comfort eating and temptations. She feels she is ready to go back to her Category 1 plan now.  Subjective:   1. Type 2 diabetes mellitus without complication, with long-term current use of insulin (HCC) Michelle Tanner states her BGs range between 66 and 259, mostly 115 in the morning. She has had 2-3 episodes of hypoglycemia which she can feel.  Assessment/Plan:   1. Type 2 diabetes mellitus without complication, with long-term current use of insulin (HCC) Good blood sugar control is important to decrease the likelihood of diabetic complications such as nephropathy, neuropathy, limb loss, blindness, coronary artery disease, and death. Intensive lifestyle modification including diet, exercise and weight loss are the first line of treatment for diabetes. Michelle Tanner will continue her current medications as is, and continue with diet. She was encouraged to keep glucose tablets with her, and she may need to have her insulin decreased if hypoglycemia worsens.  2. Class 2 severe obesity with serious comorbidity and body mass index (BMI) of 36.0 to 36.9 in adult, unspecified obesity type ([place]) Michelle Tanner is currently in the action stage of change. As such, her goal is to continue with weight loss efforts. She has agreed to the Category 1 Plan.    Behavioral modification strategies: emotional eating strategies and dealing with family or coworker sabotage.  Michelle Tanner has agreed to follow-up with our clinic in 2 to 3 weeks. She was informed of the importance of frequent follow-up visits to maximize her success with intensive lifestyle modifications for her multiple health conditions.   Objective:   Blood pressure 118/80, pulse 85, temperature 97.9 F (36.6 C), temperature source Oral, height 5' (1.524 m), weight 184 lb (83.5 kg), last menstrual period 08/06/2019, SpO2 99 %. Body mass index is 35.94 kg/m.  General: Cooperative, alert, well developed, in no acute distress. HEENT: Conjunctivae and lids unremarkable. Cardiovascular: Regular rhythm.  Lungs: Normal work of breathing. Neurologic: No focal deficits.   Lab Results  Component Value Date   CREATININE 0.64 07/23/2019   BUN 8 07/23/2019   NA 135 07/23/2019   K 4.6 07/23/2019   CL 96 07/23/2019   CO2 22 07/23/2019   Lab Results  Component Value Date   ALT 25 07/23/2019   AST 27 07/23/2019   ALKPHOS 138 (H) 07/23/2019   BILITOT <0.2 07/23/2019   Lab Results  Component Value Date   HGBA1C 8.8 (H) 07/16/2019   HGBA1C 9.5 (H) 04/16/2019   HGBA1C 9.2 (H) 03/11/2019   HGBA1C 10.0 (H) 01/07/2019   HGBA1C 10.1 (H) 11/12/2018   No results found for: INSULIN Lab Results  Component Value Date   TSH 1.590 07/23/2019   Lab Results  Component Value Date   CHOL 119 07/23/2019   HDL 50 07/23/2019   LDLCALC 40 07/23/2019   TRIG 182 (H) 07/23/2019   CHOLHDL 2.8 11/12/2018  Lab Results  Component Value Date   WBC 13.3 (H) 06/26/2019   HGB 13.1 06/26/2019   HCT 42.6 06/26/2019   MCV 86.6 06/26/2019   PLT 252 06/26/2019   Lab Results  Component Value Date   IRON 37 (L) 06/26/2019   TIBC 240 06/26/2019   FERRITIN 464 (H) 06/26/2019   Attestation Statements:   Reviewed by clinician on day of visit: allergies, medications, problem list, medical history,  surgical history, family history, social history, and previous encounter notes.  Time spent on visit including pre-visit chart review and post-visit care and charting was 23 minutes.    I, Trixie Dredge, am acting as transcriptionist for Dennard Nip, MD.  I have reviewed the above documentation for accuracy and completeness, and I agree with the above. -  Dennard Nip, MD

## 2019-08-26 NOTE — Progress Notes (Unsigned)
Office: [phone]  /  Fax: [phone]    Date: September 09, 2019   Appointment Start Time: *** Duration: *** minutes Provider: Glennie Isle, Psy.D. Type of Session: Individual Therapy  Location of Patient: {gbptloc:23249} Location of Provider: {Location of Service:22491} Type of Contact: Telepsychological Visit via MyChart Video Visit  Session Content: This provider called Michelle Tanner at 10:32am as she did not present for the telepsychological appointment. A HIPAA compliant voicemail was left requesting a call abck. As such, today's appointment was initiated *** minutes late.  Michelle Tanner is a 49 y.o. female presenting via [place] Visit for a follow-up appointment to address the previously established treatment goal of decreasing emotional eating. Today's appointment was a telepsychological visit due to COVID-19. Tifanie provided verbal consent for today's telepsychological appointment and she is aware she is responsible for securing confidentiality on her end of the session. Prior to proceeding with today's appointment, Arwilda's physical location at the time of this appointment was obtained as well a phone number she could be reached at in the event of technical difficulties. Ailanie and this provider participated in today's telepsychological service.   Tanga acknowledged understanding that for today's appointment and future telepsychological appointments, MyChart will be utilized. She also verbally acknowledged understanding that the information outlined in the telepsychological informed consent form signed at the onset of treatment would still be applicable despite the change in the videoconferencing platform.   This provider conducted a brief check-in and verbally administered the PHQ-9 and GAD-7. *** Patty was receptive to today's appointment as evidenced by openness to sharing, responsiveness to feedback, and {gbreceptiveness:23401}.  Mental Status Examination:    Appearance: {Appearance:22431} Behavior: {Behavior:22445} Mood: {gbmood:21757} Affect: {Affect:22436} Speech: {Speech:22432} Eye Contact: {Eye Contact:22433} Psychomotor Activity: {Motor Activity:22434} Gait: {gbgait:23404} Thought Process: {thought process:22448}  Thought Content/Perception: {disturbances:22451} Orientation: {Orientation:22437} Memory/Concentration: {gbcognition:22449} Insight/Judgment: {Insight:22446}  Structured Assessments Results: The Patient Health Questionnaire-9 (PHQ-9) is a self-report measure that assesses symptoms and severity of depression over the course of the last two weeks. Nathifa obtained a score of *** suggesting {GBPHQ9SEVERITY:21752}. Ambree finds the endorsed symptoms to be {gbphq9difficulty:21754}. [0= Not at all; 1= Several days; 2= More than half the days; 3= Nearly every day] Little interest or pleasure in doing things ***  Feeling down, depressed, or hopeless ***  Trouble falling or staying asleep, or sleeping too much ***  Feeling tired or having little energy ***  Poor appetite or overeating ***  Feeling bad about yourself --- or that you are a failure or have let yourself or your family down ***  Trouble concentrating on things, such as reading the newspaper or watching television ***  Moving or speaking so slowly that other people could have noticed? Or the opposite --- being so fidgety or restless that you have been moving around a lot more than usual ***  Thoughts that you would be better off dead or hurting yourself in some way ***  PHQ-9 Score ***    The Generalized Anxiety Disorder-7 (GAD-7) is a brief self-report measure that assesses symptoms of anxiety over the course of the last two weeks. Quinesha obtained a score of *** suggesting {gbgad7severity:21753}. Takeila finds the endorsed symptoms to be {gbphq9difficulty:21754}. [0= Not at all; 1= Several days; 2= Over half the days; 3= Nearly every day] Feeling nervous,  anxious, on edge ***  Not being able to stop or control worrying ***  Worrying too much about different things ***  Trouble relaxing ***  Being so restless that it's hard to sit still ***  Becoming easily annoyed or irritable ***  Feeling afraid as if something awful might happen ***  GAD-7 Score ***   Interventions:  {Interventions for Progress Notes:23405}  DSM-5 Diagnosis(es): 311 (F32.8) Other Specified Depressive Disorder, Emotional Eating Behaviors  Treatment Goal & Progress: During the initial appointment with this provider, the following treatment goal was established: decrease emotional eating. Terence has demonstrated progress in her goal as evidenced by {gbtxprogress:22839}. [name] also {gbtxprogress2:22951}.  Plan: The next appointment will be scheduled in {gbweeks:21758}, which will be {gbtxmodality:23402}. The next session will focus on {Plan for Next Appointment:23400}.

## 2019-09-02 MED FILL — FREESTYLE LIBRE 14 DAY SENS: 28 days supply | Qty: 2 | Fill #4

## 2019-09-07 ENCOUNTER — Encounter (INDEPENDENT_AMBULATORY_CARE_PROVIDER_SITE_OTHER): Payer: Self-pay | Admitting: Family Medicine

## 2019-09-07 ENCOUNTER — Other Ambulatory Visit: Payer: Self-pay

## 2019-09-07 ENCOUNTER — Ambulatory Visit (INDEPENDENT_AMBULATORY_CARE_PROVIDER_SITE_OTHER): Payer: 59 | Admitting: Family Medicine

## 2019-09-07 VITALS — BP 123/76 | HR 92 | Temp 98.2°F | Ht 60.0 in | Wt 185.0 lb

## 2019-09-07 DIAGNOSIS — F3289 Other specified depressive episodes: Secondary | ICD-10-CM

## 2019-09-07 DIAGNOSIS — Z6836 Body mass index (BMI) 36.0-36.9, adult: Secondary | ICD-10-CM | POA: Diagnosis not present

## 2019-09-08 NOTE — Progress Notes (Signed)
Chief Complaint:   OBESITY Michelle Tanner is here to discuss her progress with her obesity treatment plan along with follow-up of her obesity related diagnoses. Michelle Tanner is on the Category 1 Plan and states she is following her eating plan approximately 70% of the time. Michelle Tanner states she is doing 0 minutes 0 times per week.  Today's visit was #: 4 Starting weight: 186 lbs Starting date: 07/23/2019 Today's weight: 185 lbs Today's date: 09/07/2019 Total lbs lost to date: 1 Total lbs lost since last in-office visit: 0  Interim History: Michelle Tanner has struggled with meal planning especially with increased work and other stressors. She is going on vacation to [place] soon and has questions about travel strategies. She is also getting bored with her lunch options.  Subjective:   1. Other depression, with emotional eating Michelle Tanner notes increased work stress and she is struggling with meal planning, and especially notes increased cravings and emotional eating a few days before her cycle which is unusual.  Assessment/Plan:   1. Other depression, with emotional eating Michelle Tanner was given emotional eating strategies to help her deal with her emotional/non-hunger eating behaviors. She will see her GYN later this week, and will continue to follow up. Orders and follow up as documented in patient record.   2. Class 2 severe obesity with serious comorbidity and body mass index (BMI) of 36.0 to 36.9 in adult, unspecified obesity type (Michelle Tanner) Michelle Tanner is currently in the action stage of change. As such, her goal is to continue with weight loss efforts. She has agreed to the Category 1 Plan with lunch options.   Behavioral modification strategies: increasing lean protein intake.  Michelle Tanner has agreed to follow-up with our clinic in 2 to 3 weeks. She was informed of the importance of frequent follow-up visits to maximize her success with intensive lifestyle modifications for her multiple health  conditions.   Objective:   Blood pressure 123/76, pulse 92, temperature 98.2 F (36.8 C), temperature source Oral, height 5' (1.524 m), weight 185 lb (83.9 kg), last menstrual period 09/02/2019, SpO2 98 %. Body mass index is 36.13 kg/m.  General: Cooperative, alert, well developed, in no acute distress. HEENT: Conjunctivae and lids unremarkable. Cardiovascular: Regular rhythm.  Lungs: Normal work of breathing. Neurologic: No focal deficits.   Lab Results  Component Value Date   CREATININE 0.64 07/23/2019   BUN 8 07/23/2019   NA 135 07/23/2019   K 4.6 07/23/2019   CL 96 07/23/2019   CO2 22 07/23/2019   Lab Results  Component Value Date   ALT 25 07/23/2019   AST 27 07/23/2019   ALKPHOS 138 (H) 07/23/2019   BILITOT <0.2 07/23/2019   Lab Results  Component Value Date   HGBA1C 8.8 (H) 07/16/2019   HGBA1C 9.5 (H) 04/16/2019   HGBA1C 9.2 (H) 03/11/2019   HGBA1C 10.0 (H) 01/07/2019   HGBA1C 10.1 (H) 11/12/2018   No results found for: INSULIN Lab Results  Component Value Date   TSH 1.590 07/23/2019   Lab Results  Component Value Date   CHOL 119 07/23/2019   HDL 50 07/23/2019   LDLCALC 40 07/23/2019   TRIG 182 (H) 07/23/2019   CHOLHDL 2.8 11/12/2018   Lab Results  Component Value Date   WBC 13.3 (H) 06/26/2019   HGB 13.1 06/26/2019   HCT 42.6 06/26/2019   MCV 86.6 06/26/2019   PLT 252 06/26/2019   Lab Results  Component Value Date   IRON 37 (L) 06/26/2019   TIBC 240 06/26/2019  FERRITIN 464 (H) 06/26/2019   Attestation Statements:   Reviewed by clinician on day of visit: allergies, medications, problem list, medical history, surgical history, family history, social history, and previous encounter notes.  Time spent on visit including pre-visit chart review and post-visit care and charting was 31 minutes.    I, Trixie Dredge, am acting as transcriptionist for Dennard Nip, MD.  I have reviewed the above documentation for accuracy and completeness, and  I agree with the above. -  Dennard Nip, MD

## 2019-09-09 ENCOUNTER — Other Ambulatory Visit: Payer: Self-pay

## 2019-09-09 ENCOUNTER — Telehealth (INDEPENDENT_AMBULATORY_CARE_PROVIDER_SITE_OTHER): Payer: 59 | Admitting: Psychology

## 2019-09-09 ENCOUNTER — Other Ambulatory Visit: Payer: Self-pay | Admitting: Internal Medicine

## 2019-09-09 ENCOUNTER — Telehealth (INDEPENDENT_AMBULATORY_CARE_PROVIDER_SITE_OTHER): Payer: Self-pay | Admitting: Psychology

## 2019-09-09 DIAGNOSIS — Z124 Encounter for screening for malignant neoplasm of cervix: Secondary | ICD-10-CM | POA: Diagnosis not present

## 2019-09-09 DIAGNOSIS — N898 Other specified noninflammatory disorders of vagina: Secondary | ICD-10-CM | POA: Diagnosis not present

## 2019-09-09 DIAGNOSIS — Z01419 Encounter for gynecological examination (general) (routine) without abnormal findings: Secondary | ICD-10-CM | POA: Diagnosis not present

## 2019-09-09 LAB — HM PAP SMEAR: HM Pap smear: POSITIVE

## 2019-09-09 MED FILL — CLOTRIMAZOLE-BETAMETHASONE: 1-0.05 | 10 days supply | Qty: 45 | Fill #0

## 2019-09-09 MED FILL — TRESIBA FLEXTOUCH 200 UNITS: 200 | 23 days supply | Qty: 9 | Fill #0

## 2019-09-09 NOTE — Telephone Encounter (Signed)
  Office: [phone]  /  Fax: [phone]  Date of Call: September 09, 2019  Time of Call: 10:32am Provider: Glennie Isle, PsyD  CONTENT: This provider called Jamelia to check-in as she did not present for today's MyChart Video Visit appointment at 10:30am. A HIPAA compliant voicemail was left requesting a call back. Of note, this provider stayed on the MyChart Video Visit appointment for 5 minutes prior to signing off per the clinic's grace period policy.    Per front desk staff, Diore called back after the 5 minute grace period indicating she was having phone issues. She was receptive to rescheduling today's appointment. She was offered a later time today, but indicated she would be unable to attend due to another appointment.   PLAN: Tamana was rescheduled for Sep 22, 2019 at 9:30am via [place] Visit.

## 2019-09-21 ENCOUNTER — Ambulatory Visit (INDEPENDENT_AMBULATORY_CARE_PROVIDER_SITE_OTHER): Payer: 59 | Admitting: Family Medicine

## 2019-09-21 ENCOUNTER — Encounter (INDEPENDENT_AMBULATORY_CARE_PROVIDER_SITE_OTHER): Payer: Self-pay | Admitting: Family Medicine

## 2019-09-21 ENCOUNTER — Other Ambulatory Visit: Payer: Self-pay

## 2019-09-21 VITALS — BP 121/75 | HR 90 | Temp 98.1°F | Ht 60.0 in | Wt 184.0 lb

## 2019-09-21 DIAGNOSIS — E66812 Obesity, class 2: Secondary | ICD-10-CM

## 2019-09-21 DIAGNOSIS — E119 Type 2 diabetes mellitus without complications: Secondary | ICD-10-CM

## 2019-09-21 DIAGNOSIS — Z6835 Body mass index (BMI) 35.0-35.9, adult: Secondary | ICD-10-CM | POA: Diagnosis not present

## 2019-09-21 NOTE — Progress Notes (Signed)
Chief Complaint:   OBESITY Michelle Tanner is here to discuss her progress with her obesity treatment plan along with follow-up of her obesity related diagnoses. Michelle Tanner is on the Category 1 Plan with lunch options and states she is following her eating plan approximately 0% of the time. Michelle Tanner states she is walking for 60 minutes 5 times per week.  Today's visit was #: 5 Starting weight: 186 lbs Starting date: 07/23/2019 Today's weight: 184 lbs Today's date: 09/21/2019 Total lbs lost to date: 2 Total lbs lost since last in-office visit: 1  Interim History: Michelle Tanner recently traveled to [place] and enjoyed restaurants and daily walking. She has been increasing her daily water intake. She recently joined a gym and will begin regular exercise this week. She is also in physical therapy 2 times per week.  Subjective:   1. Type 2 diabetes mellitus without complication, without long-term current use of insulin ([place]) Michelle Tanner's last A1c on 07/16/2019 was 8.8. She reports increase in fasting BGs when traveling and consuming foods that are not on the Category 1 meal plan.  Assessment/Plan:   1. Type 2 diabetes mellitus without complication, without long-term current use of insulin (HCC) Good blood sugar control is important to decrease the likelihood of diabetic complications such as nephropathy, neuropathy, limb loss, blindness, coronary artery disease, and death. Intensive lifestyle modification including diet, exercise and weight loss are the first line of treatment for diabetes. Michelle Tanner will continue her current antidiabetic medications, and will continue to check her BGs at home.  2. Class 2 severe obesity due to excess calories with serious comorbidity and body mass index (BMI) of 35.0 to 35.9 in adult Michelle Tanner) Michelle Tanner is currently in the action stage of change. As such, her goal is to continue with weight loss efforts. She has agreed to the Category 1 Plan and keeping a food journal and  adhering to recommended goals of 250-350 calories and 20+ grams of protein at breakfast daily.    Protein content of food handout was provided today.  Exercise goals: As is.  Behavioral modification strategies: travel eating strategies.  Michelle Tanner has agreed to follow-up with our clinic in 2 weeks. She was informed of the importance of frequent follow-up visits to maximize her success with intensive lifestyle modifications for her multiple health conditions.   Objective:   Blood pressure 121/75, pulse 90, temperature 98.1 F (36.7 C), temperature source Oral, height 5' (1.524 m), weight 184 lb (83.5 kg), last menstrual period 09/02/2019, SpO2 97 %. Body mass index is 35.94 kg/m.  General: Cooperative, alert, well developed, in no acute distress. HEENT: Conjunctivae and lids unremarkable. Cardiovascular: Regular rhythm.  Lungs: Normal work of breathing. Neurologic: No focal deficits.   Lab Results  Component Value Date   CREATININE 0.64 07/23/2019   BUN 8 07/23/2019   NA 135 07/23/2019   K 4.6 07/23/2019   CL 96 07/23/2019   CO2 22 07/23/2019   Lab Results  Component Value Date   ALT 25 07/23/2019   AST 27 07/23/2019   ALKPHOS 138 (H) 07/23/2019   BILITOT <0.2 07/23/2019   Lab Results  Component Value Date   HGBA1C 8.8 (H) 07/16/2019   HGBA1C 9.5 (H) 04/16/2019   HGBA1C 9.2 (H) 03/11/2019   HGBA1C 10.0 (H) 01/07/2019   HGBA1C 10.1 (H) 11/12/2018   No results found for: INSULIN Lab Results  Component Value Date   TSH 1.590 07/23/2019   Lab Results  Component Value Date   CHOL 119 07/23/2019  HDL 50 07/23/2019   LDLCALC 40 07/23/2019   TRIG 182 (H) 07/23/2019   CHOLHDL 2.8 11/12/2018   Lab Results  Component Value Date   WBC 13.3 (H) 06/26/2019   HGB 13.1 06/26/2019   HCT 42.6 06/26/2019   MCV 86.6 06/26/2019   PLT 252 06/26/2019   Lab Results  Component Value Date   IRON 37 (L) 06/26/2019   TIBC 240 06/26/2019   FERRITIN 464 (H) 06/26/2019    Attestation Statements:   Reviewed by clinician on day of visit: allergies, medications, problem list, medical history, surgical history, family history, social history, and previous encounter notes.  Time spent on visit including pre-visit chart review and post-visit care and charting was 23 minutes.    I, Trixie Dredge, am acting as transcriptionist for Dennard Nip, MD.  I have reviewed the above documentation for accuracy and completeness, and I agree with the above. -  Dennard Nip, MD

## 2019-09-22 ENCOUNTER — Telehealth (INDEPENDENT_AMBULATORY_CARE_PROVIDER_SITE_OTHER): Payer: 59 | Admitting: Psychology

## 2019-09-22 DIAGNOSIS — F3289 Other specified depressive episodes: Secondary | ICD-10-CM | POA: Diagnosis not present
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
## 2019-09-22 NOTE — Progress Notes (Signed)
  Office: [phone]  /  Fax: [phone]    Date: Sep 22, 2019    Appointment Start Time: 9:33am Duration: 21 minutes Provider: Glennie Isle, Psy.D. Type of Session: Individual Therapy  Location of Patient: Home Location of Provider: Provider's Home Type of Contact: Telepsychological Visit via MyChart Video Visit  Session Content: Michelle Tanner is a 49 y.o. female presenting via MyChart Video Visit for a follow-up appointment to address the previously established treatment goal of decreasing emotional eating. Today's appointment was a telepsychological visit due to COVID-19. Michelle Tanner provided verbal consent for today's telepsychological appointment and she is aware she is responsible for securing confidentiality on her end of the session. Prior to proceeding with today's appointment, Michelle Tanner's physical location at the time of this appointment was obtained as well a phone number she could be reached at in the event of technical difficulties. Michelle Tanner and this provider participated in today's telepsychological service.   Michelle Tanner acknowledged understanding that for today's appointment and future telepsychological appointments, MyChart Video Visit will be utilized. She also verbally acknowledged understanding that the information outlined in the telepsychological informed consent form signed at the onset of treatment would still be applicable despite the change in the videoconferencing platform.   This provider conducted a brief check-in. Michelle Tanner shared about recent events. Triggers for emotional eating were reviewed. Psychoeducation regarding mindfulness was provided. A handout was provided to Michelle Tanner with further information regarding mindfulness, including exercises. This provider also explained the benefit of mindfulness as it relates to emotional eating. Michelle Tanner was encouraged to engage in the provided exercises between now and the next appointment with this provider. Michelle Tanner agreed.  During today's appointment, Michelle Tanner was led through a mindfulness exercise involving her senses. Michelle Tanner provided verbal consent during today's appointment for this provider to send a handout about mindfulness via e-mail. Michelle Tanner was receptive to today's appointment as evidenced by openness to sharing, responsiveness to feedback, and willingness to engage in mindfulness exercises to assist with coping.  Mental Status Examination:  Appearance: well groomed and appropriate hygiene  Behavior: appropriate to circumstances Mood: euthymic Affect: mood congruent Speech: normal in rate, volume, and tone Eye Contact: appropriate Psychomotor Activity: appropriate Gait: unable to assess Thought Process: linear, logical, and goal directed  Thought Content/Perception: no hallucinations, delusions, bizarre thinking or behavior reported or observed and no evidence of suicidal and homicidal ideation, plan, and intent Orientation: time, person, place and purpose of appointment Memory/Concentration: memory, attention, language, and fund of knowledge intact  Insight/Judgment: good  Interventions:  Conducted a brief chart review Provided empathic reflections and validation Reviewed content from the previous session Employed supportive psychotherapy interventions to facilitate reduced distress and to improve coping skills with identified stressors Employed motivational interviewing skills to assess patient's willingness/desire to adhere to recommended medical treatments and assignments Psychoeducation provided regarding mindfulness Engaged patient in mindfulness exercise(s) Employed acceptance and commitment interventions to emphasize mindfulness and acceptance without struggle  DSM-5 Diagnosis(es): 311 (F32.8) Other Specified Depressive Disorder, Emotional Eating Behaviors  Treatment Goal & Progress: During the initial appointment with this provider, the following treatment goal was established:  decrease emotional eating. Michelle Tanner has demonstrated progress in her goal as evidenced by increased awareness of hunger patterns and increased awareness of triggers for emotional eating. Michelle Tanner also demonstrates willingness to engage in mindfulness exercises.  Plan: The next appointment will be scheduled in two weeks, which will be via MyChart Video Visit. The next session will focus on working towards the established treatment goal.

## 2019-09-24 MED FILL — PREGABALIN 150 MG CAPS: 150 | 30 days supply | Qty: 60 | Fill #2

## 2019-10-02 ENCOUNTER — Other Ambulatory Visit: Payer: Self-pay

## 2019-10-02 ENCOUNTER — Other Ambulatory Visit (HOSPITAL_COMMUNITY): Payer: Self-pay | Admitting: Internal Medicine

## 2019-10-02 MED ORDER — UNIFINE PENTIPS PLUS 31G X 5 MM MISC: 2 refills | Status: DC

## 2019-10-02 MED ORDER — OZEMPIC (1 MG/DOSE) 2 MG/1.5ML ~~LOC~~ SOPN: 1.0000 mg | PEN_INJECTOR | SUBCUTANEOUS | 1 refills | Status: DC

## 2019-10-05 ENCOUNTER — Ambulatory Visit (INDEPENDENT_AMBULATORY_CARE_PROVIDER_SITE_OTHER): Payer: 59 | Admitting: Family Medicine

## 2019-10-05 ENCOUNTER — Other Ambulatory Visit: Payer: Self-pay

## 2019-10-05 ENCOUNTER — Encounter (INDEPENDENT_AMBULATORY_CARE_PROVIDER_SITE_OTHER): Payer: Self-pay | Admitting: Family Medicine

## 2019-10-05 ENCOUNTER — Telehealth (INDEPENDENT_AMBULATORY_CARE_PROVIDER_SITE_OTHER): Payer: 59 | Admitting: Psychology

## 2019-10-05 VITALS — BP 136/80 | HR 85 | Temp 98.2°F | Ht 60.0 in | Wt 185.0 lb

## 2019-10-05 DIAGNOSIS — Z6836 Body mass index (BMI) 36.0-36.9, adult: Secondary | ICD-10-CM

## 2019-10-05 DIAGNOSIS — F3289 Other specified depressive episodes: Secondary | ICD-10-CM

## 2019-10-05 DIAGNOSIS — E559 Vitamin D deficiency, unspecified: Secondary | ICD-10-CM | POA: Diagnosis not present

## 2019-10-05 DIAGNOSIS — E119 Type 2 diabetes mellitus without complications: Secondary | ICD-10-CM

## 2019-10-05 NOTE — Progress Notes (Signed)
Chief Complaint:   OBESITY Danesia is here to discuss her progress with her obesity treatment plan along with follow-up of her obesity related diagnoses. Eleesha is on the Category 1 Plan and keeping a food journal and adhering to recommended goals of 250-350 calories and 20+ grams of protein at breakfast daily and states she is following her eating plan approximately 0% of the time. Maite states she is doing 0 minutes 0 times per week.  Today's visit was #: 6 Starting weight: 186 lbs Starting date: 07/23/2019 Today's weight: 185 lbs Today's date: 10/05/2019 Total lbs lost to date: 1 Total lbs lost since last in-office visit: 0  Interim History: Miyani notes her work has been providing 2 meals per day, foods that are high in saturated fat, sugar, and carbohydrates. Her hectic work schedule has prevented her from meal prepping and planning.  Subjective:   1. Type 2 diabetes mellitus without complication, without long-term current use of insulin (HCC) Brindle's A1c on 07/16/2019 was 8.8. She notes her home fasting BGs range between 100's and 170's, and home post prandial BGs are as high as 400+. She is on metformin 1,000 mg BID, and Ozempic 1 mg once weekly, and is tolerating it well.  2. Vitamin D deficiency Fredrica's Vit D level on 07/23/2019 was 42.7. She is on prescription strength Vit D supplementation, and is tolerating it well.  Assessment/Plan:   1. Type 2 diabetes mellitus without complication, without long-term current use of insulin ([place]) Zea will continue her current anti-diabetic regimen, and will reduce carbohydrate and sugar intake. Good blood sugar control is important to decrease the likelihood of diabetic complications such as nephropathy, neuropathy, limb loss, blindness, coronary artery disease, and death. Intensive lifestyle modification including diet, exercise and weight loss are the first line of treatment for diabetes. We will check labs at her  next office visit.  - Comprehensive metabolic panel - CBC with Differential/Platelet - Hemoglobin A1c - Insulin, random - Lipid Panel With LDL/HDL Ratio - TSH - T4, free  2. Vitamin D deficiency Low Vitamin D level contributes to fatigue and are associated with obesity, breast, and colon cancer. Anitha agreed to continue taking prescription Vitamin D 50,000 IU every week and will follow-up for routine testing of Vitamin D, at least 2-3 times per year to avoid over-replacement. We will recheck labs at her next office visit.  - VITAMIN D 25 Hydroxy (Vit-D Deficiency, Fractures)  3. Class 2 severe obesity with serious comorbidity and body mass index (BMI) of 36.0 to 36.9 in adult, unspecified obesity type ([place]) Daley is currently in the action stage of change. As such, her goal is to continue with weight loss efforts. She has agreed to keeping a food journal and adhering to recommended goals of 1000-1100 calories and 75+ grams of protein daily.   Laterria is to change to journaling for the entire day. She is to use MyFitness Pal  To track calories and protein. I recommended providing a list of healthier meals to co-workers, so they can order foods that will help her stay on the plan.  Exercise goals: No exercise has been prescribed at this time.  Behavioral modification strategies: increasing lean protein intake, decreasing simple carbohydrates, decreasing eating out, meal planning and cooking strategies, dealing with family or coworker sabotage and keeping a strict food journal.  Abiha has agreed to follow-up with our clinic in 3 weeks. She was informed of the importance of frequent follow-up visits to maximize her success with  intensive lifestyle modifications for her multiple health conditions.   Objective:   Blood pressure 136/80, pulse 85, temperature 98.2 F (36.8 C), temperature source Oral, height 5' (1.524 m), weight 185 lb (83.9 kg), SpO2 97 %. Body mass index is  36.13 kg/m.  General: Cooperative, alert, well developed, in no acute distress. HEENT: Conjunctivae and lids unremarkable. Cardiovascular: Regular rhythm.  Lungs: Normal work of breathing. Neurologic: No focal deficits.   Lab Results  Component Value Date   CREATININE 0.64 07/23/2019   BUN 8 07/23/2019   NA 135 07/23/2019   K 4.6 07/23/2019   CL 96 07/23/2019   CO2 22 07/23/2019   Lab Results  Component Value Date   ALT 25 07/23/2019   AST 27 07/23/2019   ALKPHOS 138 (H) 07/23/2019   BILITOT <0.2 07/23/2019   Lab Results  Component Value Date   HGBA1C 8.8 (H) 07/16/2019   HGBA1C 9.5 (H) 04/16/2019   HGBA1C 9.2 (H) 03/11/2019   HGBA1C 10.0 (H) 01/07/2019   HGBA1C 10.1 (H) 11/12/2018   No results found for: INSULIN Lab Results  Component Value Date   TSH 1.590 07/23/2019   Lab Results  Component Value Date   CHOL 119 07/23/2019   HDL 50 07/23/2019   LDLCALC 40 07/23/2019   TRIG 182 (H) 07/23/2019   CHOLHDL 2.8 11/12/2018   Lab Results  Component Value Date   WBC 13.3 (H) 06/26/2019   HGB 13.1 06/26/2019   HCT 42.6 06/26/2019   MCV 86.6 06/26/2019   PLT 252 06/26/2019   Lab Results  Component Value Date   IRON 37 (L) 06/26/2019   TIBC 240 06/26/2019   FERRITIN 464 (H) 06/26/2019   Attestation Statements:   Reviewed by clinician on day of visit: allergies, medications, problem list, medical history, surgical history, family history, social history, and previous encounter notes.  Time spent on visit including pre-visit chart review and post-visit care and charting was 27 minutes.    I, Trixie Dredge, am acting as transcriptionist for Dennard Nip, MD.  I have reviewed the above documentation for accuracy and completeness, and I agree with the above. -  Dennard Nip, MD

## 2019-10-06 ENCOUNTER — Ambulatory Visit: Payer: 59 | Admitting: Sports Medicine

## 2019-10-07 NOTE — Progress Notes (Signed)
  Office: [phone]  /  Fax: [phone]    Date: October 21, 2019   Appointment Start Time: 2:08pm Duration: 24 minutes Provider: Glennie Isle, Psy.D. Type of Session: Individual Therapy  Location of Patient: Home Location of Provider: Provider's Home Type of Contact: Telepsychological Visit via MyChart Video Visit  Session Content: This provider called Lashonta at 2:02pm as she did not present for the telepsychological appointment. A HIPAA compliant voicemail was left requesting a call back. She called back around 2:04pm, noting she thought it was a Financial risk analyst. As such, today's appointment was initiated 8 minutes late. Michelle Tanner is a 49 y.o. female presenting via [place] Visit for a follow-up appointment to address the previously established treatment goal of decreasing emotional eating. Today's appointment was a telepsychological visit due to COVID-19. [name] provided verbal consent for today's telepsychological appointment and she is aware she is responsible for securing confidentiality on her end of the session. Prior to proceeding with today's appointment, [name]'s physical location at the time of this appointment was obtained as well a phone number she could be reached at in the event of technical difficulties. [name] and this provider participated in today's telepsychological service.   This provider conducted a brief check-in. [name] reported she has been busy with work, adding "I'm pretty good right now." She noted her uncle still resides down the street; however, she denied current safety concerns. [name] reported she plans to call [facility] today to schedule an appointment. Regarding eating, she noted she has not been following her meal plan due to long work days. [name] reported a plan to reduce her work schedule. Session also focused on strategies (e.g., making boiled eggs ahead of time, taking snacks for the week to work, having a working  plan for dinner) to hep her plan ahead and eat congruent to her meal plan. [name] was receptive to today's appointment as evidenced by openness to sharing, responsiveness to feedback, and willingness to implement discussed strategies .   Mental Status Examination:  Appearance: well groomed and appropriate hygiene  Behavior: appropriate to circumstances Mood: euthymic Affect: mood congruent Speech: normal in rate, volume, and tone Eye Contact: appropriate Psychomotor Activity: appropriate Gait: unable to assess Thought Process: linear, logical, and goal directed  Thought Content/Perception: no hallucinations, delusions, bizarre thinking or behavior reported or observed and no evidence of suicidal and homicidal ideation, plan, and intent Orientation: time, person, place and purpose of appointment Memory/Concentration: memory, attention, language, and fund of knowledge intact  Insight/Judgment: good  Interventions:  Conducted a brief chart review Provided empathic reflections and validation Employed supportive psychotherapy interventions to facilitate reduced distress and to improve coping skills with identified stressors Employed motivational interviewing skills to assess patient's willingness/desire to adhere to recommended medical treatments and assignments Engaged patient in problem solving  DSM-5 Diagnosis(es): 311 (F32.8) Other Specified Depressive Disorder, Emotional Eating Behaviors  Treatment Goal & Progress: During the initial appointment with this provider, the following treatment goal was established: decrease emotional eating. [name] has demonstrated progress in her goal as evidenced by increased awareness of hunger patterns and increased awareness of triggers for emotional eating. [name] also continues to demonstrate willingness to engage in learned skill(s).  Plan: The next appointment will be scheduled in three weeks, which will be via MyChart Video Visit. The next  session will focus on working towards the established treatment goal.

## 2019-10-13 ENCOUNTER — Other Ambulatory Visit: Payer: Self-pay

## 2019-10-13 ENCOUNTER — Encounter: Payer: Self-pay | Admitting: Internal Medicine

## 2019-10-13 ENCOUNTER — Ambulatory Visit (INDEPENDENT_AMBULATORY_CARE_PROVIDER_SITE_OTHER): Payer: 59 | Admitting: Internal Medicine

## 2019-10-13 VITALS — BP 122/78 | HR 98 | Temp 98.0°F | Ht 61.0 in | Wt 188.8 lb

## 2019-10-13 DIAGNOSIS — R609 Edema, unspecified: Secondary | ICD-10-CM

## 2019-10-13 DIAGNOSIS — E1141 Type 2 diabetes mellitus with diabetic mononeuropathy: Secondary | ICD-10-CM

## 2019-10-13 DIAGNOSIS — E559 Vitamin D deficiency, unspecified: Secondary | ICD-10-CM

## 2019-10-13 DIAGNOSIS — G8929 Other chronic pain: Secondary | ICD-10-CM | POA: Diagnosis not present

## 2019-10-13 DIAGNOSIS — Z6835 Body mass index (BMI) 35.0-35.9, adult: Secondary | ICD-10-CM | POA: Diagnosis not present

## 2019-10-13 DIAGNOSIS — M25511 Pain in right shoulder: Secondary | ICD-10-CM | POA: Diagnosis not present

## 2019-10-13 MED ORDER — SUMATRIPTAN SUCCINATE 50 MG PO TABS: 50.0000 mg | ORAL_TABLET | ORAL | 2 refills | Status: AC | PRN

## 2019-10-13 MED FILL — SUMAtriptan SUCCINATE 50 MG: 50 | 30 days supply | Qty: 10 | Fill #0

## 2019-10-13 NOTE — Patient Instructions (Signed)
Baker Cyst ° °A Baker cyst, also called a popliteal cyst, is a growth that forms at the back of the knee. The cyst forms when the fluid-filled sac (bursa) that cushions the knee joint becomes enlarged. °What are the causes? °In most cases, a Baker cyst results from another knee problem that causes swelling inside the knee. This makes the fluid inside the knee joint (synovial fluid) flow into the bursa behind the knee, causing the bursa to enlarge. °What increases the risk? °You may be more likely to develop a Baker cyst if you already have a knee problem, such as: °· A tear in cartilage that cushions the knee joint (meniscal tear). °· A tear in the tissues that connect the bones of the knee joint (ligament tear). °· Knee swelling from osteoarthritis, rheumatoid arthritis, or gout. °What are the signs or symptoms? °The main symptom of this condition is a lump behind the knee. This may be the only symptom of the condition. The lump may be painful, especially when the knee is straightened. If the lump is painful, the pain may come and go. The knee may also be stiff. °Symptoms may quickly get more severe if the cyst breaks open (ruptures). If the cyst ruptures, you may feel the following in your knee and calf: °· Sudden or worsening pain. °· Swelling. °· Bruising. °· Redness in the calf. °A Baker cyst does not always cause symptoms. °How is this diagnosed? °This condition may be diagnosed based on your symptoms and medical history. Your health care provider will also do a physical exam. This may include: °· Feeling the cyst to check whether it is tender. °· Checking your knee for signs of another knee condition that causes swelling. °You may have imaging tests, such as: °· X-rays. °· MRI. °· Ultrasound. °How is this treated? °A Baker cyst that is not painful may go away without treatment. If the cyst gets large or painful, it will likely get better if the underlying knee problem is treated. °If needed, treatment for a  Baker cyst may include: °· Resting. °· Keeping weight off of the knee. This means not leaning on the knee to support your body weight. °· Taking NSAIDs, such as ibuprofen, to reduce pain and swelling. °· Having a procedure to drain the fluid from the cyst with a needle (aspiration). You may also get an injection of a medicine that reduces swelling (steroid). °· Having surgery. This may be needed if other treatments do not work. This usually involves correcting knee damage and removing the cyst. °Follow these instructions at home: ° °Activity °· Rest as told by your health care provider. °· Avoid activities that make pain or swelling worse. °· Return to your normal activities as told by your health care provider. Ask your health care provider what activities are safe for you. °· Do not use the injured limb to support your body weight until your health care provider says that you can. Use crutches as told by your health care provider. °General instructions °· Take over-the-counter and prescription medicines only as told by your health care provider. °· Keep all follow-up visits as told by your health care provider. This is important. °Contact a health care provider if: °· You have knee pain, stiffness, or swelling that does not get better. °Get help right away if: °· You have sudden or worsening pain and swelling in your calf area. °Summary °· A Baker cyst, also called a popliteal cyst, is a growth that forms at the   back of the knee. °· In most cases, a Baker cyst results from another knee problem that causes swelling inside the knee. °· A Baker cyst that is not painful may go away without treatment. °· If needed, treatment for a Baker cyst may include resting, keeping weight off of the knee, medicines, or draining fluid from the cyst. °· Surgery may be needed if other treatments are not effective. °This information is not intended to replace advice given to you by your health care provider. Make sure you discuss any  questions you have with your health care provider. °Document Revised: 09/19/2018 Document Reviewed: 09/19/2018 °[employer]vier Patient Education © 2020 [employer] ° ° ° °

## 2019-10-13 NOTE — Progress Notes (Signed)
This visit occurred during the SARS-CoV-2 public health emergency.  Safety protocols were in place, including screening questions prior to the visit, additional usage of staff PPE, and extensive cleaning of exam room while observing appropriate contact time as indicated for disinfecting solutions.  Subjective:     Patient ID: Michelle Tanner , female    DOB: [birth year] , 49 y.o.   MRN: [id number]   Chief Complaint  Patient presents with  . Diabetes  . Vitamin d f/u    HPI  She presents today for dm check. She reports compliance with meds. She adds that she has cut back on her work schedule somewhat.   Diabetes She presents for her follow-up diabetic visit. She has type 2 diabetes mellitus. Her disease course has been improving. There are no hypoglycemic associated symptoms. Pertinent negatives for diabetes include no blurred vision and no chest pain. There are no hypoglycemic complications. Risk factors for coronary artery disease include diabetes mellitus, dyslipidemia and hypertension. She never participates in exercise. Her home blood glucose trend is decreasing steadily. An ACE inhibitor/angiotensin II receptor blocker is not being taken.     Past Medical History:  Diagnosis Date  . Back pain   . Diabetes mellitus   . Edema, lower extremity   . Fatty liver    per patient   . Fibromyalgia   . Frozen shoulder    left and right   . Hx of migraine headaches   . Joint pain   . Migraines   . Neuromuscular disorder ([place])    diabetic neuropathy  . Shoulder pain      Family History  Problem Relation Age of Onset  . Rectal cancer Paternal Grandmother   . Diabetes Father   . Kidney disease Father   . CAD Father   . Hyperlipidemia Father   . Hypertension Father   . Heart disease Father   . Sudden death Father   . Cancer Mother   . Cancer Paternal Uncle   . Cancer Paternal Aunt      Current Outpatient Medications:  .  acetaminophen (TYLENOL) 650 MG CR tablet, Take  650 mg by mouth every 8 (eight) hours as needed for pain., Disp: , Rfl:  .  clotrimazole (GYNE-LOTRIMIN 3) 2 % vaginal cream, Gyne-Lotrimin 2 % vaginal cream  Insert 1 applicatorful by vaginal route for 7 days., Disp: , Rfl:  .  dapagliflozin propanediol (FARXIGA) 10 MG TABS tablet, Take 10 mg by mouth daily., Disp: 90 tablet, Rfl: 2 .  diclofenac Sodium (VOLTAREN) 1 % GEL, Apply topically as needed., Disp: , Rfl:  .  folic acid (FOLVITE) 1 MG tablet, Take 1 tablet (1 mg total) by mouth daily., Disp: 90 tablet, Rfl: 1 .  ibuprofen (ADVIL,MOTRIN) 800 MG tablet, Take 800 mg by mouth every 8 (eight) hours as needed. For pain, Disp: , Rfl:  .  Iron-FA-B Cmp-C-Biot-Probiotic (FUSION PLUS) CAPS, Take 1 capsule by mouth daily. 1 capsule daily, Disp: 90 capsule, Rfl: 1 .  linaclotide (LINZESS) 145 MCG CAPS capsule, Take 145 mcg by mouth daily before breakfast., Disp: , Rfl:  .  metFORMIN (GLUCOPHAGE) 1000 MG tablet, TAKE 1 TABLET BY MOUTH TWO TIMES A DAY WITH MORNING AND EVENING MEALS, Disp: 180 tablet, Rfl: 0 .  Multiple Vitamin (MULITIVITAMIN WITH MINERALS) TABS, Take 1 tablet by mouth daily., Disp: , Rfl:  .  pregabalin (LYRICA) 150 MG capsule, Take 1 capsule (150 mg total) by mouth 2 (two) times daily., Disp: 60 capsule, Rfl:  2 .  Semaglutide, 1 MG/DOSE, (OZEMPIC, 1 MG/DOSE,) 2 MG/1.5ML SOPN, Inject 1 mg into the skin once a week., Disp: 9 pen, Rfl: 1 .  SUMAtriptan (IMITREX) 50 MG tablet, Take 1 tablet (50 mg total) by mouth every 2 (two) hours as needed., Disp: 10 tablet, Rfl: 2 .  traMADol (ULTRAM) 50 MG tablet, Take 1 tablet (50 mg total) by mouth every 6 (six) hours as needed., Disp: 30 tablet, Rfl: 0 .  TRESIBA FLEXTOUCH 200 UNIT/ML FlexTouch Pen, INJECT 30 UNITS UNDER THE SKIN AT BEDTIME (MAX DOSE IS 80 UNITS) (Patient taking differently: 28 Units. ), Disp: 9 mL, Rfl: 0 .  UNIFINE PENTIPS PLUS 31G X 5 MM MISC, Use as directed with Ozempic pen, Disp: 90 each, Rfl: 2 .  valACYclovir (VALTREX) 500 MG  tablet, Take 500 mg by mouth 3 (three) times daily., Disp: , Rfl: 0   Allergies  Allergen Reactions  . Latex Hives     Review of Systems  Constitutional: Negative.   Eyes: Negative for blurred vision.  Respiratory: Negative.   Cardiovascular: Negative.  Negative for chest pain.  Gastrointestinal: Negative.   Musculoskeletal: Positive for arthralgias.       She c/o r shoulder pain. There is pain with movement. Denies fall/trauma. Also with pain/swelling behind left knee. Denies fall/trauma. In PT now. Feels as if something is pulling behind her knee.   Neurological: Negative.   Psychiatric/Behavioral: Negative.      Today's Vitals   10/13/19 1015  BP: 122/78  Pulse: 98  Temp: 98 F (36.7 C)  TempSrc: Oral  Weight: 188 lb 12.8 oz (85.6 kg)  Height: 5\' 1"  (1.549 m)   Body mass index is 35.67 kg/m.   Objective:  Physical Exam Vitals and nursing note reviewed.  Constitutional:      Appearance: Normal appearance.  HENT:     Head: Normocephalic and atraumatic.  Cardiovascular:     Rate and Rhythm: Normal rate and regular rhythm.     Heart sounds: Normal heart sounds.  Pulmonary:     Effort: Pulmonary effort is normal.     Breath sounds: Normal breath sounds.  Musculoskeletal:        General: Tenderness present.     Comments: There is pain to palpation of right shoulder, decreased ROM due to pain.   There is swelling in posterior of left knee. No overlying erythema. Pain with movement.   Skin:    General: Skin is warm.  Neurological:     General: No focal deficit present.     Mental Status: She is alert.  Psychiatric:        Mood and Affect: Mood normal.        Behavior: Behavior normal.         Assessment And Plan:     1. Diabetic mononeuropathy associated with type 2 diabetes mellitus ([place])  Chronic, she does not wish to have her blood drawn today. She promises to get drawn at work. I will make further recommendations once her labs are available for review.    - Hemoglobin A1c; Future  2. Vitamin D deficiency disease  Vitamin D levels will be drawn by [facility]. I will defer treatment until these results are available. Pt advised she will likely need to take at least 2000 units vit d3 capsules daily.   3. Chronic right shoulder pain  I will refer her to Ortho for further evaluation. She is currently in PT in [place].   - Ambulatory  referral to Orthopedic Surgery  4. Swelling  I will schedule her for u/s to evaluate for likely Baker's cyst if her sx persist. Applying ice to affected area and taking oral anti-inflammatories may be helpful. Sometimes these lesions require drainage by Ortho. She is also encouraged to discuss during her Ortho visit.  5. Class 2 severe obesity due to excess calories with serious comorbidity and body mass index (BMI) of 35.0 to 35.9 in adult [facility])  She is encouraged to strive for BMI less than 30 to decrease cardiac risk. Importance of regular exercise was discussed with the patient.   [name], MD    THE PATIENT IS ENCOURAGED TO PRACTICE SOCIAL DISTANCING DUE TO THE COVID-19 PANDEMIC.

## 2019-10-14 ENCOUNTER — Encounter (INDEPENDENT_AMBULATORY_CARE_PROVIDER_SITE_OTHER): Payer: Self-pay

## 2019-10-14 MED FILL — UNIFINE PENTIPS 31GX3/16: 31G X 5 MM | 90 days supply | Qty: 100 | Fill #0

## 2019-10-15 ENCOUNTER — Other Ambulatory Visit: Payer: Self-pay | Admitting: Internal Medicine

## 2019-10-15 DIAGNOSIS — L720 Epidermal cyst: Secondary | ICD-10-CM | POA: Diagnosis not present

## 2019-10-15 DIAGNOSIS — L0501 Pilonidal cyst with abscess: Secondary | ICD-10-CM | POA: Diagnosis not present

## 2019-10-15 MED ORDER — MELOXICAM 7.5 MG PO TABS: ORAL_TABLET | ORAL | 0 refills | Status: AC

## 2019-10-16 MED FILL — MELOXICAM 7.5 MG TABLET: 7.5 | 30 days supply | Qty: 30 | Fill #0

## 2019-10-20 ENCOUNTER — Other Ambulatory Visit: Payer: Self-pay | Admitting: General Surgery

## 2019-10-20 DIAGNOSIS — R102 Pelvic and perineal pain: Secondary | ICD-10-CM

## 2019-10-21 ENCOUNTER — Other Ambulatory Visit: Payer: Self-pay

## 2019-10-21 ENCOUNTER — Telehealth (INDEPENDENT_AMBULATORY_CARE_PROVIDER_SITE_OTHER): Payer: 59 | Admitting: Psychology

## 2019-10-21 DIAGNOSIS — F3289 Other specified depressive episodes: Secondary | ICD-10-CM

## 2019-10-23 ENCOUNTER — Other Ambulatory Visit
Admission: RE | Admit: 2019-10-23 | Discharge: 2019-10-23 | Disposition: A | Discharge status: Home / Self Care | Payer: 59 | Source: Ambulatory Visit | Attending: Family Medicine | Admitting: Family Medicine

## 2019-10-23 ENCOUNTER — Other Ambulatory Visit: Payer: Self-pay

## 2019-10-23 ENCOUNTER — Inpatient Hospital Stay: Payer: 59 | Attending: Family

## 2019-10-23 ENCOUNTER — Other Ambulatory Visit: Payer: Self-pay | Admitting: Internal Medicine

## 2019-10-23 ENCOUNTER — Inpatient Hospital Stay (HOSPITAL_BASED_OUTPATIENT_CLINIC_OR_DEPARTMENT_OTHER): Payer: 59 | Admitting: Family

## 2019-10-23 ENCOUNTER — Other Ambulatory Visit
Admission: RE | Admit: 2019-10-23 | Discharge: 2019-10-23 | Disposition: A | Discharge status: Home / Self Care | Payer: 59 | Source: Ambulatory Visit | Attending: Internal Medicine | Admitting: Internal Medicine

## 2019-10-23 VITALS — BP 124/68 | HR 82 | Temp 96.9°F | Resp 18 | Wt 191.2 lb

## 2019-10-23 DIAGNOSIS — G629 Polyneuropathy, unspecified: Secondary | ICD-10-CM | POA: Insufficient documentation

## 2019-10-23 DIAGNOSIS — D509 Iron deficiency anemia, unspecified: Secondary | ICD-10-CM | POA: Insufficient documentation

## 2019-10-23 DIAGNOSIS — E559 Vitamin D deficiency, unspecified: Secondary | ICD-10-CM | POA: Diagnosis not present

## 2019-10-23 DIAGNOSIS — Z79899 Other long term (current) drug therapy: Secondary | ICD-10-CM | POA: Insufficient documentation

## 2019-10-23 DIAGNOSIS — D560 Alpha thalassemia: Secondary | ICD-10-CM | POA: Diagnosis not present

## 2019-10-23 DIAGNOSIS — W57XXXA Bitten or stung by nonvenomous insect and other nonvenomous arthropods, initial encounter: Secondary | ICD-10-CM | POA: Diagnosis not present

## 2019-10-23 DIAGNOSIS — E119 Type 2 diabetes mellitus without complications: Secondary | ICD-10-CM | POA: Insufficient documentation

## 2019-10-23 DIAGNOSIS — D500 Iron deficiency anemia secondary to blood loss (chronic): Secondary | ICD-10-CM

## 2019-10-23 LAB — COMPREHENSIVE METABOLIC PANEL
ALT: 20 U/L (ref 0–44)
AST: 23 U/L (ref 15–41)
Albumin: 3.8 g/dL (ref 3.5–5.0)
Alkaline Phosphatase: 102 U/L (ref 38–126)
Anion gap: 11 (ref 5–15)
BUN: 12 mg/dL (ref 6–20)
CO2: 27 mmol/L (ref 22–32)
Calcium: 9.7 mg/dL (ref 8.9–10.3)
Chloride: 99 mmol/L (ref 98–111)
Creatinine, Ser: 0.63 mg/dL (ref 0.44–1.00)
GFR calc Af Amer: >60 mL/min (ref >60)
GFR calc non Af Amer: >60 mL/min (ref >60)
Glucose, Bld: 137 mg/dL — ABNORMAL HIGH (ref 70–99)
Potassium: 3.8 mmol/L (ref 3.5–5.1)
Sodium: 137 mmol/L (ref 135–145)
Total Bilirubin: 0.4 mg/dL (ref 0.3–1.2)
Total Protein: 8.1 g/dL (ref 6.5–8.1)

## 2019-10-23 LAB — CBC WITH DIFFERENTIAL (CANCER CENTER ONLY)
Abs Immature Granulocytes: 0.07 10*3/uL (ref 0.00–0.07)
Basophils Absolute: 0.1 10*3/uL (ref 0.0–0.1)
Basophils Relative: 1 %
Eosinophils Absolute: 0.4 10*3/uL (ref 0.0–0.5)
Eosinophils Relative: 3 %
HCT: 40.7 % (ref 36.0–46.0)
Hemoglobin: 12.7 g/dL (ref 12.0–15.0)
Immature Granulocytes: 1 %
Lymphocytes Relative: 20 %
Lymphs Abs: 3 10*3/uL (ref 0.7–4.0)
MCH: 26.7 pg (ref 26.0–34.0)
MCHC: 31.2 g/dL (ref 30.0–36.0)
MCV: 85.7 fL (ref 80.0–100.0)
Monocytes Absolute: 0.9 10*3/uL (ref 0.1–1.0)
Monocytes Relative: 6 %
Neutro Abs: 10.5 10*3/uL — ABNORMAL HIGH (ref 1.7–7.7)
Neutrophils Relative %: 69 %
Platelet Count: 224 10*3/uL (ref 150–400)
RBC: 4.75 MIL/uL (ref 3.87–5.11)
RDW: 12.9 % (ref 11.5–15.5)
WBC Count: 15 10*3/uL — ABNORMAL HIGH (ref 4.0–10.5)
nRBC: 0 % (ref 0.0–0.2)

## 2019-10-23 LAB — LIPID PANEL
Cholesterol: 110 mg/dL (ref 0–200)
HDL: 42 mg/dL (ref >40)
LDL Cholesterol: 25 mg/dL (ref 0–99)
Total CHOL/HDL Ratio: 2.6 RATIO
Triglycerides: 214 mg/dL — ABNORMAL HIGH (ref <150)
VLDL: 43 mg/dL — ABNORMAL HIGH (ref 0–40)

## 2019-10-23 LAB — CBC
HCT: 42.2 % (ref 36.0–46.0)
Hemoglobin: 13.5 g/dL (ref 12.0–15.0)
MCH: 26.5 pg (ref 26.0–34.0)
MCHC: 32 g/dL (ref 30.0–36.0)
MCV: 82.9 fL (ref 80.0–100.0)
Platelets: 375 10*3/uL (ref 150–400)
RBC: 5.09 MIL/uL (ref 3.87–5.11)
RDW: 13.3 % (ref 11.5–15.5)
WBC: 16 10*3/uL — ABNORMAL HIGH (ref 4.0–10.5)
nRBC: 0 % (ref 0.0–0.2)

## 2019-10-23 LAB — TSH: TSH: 1.998 u[IU]/mL (ref 0.350–4.500)

## 2019-10-23 LAB — RETICULOCYTES
Immature Retic Fract: 9.2 % (ref 2.3–15.9)
RBC.: 4.76 MIL/uL (ref 3.87–5.11)
Retic Count, Absolute: 66.2 10*3/uL (ref 19.0–186.0)
Retic Ct Pct: 1.4 % (ref 0.4–3.1)

## 2019-10-23 LAB — VITAMIN D 25 HYDROXY (VIT D DEFICIENCY, FRACTURES): Vit D, 25-Hydroxy: 35.58 ng/mL (ref 30–100)

## 2019-10-23 LAB — T4, FREE: Free T4: 0.89 ng/dL (ref 0.61–1.12)

## 2019-10-23 MED ORDER — DOXYCYCLINE HYCLATE 100 MG PO TABS: 200.0000 mg | ORAL_TABLET | Freq: Once | ORAL | 0 refills | Status: AC

## 2019-10-23 MED FILL — FREESTYLE LIBRE 14 DAY SENS: 28 days supply | Qty: 2 | Fill #5

## 2019-10-23 MED FILL — OZEMPIC 1 MG/DOSE SOPN: 2 | 84 days supply | Qty: 9 | Fill #0

## 2019-10-23 MED FILL — FARXIGA 10 MG TABLET: 10 | 90 days supply | Qty: 90 | Fill #2

## 2019-10-23 MED FILL — DOXYCYCLINE HYCLATE 100 MG: 100 | 1 days supply | Qty: 2 | Fill #0

## 2019-10-23 NOTE — Progress Notes (Signed)
Hematology and Oncology Follow Up Visit  Michelle Tanner [id number] 1971/01/10 49 y.o. 10/23/2019   Principle Diagnosis:  Iron deficiency anemia Alpha thalassemia  Current Therapy:        IV iron as indicated Folic acid 1 mg PO daily   Interim History:  Michelle Tanner is here today for follow-up. She is doing well but has had some fatigue at times. She also states that she gets the occasional "wobbly feeling".  No falls or syncopal episodes.  She has not noted any blood loss. No bruising or petechiae.  She did have a tick removed from her left shoulder yesterday by her daughter. She has a nickel sized red welp with scabbed center at the site. We will treat her prophylactically with doxycycline and she will follow-up with her PCP for lab work next week.  She states that over the summer she will be having several surgeries (cyst removed from right buttock, abdominal scar revision and right shoulder surgery).  No fever, chills, n/v, cough, rash, SOB, chest pain, pain, palpitations, abdominal pain or changes in bowel or bladder habits.  The puffiness in her feet and ankles comes and goes. This does improve with propping up her feet.  The has intermittent neuropathy in her feet and fingertips. This waxes and wanes.  She has maintained a good appetite and states that she has been going to the weight loss clinic. She admits that she needs to hydrate better daily. Her weight is stable.   ECOG Performance Status: 1 - Symptomatic but completely ambulatory  Medications:  Allergies as of 10/23/2019      Reactions   Latex Hives      Medication List       Accurate as of October 23, 2019  3:14 PM. If you have any questions, ask your nurse or doctor.        acetaminophen 650 MG CR tablet Commonly known as: TYLENOL Take 650 mg by mouth every 8 (eight) hours as needed for pain.   clotrimazole 2 % vaginal cream Commonly known as: GYNE-LOTRIMIN 3 Gyne-Lotrimin 2 % vaginal cream  Insert 1  applicatorful by vaginal route for 7 days.   diclofenac Sodium 1 % Gel Commonly known as: VOLTAREN Apply topically as needed.   Farxiga 10 MG Tabs tablet Generic drug: dapagliflozin propanediol Take 10 mg by mouth daily.   folic acid 1 MG tablet Commonly known as: FOLVITE Take 1 tablet (1 mg total) by mouth daily.   Fusion Plus Caps Take 1 capsule by mouth daily. 1 capsule daily   ibuprofen 800 MG tablet Commonly known as: ADVIL Take 800 mg by mouth every 8 (eight) hours as needed. For pain   Linzess 145 MCG Caps capsule Generic drug: linaclotide Take 145 mcg by mouth daily before breakfast.   meloxicam 7.5 MG tablet Commonly known as: Mobic One tab po qd prn   metFORMIN 1000 MG tablet Commonly known as: GLUCOPHAGE TAKE 1 TABLET BY MOUTH TWO TIMES A DAY WITH MORNING AND EVENING MEALS   multivitamin with minerals Tabs tablet Take 1 tablet by mouth daily.   Ozempic (1 MG/DOSE) 2 MG/1.5ML Sopn Generic drug: Semaglutide (1 MG/DOSE) Inject 1 mg into the skin once a week.   pregabalin 150 MG capsule Commonly known as: LYRICA Take 1 capsule (150 mg total) by mouth 2 (two) times daily.   SUMAtriptan 50 MG tablet Commonly known as: IMITREX Take 1 tablet (50 mg total) by mouth every 2 (two) hours as needed.  traMADol 50 MG tablet Commonly known as: Ultram Take 1 tablet (50 mg total) by mouth every 6 (six) hours as needed.   Tyler Aas FlexTouch 200 UNIT/ML FlexTouch Pen Generic drug: insulin degludec INJECT 30 UNITS UNDER THE SKIN AT BEDTIME (MAX DOSE IS 80 UNITS) What changed: See the new instructions.   Unifine Pentips Plus 31G X 5 MM Misc Generic drug: Insulin Pen Needle Use as directed with Ozempic pen   valACYclovir 500 MG tablet Commonly known as: VALTREX Take 500 mg by mouth 3 (three) times daily.       Allergies:  Allergies  Allergen Reactions  . Latex Hives    Past Medical History, Surgical history, Social history, and Family History were  reviewed and updated.  Review of Systems: All other 10 point review of systems is negative.   Physical Exam:  vitals were not taken for this visit.   Wt Readings from Last 3 Encounters:  10/13/19 188 lb 12.8 oz (85.6 kg)  10/05/19 185 lb (83.9 kg)  09/21/19 184 lb (83.5 kg)    Ocular: Sclerae unicteric, pupils equal, round and reactive to light Ear-nose-throat: Oropharynx clear, dentition fair Lymphatic: No cervical or supraclavicular adenopathy Lungs no rales or rhonchi, good excursion bilaterally Heart regular rate and rhythm, no murmur appreciated Abd soft, nontender, positive bowel sounds, no liver or spleen tip palpated on exam, no fluid wave  MSK no focal spinal tenderness, no joint edema Neuro: non-focal, well-oriented, appropriate affect Breasts: Deferred   Lab Results  Component Value Date   WBC 15.0 (H) 10/23/2019   HGB 12.7 10/23/2019   HCT 40.7 10/23/2019   MCV 85.7 10/23/2019   PLT 224 10/23/2019   Lab Results  Component Value Date   FERRITIN 464 (H) 06/26/2019   IRON 37 (L) 06/26/2019   TIBC 240 06/26/2019   UIBC 203 06/26/2019   IRONPCTSAT 16 (L) 06/26/2019   Lab Results  Component Value Date   RETICCTPCT 1.4 10/23/2019   RBC 4.76 10/23/2019   No results found for: KPAFRELGTCHN, LAMBDASER, KAPLAMBRATIO No results found for: IGGSERUM, IGA, IGMSERUM No results found for: Odetta Pink, SPEI   Chemistry      Component Value Date/Time   NA 137 10/23/2019 1255   NA 135 07/23/2019 1315   K 3.8 10/23/2019 1255   CL 99 10/23/2019 1255   CO2 27 10/23/2019 1255   BUN 12 10/23/2019 1255   BUN 8 07/23/2019 1315   CREATININE 0.63 10/23/2019 1255   CREATININE 0.58 03/26/2019 1516   GLU 78 08/29/2017 0000      Component Value Date/Time   CALCIUM 9.7 10/23/2019 1255   ALKPHOS 102 10/23/2019 1255   AST 23 10/23/2019 1255   AST 20 03/26/2019 1516   ALT 20 10/23/2019 1255   ALT 19 03/26/2019 1516    BILITOT 0.4 10/23/2019 1255   BILITOT <0.2 07/23/2019 1315   BILITOT 0.3 03/26/2019 1516       Impression and Plan: Michelle Tanner is a very pleasant 49yo female with iron deficiency anemia. We will see what her iron studies look like and replace if needed.  Doxycycline prescription sent to [facility] outpatient pharmacy per her request.  She will return in 4 months.  She will contact our office with any questions or concerns. We can certainly see her sooner if needed.   Laverna Peace, NP 6/4/20213:14 PM

## 2019-10-23 NOTE — Addendum Note (Signed)
Addended by: Santiago Bur on: 10/23/2019 12:24 PM   Modules accepted: Orders

## 2019-10-24 LAB — INSULIN, RANDOM: Insulin: 27.2 u[IU]/mL — ABNORMAL HIGH (ref 2.6–24.9)

## 2019-10-24 NOTE — Telephone Encounter (Signed)
Last 10/13/19 next  01/14/20

## 2019-10-26 ENCOUNTER — Other Ambulatory Visit: Payer: Self-pay

## 2019-10-26 ENCOUNTER — Encounter (INDEPENDENT_AMBULATORY_CARE_PROVIDER_SITE_OTHER): Payer: Self-pay | Admitting: Family Medicine

## 2019-10-26 ENCOUNTER — Other Ambulatory Visit (HOSPITAL_COMMUNITY): Payer: Self-pay | Admitting: Obstetrics and Gynecology

## 2019-10-26 ENCOUNTER — Ambulatory Visit (INDEPENDENT_AMBULATORY_CARE_PROVIDER_SITE_OTHER): Payer: 59 | Admitting: Family Medicine

## 2019-10-26 VITALS — BP 110/75 | HR 102 | Temp 98.4°F | Ht 60.0 in | Wt 184.0 lb

## 2019-10-26 DIAGNOSIS — R5383 Other fatigue: Secondary | ICD-10-CM

## 2019-10-26 DIAGNOSIS — Z6835 Body mass index (BMI) 35.0-35.9, adult: Secondary | ICD-10-CM

## 2019-10-26 DIAGNOSIS — M7501 Adhesive capsulitis of right shoulder: Secondary | ICD-10-CM | POA: Diagnosis not present

## 2019-10-26 DIAGNOSIS — R2231 Localized swelling, mass and lump, right upper limb: Secondary | ICD-10-CM | POA: Diagnosis not present

## 2019-10-26 DIAGNOSIS — M19011 Primary osteoarthritis, right shoulder: Secondary | ICD-10-CM | POA: Diagnosis not present

## 2019-10-26 LAB — IRON AND TIBC
Iron: 37 ug/dL — ABNORMAL LOW (ref 41–142)
Saturation Ratios: 14 % — ABNORMAL LOW (ref 21–57)
TIBC: 261 ug/dL (ref 236–444)
UIBC: 224 ug/dL (ref 120–384)

## 2019-10-26 LAB — FERRITIN: Ferritin: 550 ng/mL — ABNORMAL HIGH (ref 11–307)

## 2019-10-26 NOTE — Progress Notes (Signed)
Chief Complaint:   OBESITY Michelle Tanner is here to discuss her progress with her obesity treatment plan along with follow-up of her obesity related diagnoses. Michelle Tanner is on keeping a food journal and adhering to recommended goals of 1000-1100 calories and 75+ grams of protein daily and states she is following her eating plan approximately 0% of the time. Michelle Tanner states she is doing 0 minutes 0 times per week.  Today's visit was #: 7 Starting weight: 186 lbs Starting date: 07/23/2019 Today's weight: 184 lbs Today's date: 10/26/2019 Total lbs lost to date: 2 Total lbs lost since last in-office visit: 1  Interim History: Michelle Tanner has been able to lose 1 lb but she has struggled with keeping a journal. She notes increased fatigue and is not exercising currently.  Subjective:   1. Other fatigue Michelle Tanner notes increased fatigue in the last few weeks. She noted a tick which was imbedded and she took doxycycline from her hematology/oncology physician. She is seeing her primary care physician soon. She has increased sugar in her diet.  Assessment/Plan:   1. Other fatigue Michelle Tanner is to continue to decrease her sugary liquids and increase her lean protein. She will follow up with her primary care physician as needed.  2. Class 2 severe obesity with serious comorbidity and body mass index (BMI) of 35.0 to 35.9 in adult, unspecified obesity type (Michelle Tanner) Michelle Tanner is currently in the action stage of change. As such, her goal is to continue with weight loss efforts. She has agreed to keeping a food journal and adhering to recommended goals of 1000-1100 calories and 75+ grams of protein daily.   Exercise goals: All adults should avoid inactivity. Some physical activity is better than none, and adults who participate in any amount of physical activity gain some health benefits.  Behavioral modification strategies: increasing lean protein intake.  Michelle Tanner has agreed to follow-up with our  clinic in 3 weeks. She was informed of the importance of frequent follow-up visits to maximize her success with intensive lifestyle modifications for her multiple health conditions.   Objective:   Blood pressure 110/75, pulse (!) 102, temperature 98.4 F (36.9 C), temperature source Oral, height 5' (1.524 m), weight 184 lb (83.5 kg), last menstrual period 09/29/2019, SpO2 98 %. Body mass index is 35.94 kg/m.  General: Cooperative, alert, well developed, in no acute distress. HEENT: Conjunctivae and lids unremarkable. Cardiovascular: Regular rhythm.  Lungs: Normal work of breathing. Neurologic: No focal deficits.   Lab Results  Component Value Date   CREATININE 0.63 10/23/2019   BUN 12 10/23/2019   NA 137 10/23/2019   K 3.8 10/23/2019   CL 99 10/23/2019   CO2 27 10/23/2019   Lab Results  Component Value Date   ALT 20 10/23/2019   AST 23 10/23/2019   ALKPHOS 102 10/23/2019   BILITOT 0.4 10/23/2019   Lab Results  Component Value Date   HGBA1C 8.8 (H) 07/16/2019   HGBA1C 9.5 (H) 04/16/2019   HGBA1C 9.2 (H) 03/11/2019   HGBA1C 10.0 (H) 01/07/2019   HGBA1C 10.1 (H) 11/12/2018   No results found for: INSULIN Lab Results  Component Value Date   TSH 1.998 10/23/2019   Lab Results  Component Value Date   CHOL 110 10/23/2019   HDL 42 10/23/2019   LDLCALC 25 10/23/2019   TRIG 214 (H) 10/23/2019   CHOLHDL 2.6 10/23/2019   Lab Results  Component Value Date   WBC 15.0 (H) 10/23/2019   HGB 12.7 10/23/2019   HCT  40.7 10/23/2019   MCV 85.7 10/23/2019   PLT 224 10/23/2019   Lab Results  Component Value Date   IRON 37 (L) 10/23/2019   TIBC 261 10/23/2019   FERRITIN 550 (H) 10/23/2019   Attestation Statements:   Reviewed by clinician on day of visit: allergies, medications, problem list, medical history, surgical history, family history, social history, and previous encounter notes.  Time spent on visit including pre-visit chart review and post-visit care and charting  was 21 minutes.    I, Trixie Dredge, am acting as transcriptionist for Dennard Nip, MD.  I have reviewed the above documentation for accuracy and completeness, and I agree with the above. -  Dennard Nip, MD

## 2019-10-27 ENCOUNTER — Ambulatory Visit: Payer: 59 | Admitting: Internal Medicine

## 2019-10-28 ENCOUNTER — Other Ambulatory Visit: Payer: Self-pay

## 2019-10-28 ENCOUNTER — Inpatient Hospital Stay: Payer: 59

## 2019-10-28 VITALS — BP 130/84 | HR 87 | Temp 97.1°F | Resp 17

## 2019-10-28 DIAGNOSIS — Z79899 Other long term (current) drug therapy: Secondary | ICD-10-CM | POA: Diagnosis not present

## 2019-10-28 DIAGNOSIS — D509 Iron deficiency anemia, unspecified: Secondary | ICD-10-CM | POA: Diagnosis not present

## 2019-10-28 DIAGNOSIS — D560 Alpha thalassemia: Secondary | ICD-10-CM | POA: Diagnosis not present

## 2019-10-28 DIAGNOSIS — G629 Polyneuropathy, unspecified: Secondary | ICD-10-CM | POA: Diagnosis not present

## 2019-10-28 DIAGNOSIS — D500 Iron deficiency anemia secondary to blood loss (chronic): Secondary | ICD-10-CM

## 2019-10-28 MED ORDER — SODIUM CHLORIDE 0.9 % IV SOLN
200.0000 mg | Freq: Once | INTRAVENOUS | Status: AC
  Administered 2019-10-28: 200 mg via INTRAVENOUS
  Filled 2019-10-28: qty 200

## 2019-10-28 MED ORDER — SODIUM CHLORIDE 0.9 % IV SOLN
Freq: Once | INTRAVENOUS | Status: AC
  Filled 2019-10-28: qty 250
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
## 2019-10-29 ENCOUNTER — Inpatient Hospital Stay: Payer: 59

## 2019-10-29 VITALS — BP 135/84 | HR 87 | Temp 97.8°F | Resp 17 | Ht 60.0 in | Wt 184.0 lb

## 2019-10-29 DIAGNOSIS — Z79899 Other long term (current) drug therapy: Secondary | ICD-10-CM | POA: Diagnosis not present

## 2019-10-29 DIAGNOSIS — D500 Iron deficiency anemia secondary to blood loss (chronic): Secondary | ICD-10-CM

## 2019-10-29 DIAGNOSIS — D560 Alpha thalassemia: Secondary | ICD-10-CM | POA: Diagnosis not present

## 2019-10-29 DIAGNOSIS — G629 Polyneuropathy, unspecified: Secondary | ICD-10-CM | POA: Diagnosis not present

## 2019-10-29 DIAGNOSIS — D509 Iron deficiency anemia, unspecified: Secondary | ICD-10-CM | POA: Diagnosis not present

## 2019-10-29 MED ORDER — SODIUM CHLORIDE 0.9 % IV SOLN
Freq: Once | INTRAVENOUS | Status: AC
  Filled 2019-10-29: qty 250

## 2019-10-29 MED ORDER — SODIUM CHLORIDE 0.9 % IV SOLN
200.0000 mg | Freq: Once | INTRAVENOUS | Status: AC
  Administered 2019-10-29: 200 mg via INTRAVENOUS
  Filled 2019-10-29: qty 200

## 2019-11-02 ENCOUNTER — Other Ambulatory Visit: Payer: Self-pay

## 2019-11-02 ENCOUNTER — Inpatient Hospital Stay: Payer: 59

## 2019-11-02 VITALS — BP 132/77 | HR 101 | Temp 98.2°F | Resp 20

## 2019-11-02 DIAGNOSIS — D500 Iron deficiency anemia secondary to blood loss (chronic): Secondary | ICD-10-CM

## 2019-11-02 DIAGNOSIS — Z79899 Other long term (current) drug therapy: Secondary | ICD-10-CM | POA: Diagnosis not present

## 2019-11-02 DIAGNOSIS — D560 Alpha thalassemia: Secondary | ICD-10-CM | POA: Diagnosis not present

## 2019-11-02 DIAGNOSIS — G629 Polyneuropathy, unspecified: Secondary | ICD-10-CM | POA: Diagnosis not present

## 2019-11-02 DIAGNOSIS — D509 Iron deficiency anemia, unspecified: Secondary | ICD-10-CM | POA: Diagnosis not present

## 2019-11-02 MED ORDER — SODIUM CHLORIDE 0.9 % IV SOLN
Freq: Once | INTRAVENOUS | Status: AC
  Filled 2019-11-02: qty 250

## 2019-11-02 MED ORDER — SODIUM CHLORIDE 0.9 % IV SOLN
200.0000 mg | Freq: Once | INTRAVENOUS | Status: AC
  Administered 2019-11-02: 200 mg via INTRAVENOUS
  Filled 2019-11-02: qty 10

## 2019-11-02 NOTE — Patient Instructions (Signed)

## 2019-11-03 ENCOUNTER — Inpatient Hospital Stay: Payer: 59

## 2019-11-03 VITALS — BP 136/77 | HR 89 | Temp 96.9°F | Resp 18

## 2019-11-03 DIAGNOSIS — D560 Alpha thalassemia: Secondary | ICD-10-CM | POA: Diagnosis not present

## 2019-11-03 DIAGNOSIS — D500 Iron deficiency anemia secondary to blood loss (chronic): Secondary | ICD-10-CM

## 2019-11-03 DIAGNOSIS — G629 Polyneuropathy, unspecified: Secondary | ICD-10-CM | POA: Diagnosis not present

## 2019-11-03 DIAGNOSIS — Z79899 Other long term (current) drug therapy: Secondary | ICD-10-CM | POA: Diagnosis not present

## 2019-11-03 DIAGNOSIS — D509 Iron deficiency anemia, unspecified: Secondary | ICD-10-CM | POA: Diagnosis not present

## 2019-11-03 MED ORDER — SODIUM CHLORIDE 0.9 % IV SOLN
200.0000 mg | Freq: Once | INTRAVENOUS | Status: AC
  Administered 2019-11-03: 200 mg via INTRAVENOUS
  Filled 2019-11-03: qty 200

## 2019-11-03 MED ORDER — SODIUM CHLORIDE 0.9 % IV SOLN
Freq: Once | INTRAVENOUS | Status: AC
  Filled 2019-11-03: qty 250

## 2019-11-04 ENCOUNTER — Inpatient Hospital Stay: Payer: 59

## 2019-11-04 ENCOUNTER — Other Ambulatory Visit: Payer: Self-pay

## 2019-11-04 VITALS — BP 133/69 | HR 88 | Temp 97.1°F | Resp 17

## 2019-11-04 DIAGNOSIS — D509 Iron deficiency anemia, unspecified: Secondary | ICD-10-CM | POA: Diagnosis not present

## 2019-11-04 DIAGNOSIS — G629 Polyneuropathy, unspecified: Secondary | ICD-10-CM | POA: Diagnosis not present

## 2019-11-04 DIAGNOSIS — D500 Iron deficiency anemia secondary to blood loss (chronic): Secondary | ICD-10-CM

## 2019-11-04 DIAGNOSIS — D560 Alpha thalassemia: Secondary | ICD-10-CM | POA: Diagnosis not present

## 2019-11-04 DIAGNOSIS — Z79899 Other long term (current) drug therapy: Secondary | ICD-10-CM | POA: Diagnosis not present

## 2019-11-04 MED ORDER — SODIUM CHLORIDE 0.9 % IV SOLN
200.0000 mg | Freq: Once | INTRAVENOUS | Status: AC
  Administered 2019-11-04: 200 mg via INTRAVENOUS
  Filled 2019-11-04: qty 200

## 2019-11-04 MED ORDER — SODIUM CHLORIDE 0.9 % IV SOLN
INTRAVENOUS | Status: DC
  Filled 2019-11-04: qty 250

## 2019-11-04 NOTE — Patient Instructions (Signed)

## 2019-11-04 NOTE — Progress Notes (Signed)
Patient declined to stay for post iron observation, VS taken and feels well.

## 2019-11-05 ENCOUNTER — Ambulatory Visit
Admission: RE | Admit: 2019-11-05 | Discharge: 2019-11-05 | Disposition: A | Discharge status: Home / Self Care | Payer: 59 | Source: Ambulatory Visit | Attending: General Surgery | Admitting: General Surgery

## 2019-11-05 DIAGNOSIS — K760 Fatty (change of) liver, not elsewhere classified: Secondary | ICD-10-CM | POA: Diagnosis not present

## 2019-11-05 DIAGNOSIS — R102 Pelvic and perineal pain: Secondary | ICD-10-CM

## 2019-11-11 ENCOUNTER — Telehealth (INDEPENDENT_AMBULATORY_CARE_PROVIDER_SITE_OTHER): Payer: 59 | Admitting: Psychology

## 2019-11-11 ENCOUNTER — Other Ambulatory Visit: Payer: Self-pay

## 2019-11-11 DIAGNOSIS — F3289 Other specified depressive episodes: Secondary | ICD-10-CM

## 2019-11-12 ENCOUNTER — Encounter: Payer: Self-pay | Admitting: Internal Medicine

## 2019-11-16 ENCOUNTER — Other Ambulatory Visit: Payer: Self-pay

## 2019-11-16 ENCOUNTER — Ambulatory Visit (INDEPENDENT_AMBULATORY_CARE_PROVIDER_SITE_OTHER): Payer: 59 | Admitting: Family Medicine

## 2019-11-16 ENCOUNTER — Encounter (INDEPENDENT_AMBULATORY_CARE_PROVIDER_SITE_OTHER): Payer: Self-pay | Admitting: Family Medicine

## 2019-11-16 VITALS — BP 111/76 | HR 92 | Temp 98.2°F | Ht 60.0 in | Wt 184.0 lb

## 2019-11-16 DIAGNOSIS — Z794 Long term (current) use of insulin: Secondary | ICD-10-CM

## 2019-11-16 DIAGNOSIS — Z9189 Other specified personal risk factors, not elsewhere classified: Secondary | ICD-10-CM

## 2019-11-16 DIAGNOSIS — E1169 Type 2 diabetes mellitus with other specified complication: Secondary | ICD-10-CM

## 2019-11-16 DIAGNOSIS — Z6836 Body mass index (BMI) 36.0-36.9, adult: Secondary | ICD-10-CM | POA: Diagnosis not present

## 2019-11-16 DIAGNOSIS — F3289 Other specified depressive episodes: Secondary | ICD-10-CM | POA: Diagnosis not present

## 2019-11-16 MED ORDER — BUPROPION HCL ER (SR) 150 MG PO TB12: 150.0000 mg | ORAL_TABLET | Freq: Every day | ORAL | 0 refills | Status: DC

## 2019-11-16 MED FILL — BUPROPION HCL SR 150 MG TAB: 150 | 30 days supply | Qty: 30 | Fill #0

## 2019-11-17 ENCOUNTER — Other Ambulatory Visit: Payer: Self-pay | Admitting: Orthopedic Surgery

## 2019-11-17 ENCOUNTER — Other Ambulatory Visit (HOSPITAL_COMMUNITY): Payer: Self-pay | Admitting: Orthopedic Surgery

## 2019-11-17 DIAGNOSIS — M25512 Pain in left shoulder: Secondary | ICD-10-CM

## 2019-11-17 DIAGNOSIS — M25531 Pain in right wrist: Secondary | ICD-10-CM

## 2019-11-17 NOTE — Progress Notes (Signed)
Chief Complaint:   OBESITY Michelle Tanner is here to discuss her progress with her obesity treatment plan along with follow-up of her obesity related diagnoses. Michelle Tanner is on keeping a food journal and adhering to recommended goals of 1000-1100 calories and 75+ grams of protein daily and states she is following her eating plan approximately 12% of the time. Michelle Tanner states she is doing  0 minutes 0 times per week.  Today's visit was #: 8 Starting weight: 186 lbs Starting date: 07/23/2019 Today's weight: 184 lbs Today's date: 11/17/2019 Total lbs lost to date: 2 Total lbs lost since last in-office visit: 0  Interim History: Michelle Tanner has done well maintaining her weight. Most days she is doing portion control and making smarter food choices. She is struggling to make all the changes we have asked of her and she is frustrated with herself.  Subjective:   1. Type 2 diabetes mellitus with other specified complication, with long-term current use of insulin (HCC) Michelle Tanner is not wearing her CGM due to waiting to have her MRI, and their instructions to not wear the CGM. She is stable on her medications.  2. Other depression with emotional eating Michelle Tanner is struggling with some emotional eating behaviors where she knows what she soul do but she struggles to get herself to make the changes.  3. At risk for heart disease Michelle Tanner is at a higher than average risk for cardiovascular disease due to obesity.   Assessment/Plan:   1. Type 2 diabetes mellitus with other specified complication, with long-term current use of insulin (HCC) Good blood sugar control is important to decrease the likelihood of diabetic complications such as nephropathy, neuropathy, limb loss, blindness, coronary artery disease, and death. Intensive lifestyle modification including diet, exercise and weight loss are the first line of treatment for diabetes. Michelle Tanner will continue her diet and exercise, and will recheck  her BGs as soon as she can.  2. Other depression with emotional eating Behavior modification techniques were discussed today to help Michelle Tanner deal with her emotional/non-hunger eating behaviors. Michelle Tanner agreed to start Wellbutrin SR 150 mg q AM with no refills, and she will continue to follow up with Michelle Tanner. Orders and follow up as documented in patient record.   - buPROPion (WELLBUTRIN SR) 150 MG 12 hr tablet; Take 1 tablet (150 mg total) by mouth daily.  Dispense: 30 tablet; Refill: 0  3. At risk for heart disease Michelle Tanner was given approximately 15 minutes of coronary artery disease prevention counseling today. She is 49 y.o. female and has risk factors for heart disease including obesity. We discussed intensive lifestyle modifications today with an emphasis on specific weight loss instructions and strategies.   Repetitive spaced learning was employed today to elicit superior memory formation and behavioral change.  4. Class 2 severe obesity with serious comorbidity and body mass index (BMI) of 36.0 to 36.9 in adult, unspecified obesity type (Michelle Tanner) Michelle Tanner is currently in the action stage of change. As such, her goal is to continue with weight loss efforts. She has agreed to the Category 1 Plan.   Behavioral modification strategies: emotional eating strategies.  Michelle Tanner has agreed to follow-up with our clinic in 2 to 3 weeks. She was informed of the importance of frequent follow-up visits to maximize her success with intensive lifestyle modifications for her multiple health conditions.   Objective:   Blood pressure 111/76, pulse 92, temperature 98.2 F (36.8 C), temperature source Oral, height 5' (1.524 m), weight 184 lb (83.5 kg),  last menstrual period 10/27/2019, SpO2 98 %. Body mass index is 35.94 kg/m.  General: Cooperative, alert, well developed, in no acute distress. HEENT: Conjunctivae and lids unremarkable. Cardiovascular: Regular rhythm.  Lungs: Normal work of  breathing. Neurologic: No focal deficits.   Lab Results  Component Value Date   CREATININE 0.63 10/23/2019   BUN 12 10/23/2019   NA 137 10/23/2019   K 3.8 10/23/2019   CL 99 10/23/2019   CO2 27 10/23/2019   Lab Results  Component Value Date   ALT 20 10/23/2019   AST 23 10/23/2019   ALKPHOS 102 10/23/2019   BILITOT 0.4 10/23/2019   Lab Results  Component Value Date   HGBA1C 8.8 (H) 07/16/2019   HGBA1C 9.5 (H) 04/16/2019   HGBA1C 9.2 (H) 03/11/2019   HGBA1C 10.0 (H) 01/07/2019   HGBA1C 10.1 (H) 11/12/2018   No results found for: INSULIN Lab Results  Component Value Date   TSH 1.998 10/23/2019   Lab Results  Component Value Date   CHOL 110 10/23/2019   HDL 42 10/23/2019   LDLCALC 25 10/23/2019   TRIG 214 (H) 10/23/2019   CHOLHDL 2.6 10/23/2019   Lab Results  Component Value Date   WBC 15.0 (H) 10/23/2019   HGB 12.7 10/23/2019   HCT 40.7 10/23/2019   MCV 85.7 10/23/2019   PLT 224 10/23/2019   Lab Results  Component Value Date   IRON 37 (L) 10/23/2019   TIBC 261 10/23/2019   FERRITIN 550 (H) 10/23/2019   Attestation Statements:   Reviewed by clinician on day of visit: allergies, medications, problem list, medical history, surgical history, family history, social history, and previous encounter notes.   I, Trixie Dredge, am acting as transcriptionist for Dennard Nip, MD.  I have reviewed the above documentation for accuracy and completeness, and I agree with the above. -  Dennard Nip, MD

## 2019-11-18 ENCOUNTER — Other Ambulatory Visit: Payer: Self-pay | Admitting: Internal Medicine

## 2019-11-19 ENCOUNTER — Ambulatory Visit: Payer: Self-pay | Admitting: General Surgery

## 2019-11-20 MED FILL — PREGABALIN 150 MG CAPS: 150 | 30 days supply | Qty: 60 | Fill #1

## 2019-11-23 ENCOUNTER — Other Ambulatory Visit: Payer: Self-pay | Admitting: Internal Medicine

## 2019-11-23 MED FILL — TRESIBA FLEXTOUCH 200 UNITS: 200 | 21 days supply | Qty: 3 | Fill #0

## 2019-11-24 ENCOUNTER — Other Ambulatory Visit (HOSPITAL_COMMUNITY): Payer: Self-pay | Admitting: Internal Medicine

## 2019-11-25 DIAGNOSIS — M25511 Pain in right shoulder: Secondary | ICD-10-CM | POA: Diagnosis not present

## 2019-11-25 NOTE — Progress Notes (Unsigned)
Office: [phone]  /  Fax: [phone]    Date: December 09, 2019   Appointment Start Time: *** Duration: *** minutes Provider: Glennie Isle, Psy.D. Type of Session: Individual Therapy  Location of Patient: {gbptloc:23249} Location of Provider: {Location of Service:22491} Type of Contact: Telepsychological Visit via MyChart Video Visit  Session Content: This provider called Michelle Tanner at 2:02pm as she did not present for the telepsychological appointment. *** As such, today's appointment was initiated *** minutes late.  Michelle Tanner is a 49 y.o. female presenting via [place] Visit for a follow-up appointment to address the previously established treatment goal of decreasing emotional eating. Today's appointment was a telepsychological visit due to COVID-19. Michelle Tanner provided verbal consent for today's telepsychological appointment and she is aware she is responsible for securing confidentiality on her end of the session. Prior to proceeding with today's appointment, Michelle Tanner's physical location at the time of this appointment was obtained as well a phone number she could be reached at in the event of technical difficulties. Michelle Tanner and this provider participated in today's telepsychological service.   This provider conducted a brief check-in and verbally administered the PHQ-9 and GAD-7. *** Michelle Tanner was receptive to today's appointment as evidenced by openness to sharing, responsiveness to feedback, and {gbreceptiveness:23401}.  Mental Status Examination:  Appearance: {Appearance:22431} Behavior: {Behavior:22445} Mood: {gbmood:21757} Affect: {Affect:22436} Speech: {Speech:22432} Eye Contact: {Eye Contact:22433} Psychomotor Activity: {Motor Activity:22434} Gait: {gbgait:23404} Thought Process: {thought process:22448}  Thought Content/Perception: {disturbances:22451} Orientation: {Orientation:22437} Memory/Concentration: {gbcognition:22449} Insight/Judgment:  {Insight:22446}  Structured Assessments Results: The Patient Health Questionnaire-9 (PHQ-9) is a self-report measure that assesses symptoms and severity of depression over the course of the last two weeks. Michelle Tanner obtained a score of *** suggesting {GBPHQ9SEVERITY:21752}. Michelle Tanner finds the endorsed symptoms to be {gbphq9difficulty:21754}. [0= Not at all; 1= Several days; 2= More than half the days; 3= Nearly every day] Little interest or pleasure in doing things ***  Feeling down, depressed, or hopeless ***  Trouble falling or staying asleep, or sleeping too much ***  Feeling tired or having little energy ***  Poor appetite or overeating ***  Feeling bad about yourself --- or that you are a failure or have let yourself or your family down ***  Trouble concentrating on things, such as reading the newspaper or watching television ***  Moving or speaking so slowly that other people could have noticed? Or the opposite --- being so fidgety or restless that you have been moving around a lot more than usual ***  Thoughts that you would be better off dead or hurting yourself in some way ***  PHQ-9 Score ***    The Generalized Anxiety Disorder-7 (GAD-7) is a brief self-report measure that assesses symptoms of anxiety over the course of the last two weeks. Michelle Tanner obtained a score of *** suggesting {gbgad7severity:21753}. Michelle Tanner finds the endorsed symptoms to be {gbphq9difficulty:21754}. [0= Not at all; 1= Several days; 2= Over half the days; 3= Nearly every day] Feeling nervous, anxious, on edge ***  Not being able to stop or control worrying ***  Worrying too much about different things ***  Trouble relaxing ***  Being so restless that it's hard to sit still ***  Becoming easily annoyed or irritable ***  Feeling afraid as if something awful might happen ***  GAD-7 Score ***   Interventions:  {Interventions for Progress Notes:23405}  DSM-5 Diagnosis(es): 311 (F32.8) Other Specified  Depressive Disorder, Emotional Eating Behaviors  Treatment Goal & Progress: During the initial appointment with this provider, the following treatment goal was  established: decrease emotional eating. Michelle Tanner has demonstrated progress in her goal as evidenced by {gbtxprogress:22839}. Michelle Tanner also {gbtxprogress2:22951}.  Plan: The next appointment will be scheduled in {gbweeks:21758}, which will be {gbtxmodality:23402}. The next session will focus on {Plan for Next Appointment:23400}.

## 2019-12-01 ENCOUNTER — Ambulatory Visit (HOSPITAL_COMMUNITY)
Admission: RE | Admit: 2019-12-01 | Discharge: 2019-12-01 | Disposition: A | Discharge status: Home / Self Care | Payer: 59 | Source: Ambulatory Visit | Attending: Orthopedic Surgery | Admitting: Orthopedic Surgery

## 2019-12-01 ENCOUNTER — Other Ambulatory Visit: Payer: Self-pay

## 2019-12-01 ENCOUNTER — Other Ambulatory Visit (HOSPITAL_COMMUNITY): Payer: Self-pay | Admitting: Orthopedic Surgery

## 2019-12-01 DIAGNOSIS — M7551 Bursitis of right shoulder: Secondary | ICD-10-CM | POA: Diagnosis not present

## 2019-12-01 DIAGNOSIS — M25512 Pain in left shoulder: Secondary | ICD-10-CM | POA: Insufficient documentation

## 2019-12-01 DIAGNOSIS — M75101 Unspecified rotator cuff tear or rupture of right shoulder, not specified as traumatic: Secondary | ICD-10-CM | POA: Diagnosis not present

## 2019-12-01 DIAGNOSIS — M25531 Pain in right wrist: Secondary | ICD-10-CM

## 2019-12-01 DIAGNOSIS — G8929 Other chronic pain: Secondary | ICD-10-CM | POA: Diagnosis not present

## 2019-12-07 ENCOUNTER — Encounter (INDEPENDENT_AMBULATORY_CARE_PROVIDER_SITE_OTHER): Payer: Self-pay

## 2019-12-07 ENCOUNTER — Ambulatory Visit (INDEPENDENT_AMBULATORY_CARE_PROVIDER_SITE_OTHER): Payer: 59 | Admitting: Family Medicine

## 2019-12-09 ENCOUNTER — Telehealth (INDEPENDENT_AMBULATORY_CARE_PROVIDER_SITE_OTHER): Payer: Self-pay | Admitting: Psychology

## 2019-12-09 ENCOUNTER — Telehealth (INDEPENDENT_AMBULATORY_CARE_PROVIDER_SITE_OTHER): Payer: 59 | Admitting: Psychology

## 2019-12-09 ENCOUNTER — Encounter (INDEPENDENT_AMBULATORY_CARE_PROVIDER_SITE_OTHER): Payer: Self-pay

## 2019-12-09 ENCOUNTER — Other Ambulatory Visit: Payer: Self-pay | Admitting: Internal Medicine

## 2019-12-09 ENCOUNTER — Other Ambulatory Visit: Payer: Self-pay

## 2019-12-09 DIAGNOSIS — M7501 Adhesive capsulitis of right shoulder: Secondary | ICD-10-CM | POA: Diagnosis not present

## 2019-12-09 DIAGNOSIS — M75111 Incomplete rotator cuff tear or rupture of right shoulder, not specified as traumatic: Secondary | ICD-10-CM | POA: Diagnosis not present

## 2019-12-09 MED FILL — TRESIBA FLEXTOUCH 200 UNITS: 200 | 23 days supply | Qty: 9 | Fill #0

## 2019-12-09 MED FILL — FUSION PLUS CAPSULE: 90 days supply | Qty: 90 | Fill #1

## 2019-12-09 NOTE — Telephone Encounter (Signed)
°  Office: [phone]  /  Fax: [phone]  Date of Call: December 09, 2019  Time of Call: 4:57pm Provider: Glennie Isle, PsyD  CONTENT: This provider called [facility] to reschedule today's appointment as a one time no show fee waiver was applied to her account. A HIPAA compliant voicemail was left requesting a call back.   PLAN: This provider will wait for [place] to call back. No further follow-up planned by this provider.

## 2019-12-09 NOTE — Telephone Encounter (Signed)
  Office: [phone]  /  Fax: [phone]  Date of Call: December 09, 2019  Time of Call: 2:02pm Duration of Call: ~1.5 minutes Provider: Glennie Isle, PsyD  CONTENT: This provider called Michelle Tanner to check-in as she did not present for today's MyChart Video Visit appointment at 2:00pm.Michelle Tanner indicated she forgot about today's appointment and was at a nail salon. She was receptive to rescheduling. This provider explained someone from the clinic would call her as there may be a no show fee that would need to be paid prior to rescheduling. She acknowledged understanding. No evidence or endorsement of safety concerns.   PLAN: This provider informed front desk of the aforementioned. Michelle Tanner will be contacted to reschedule.

## 2019-12-10 MED FILL — metFORMIN HCL 1000 MG TABS: 1000 | 90 days supply | Qty: 180 | Fill #0

## 2019-12-16 ENCOUNTER — Other Ambulatory Visit: Payer: Self-pay

## 2019-12-16 NOTE — Telephone Encounter (Signed)
The pt was notified that her Michelle Tanner is ready at the pharmacy and that the pharmacy is having trouble with their notification system and that they apologize that she didn't get the notification and that there are 2 more prescriptions ready at the pharmacy.

## 2019-12-17 ENCOUNTER — Telehealth: Payer: Self-pay

## 2019-12-17 NOTE — Telephone Encounter (Signed)
I called the pt because Dr. Baird Cancer wants to know when was the pt's last eye exam.

## 2019-12-21 ENCOUNTER — Other Ambulatory Visit: Payer: 59

## 2019-12-22 MED FILL — PREGABALIN 150 MG CAPS: 150 | 30 days supply | Qty: 60 | Fill #2

## 2019-12-30 ENCOUNTER — Ambulatory Visit: Payer: Self-pay | Admitting: Nurse Practitioner

## 2019-12-30 DIAGNOSIS — M531 Cervicobrachial syndrome: Secondary | ICD-10-CM | POA: Diagnosis not present

## 2019-12-30 DIAGNOSIS — M9905 Segmental and somatic dysfunction of pelvic region: Secondary | ICD-10-CM | POA: Diagnosis not present

## 2019-12-30 DIAGNOSIS — M9901 Segmental and somatic dysfunction of cervical region: Secondary | ICD-10-CM | POA: Diagnosis not present

## 2019-12-30 DIAGNOSIS — M9902 Segmental and somatic dysfunction of thoracic region: Secondary | ICD-10-CM | POA: Diagnosis not present

## 2019-12-30 DIAGNOSIS — M9903 Segmental and somatic dysfunction of lumbar region: Secondary | ICD-10-CM | POA: Diagnosis not present

## 2019-12-30 DIAGNOSIS — M62830 Muscle spasm of back: Secondary | ICD-10-CM | POA: Diagnosis not present

## 2020-01-05 DIAGNOSIS — M9901 Segmental and somatic dysfunction of cervical region: Secondary | ICD-10-CM | POA: Diagnosis not present

## 2020-01-05 DIAGNOSIS — M9905 Segmental and somatic dysfunction of pelvic region: Secondary | ICD-10-CM | POA: Diagnosis not present

## 2020-01-05 DIAGNOSIS — M9903 Segmental and somatic dysfunction of lumbar region: Secondary | ICD-10-CM | POA: Diagnosis not present

## 2020-01-05 DIAGNOSIS — M531 Cervicobrachial syndrome: Secondary | ICD-10-CM | POA: Diagnosis not present

## 2020-01-05 DIAGNOSIS — M62830 Muscle spasm of back: Secondary | ICD-10-CM | POA: Diagnosis not present

## 2020-01-05 DIAGNOSIS — M9902 Segmental and somatic dysfunction of thoracic region: Secondary | ICD-10-CM | POA: Diagnosis not present

## 2020-01-07 DIAGNOSIS — M62830 Muscle spasm of back: Secondary | ICD-10-CM | POA: Diagnosis not present

## 2020-01-07 DIAGNOSIS — M9901 Segmental and somatic dysfunction of cervical region: Secondary | ICD-10-CM | POA: Diagnosis not present

## 2020-01-07 DIAGNOSIS — M9902 Segmental and somatic dysfunction of thoracic region: Secondary | ICD-10-CM | POA: Diagnosis not present

## 2020-01-07 DIAGNOSIS — M9905 Segmental and somatic dysfunction of pelvic region: Secondary | ICD-10-CM | POA: Diagnosis not present

## 2020-01-07 DIAGNOSIS — M531 Cervicobrachial syndrome: Secondary | ICD-10-CM | POA: Diagnosis not present

## 2020-01-07 DIAGNOSIS — M9903 Segmental and somatic dysfunction of lumbar region: Secondary | ICD-10-CM | POA: Diagnosis not present

## 2020-01-07 DIAGNOSIS — Z20828 Contact with and (suspected) exposure to other viral communicable diseases: Secondary | ICD-10-CM | POA: Diagnosis not present

## 2020-01-14 ENCOUNTER — Other Ambulatory Visit
Admission: RE | Admit: 2020-01-14 | Discharge: 2020-01-14 | Disposition: A | Discharge status: Home / Self Care | Payer: 59 | Source: Ambulatory Visit | Attending: Internal Medicine | Admitting: Internal Medicine

## 2020-01-14 ENCOUNTER — Encounter: Payer: Self-pay | Admitting: Internal Medicine

## 2020-01-14 ENCOUNTER — Other Ambulatory Visit: Payer: Self-pay

## 2020-01-14 ENCOUNTER — Ambulatory Visit (INDEPENDENT_AMBULATORY_CARE_PROVIDER_SITE_OTHER): Payer: 59 | Admitting: Internal Medicine

## 2020-01-14 VITALS — BP 130/78 | HR 80 | Temp 98.0°F | Ht 60.0 in | Wt 187.8 lb

## 2020-01-14 DIAGNOSIS — Z6836 Body mass index (BMI) 36.0-36.9, adult: Secondary | ICD-10-CM

## 2020-01-14 DIAGNOSIS — M62830 Muscle spasm of back: Secondary | ICD-10-CM | POA: Diagnosis not present

## 2020-01-14 DIAGNOSIS — E1141 Type 2 diabetes mellitus with diabetic mononeuropathy: Secondary | ICD-10-CM | POA: Insufficient documentation

## 2020-01-14 DIAGNOSIS — M531 Cervicobrachial syndrome: Secondary | ICD-10-CM | POA: Diagnosis not present

## 2020-01-14 DIAGNOSIS — M9905 Segmental and somatic dysfunction of pelvic region: Secondary | ICD-10-CM | POA: Diagnosis not present

## 2020-01-14 DIAGNOSIS — G43909 Migraine, unspecified, not intractable, without status migrainosus: Secondary | ICD-10-CM | POA: Diagnosis not present

## 2020-01-14 DIAGNOSIS — M9902 Segmental and somatic dysfunction of thoracic region: Secondary | ICD-10-CM | POA: Diagnosis not present

## 2020-01-14 DIAGNOSIS — M9901 Segmental and somatic dysfunction of cervical region: Secondary | ICD-10-CM | POA: Diagnosis not present

## 2020-01-14 DIAGNOSIS — M9903 Segmental and somatic dysfunction of lumbar region: Secondary | ICD-10-CM | POA: Diagnosis not present

## 2020-01-14 LAB — BASIC METABOLIC PANEL
Anion gap: 12 (ref 5–15)
BUN: 8 mg/dL (ref 6–20)
CO2: 24 mmol/L (ref 22–32)
Calcium: 9.1 mg/dL (ref 8.9–10.3)
Chloride: 103 mmol/L (ref 98–111)
Creatinine, Ser: 0.6 mg/dL (ref 0.44–1.00)
GFR calc Af Amer: >60 mL/min (ref >60)
GFR calc non Af Amer: >60 mL/min (ref >60)
Glucose, Bld: 128 mg/dL — ABNORMAL HIGH (ref 70–99)
Potassium: 3.8 mmol/L (ref 3.5–5.1)
Sodium: 139 mmol/L (ref 135–145)

## 2020-01-14 NOTE — Patient Instructions (Signed)

## 2020-01-14 NOTE — Progress Notes (Signed)
I,[name],acting as a Education administrator for [name], MD.,have documented all relevant documentation on the behalf of [name], MD,as directed by  [name], MD while in the presence of [name], MD.  This visit occurred during the SARS-CoV-2 public health emergency.  Safety protocols were in place, including screening questions prior to the visit, additional usage of staff PPE, and extensive cleaning of exam room while observing appropriate contact time as indicated for disinfecting solutions.  Subjective:     Patient ID: Michelle Tanner , female    DOB: [birth year] , 49 y.o.   MRN: [id number]   Chief Complaint  Patient presents with  . Diabetes    HPI  She presents today for dm check. She reports compliance with meds. She is refusing to get the COVID vaccine at this time.   Diabetes She presents for her follow-up diabetic visit. She has type 2 diabetes mellitus. Her disease course has been improving. There are no hypoglycemic associated symptoms. Pertinent negatives for diabetes include no blurred vision and no chest pain. There are no hypoglycemic complications. Risk factors for coronary artery disease include diabetes mellitus, dyslipidemia and hypertension. She never participates in exercise. Her home blood glucose trend is decreasing steadily. An ACE inhibitor/angiotensin II receptor blocker is not being taken.     Past Medical History:  Diagnosis Date  . Back pain   . Diabetes mellitus   . Edema, lower extremity   . Fatty liver    per patient   . Fibromyalgia   . Frozen shoulder    left and right   . Hx of migraine headaches   . Joint pain   . Migraines   . Neuromuscular disorder ([place])    diabetic neuropathy  . Shoulder pain      Family History  Problem Relation Age of Onset  . Rectal cancer Paternal Grandmother   . Diabetes Father   . Kidney disease Father   . CAD Father   . Hyperlipidemia Father   . Hypertension Father   . Heart disease  Father   . Sudden death Father   . Cancer Mother   . Cancer Paternal Uncle   . Cancer Paternal Aunt      Current Outpatient Medications:  .  acetaminophen (TYLENOL) 650 MG CR tablet, Take 650 mg by mouth every 8 (eight) hours as needed for pain., Disp: , Rfl:  .  buPROPion (WELLBUTRIN SR) 150 MG 12 hr tablet, Take 1 tablet (150 mg total) by mouth daily., Disp: 30 tablet, Rfl: 0 .  clotrimazole (GYNE-LOTRIMIN 3) 2 % vaginal cream, Gyne-Lotrimin 2 % vaginal cream  Insert 1 applicatorful by vaginal route for 7 days., Disp: , Rfl:  .  dapagliflozin propanediol (FARXIGA) 10 MG TABS tablet, Take 10 mg by mouth daily., Disp: 90 tablet, Rfl: 2 .  diclofenac Sodium (VOLTAREN) 1 % GEL, Apply topically as needed., Disp: , Rfl:  .  folic acid (FOLVITE) 1 MG tablet, Take 1 tablet (1 mg total) by mouth daily., Disp: 90 tablet, Rfl: 1 .  ibuprofen (ADVIL,MOTRIN) 800 MG tablet, Take 800 mg by mouth every 8 (eight) hours as needed. For pain, Disp: , Rfl:  .  insulin degludec (TRESIBA FLEXTOUCH) 200 UNIT/ML FlexTouch Pen, Inject 28 Units into the skin at bedtime. (Patient taking differently: Inject 30 Units into the skin at bedtime. ), Disp: 3 mL, Rfl: 3 .  Iron-FA-B Cmp-C-Biot-Probiotic (FUSION PLUS) CAPS, Take 1 capsule by mouth daily. 1 capsule daily,  Disp: 90 capsule, Rfl: 1 .  linaclotide (LINZESS) 145 MCG CAPS capsule, Take 145 mcg by mouth daily before breakfast., Disp: , Rfl:  .  meloxicam (MOBIC) 7.5 MG tablet, One tab po qd prn, Disp: 30 tablet, Rfl: 0 .  metFORMIN (GLUCOPHAGE) 1000 MG tablet, TAKE 1 TABLET BY MOUTH TWO TIMES A DAY WITH MORNING AND EVENING MEALS, Disp: 180 tablet, Rfl: 0 .  Multiple Vitamin (MULITIVITAMIN WITH MINERALS) TABS, Take 1 tablet by mouth daily., Disp: , Rfl:  .  pregabalin (LYRICA) 150 MG capsule, TAKE 1 CAPSULE (150 MG TOTAL) BY MOUTH 2 (TWO) TIMES DAILY., Disp: 60 capsule, Rfl: 2 .  Semaglutide, 1 MG/DOSE, (OZEMPIC, 1 MG/DOSE,) 2 MG/1.5ML SOPN, Inject 1 mg into the skin  once a week., Disp: 9 pen, Rfl: 1 .  Semaglutide, 1 MG/DOSE, (OZEMPIC, 1 MG/DOSE,) 2 MG/1.5ML SOPN, Ozempic 1 mg/dose (2 mg/1.5 mL) subcutaneous pen injector  INJECT 1 MG INTO THE SKIN ONCE A WEEK., Disp: , Rfl:  .  SUMAtriptan (IMITREX) 50 MG tablet, Take 1 tablet (50 mg total) by mouth every 2 (two) hours as needed., Disp: 10 tablet, Rfl: 2 .  traMADol (ULTRAM) 50 MG tablet, Take 1 tablet (50 mg total) by mouth every 6 (six) hours as needed., Disp: 30 tablet, Rfl: 0 .  UNIFINE PENTIPS PLUS 31G X 5 MM MISC, Use as directed with Ozempic pen, Disp: 90 each, Rfl: 2 .  valACYclovir (VALTREX) 500 MG tablet, Take 500 mg by mouth 3 (three) times daily., Disp: , Rfl: 0 .  Continuous Blood Gluc Receiver (FREESTYLE LIBRE 14 DAY READER) DEVI, USE AS DIRECTED TO CHECK BLOOD SUGARS FOUR TIMES A DAY, Disp: 3 each, Rfl: 0 .  Continuous Blood Gluc Sensor (FREESTYLE LIBRE 2 SENSOR) MISC, USE AS DIRECTED TO CHECK BLOOD SUGARS FOUR TIMES A DAY, Disp: 3 each, Rfl: 1   Allergies  Allergen Reactions  . Latex Hives     Review of Systems  Constitutional: Negative.   Eyes: Negative for blurred vision.  Respiratory: Negative.   Cardiovascular: Negative.  Negative for chest pain.  Gastrointestinal: Negative.   Musculoskeletal: Positive for arthralgias.  Skin: Positive for color change.       Tick bite   Neurological: Negative.      Today's Vitals   01/14/20 1120  BP: 130/78  Pulse: 80  Temp: 98 F (36.7 C)  TempSrc: Oral  Weight: 187 lb 12.8 oz (85.2 kg)  Height: 5' (1.524 m)   Body mass index is 36.68 kg/m.   Objective:  Physical Exam Vitals and nursing note reviewed.  Constitutional:      Appearance: Normal appearance. She is obese.  HENT:     Head: Normocephalic and atraumatic.  Cardiovascular:     Rate and Rhythm: Normal rate and regular rhythm.     Heart sounds: Normal heart sounds.  Pulmonary:     Effort: Pulmonary effort is normal.     Breath sounds: Normal breath sounds.  Skin:     General: Skin is warm.  Neurological:     General: No focal deficit present.     Mental Status: She is alert.  Psychiatric:        Mood and Affect: Mood normal.        Behavior: Behavior normal.         Assessment And Plan:     1. 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 Will continue with current medication and adjust if needed after reviewing  lab results. Importance of medication and dietary compliance was discussed with the patient.  - Hemoglobin A1c; Future - BMP8+EGFR; Future  2. Migraine without status migrainosus, not intractable, unspecified migraine type Comments: Chronic, intermittent. Encouraged to stay well hydrated. She was given samples of Ubrelvy 37m to use prn. She will let me know if this is effective.   3. Class 2 severe obesity due to excess calories with serious comorbidity and body mass index (BMI) of 36.0 to 36.9 in adult ([facility] She is encouraged to strive for BMI less than 30 to decrease cardiac risk. Advised to aim for at least 150 minutes of exercise per week. Wt Readings from Last 3 Encounters:  01/14/20 187 lb 12.8 oz (85.2 kg)  11/16/19 184 lb (83.5 kg)  10/29/19 184 lb (83.5 kg)       Patient was given opportunity to ask questions. Patient verbalized understanding of the plan and was able to repeat key elements of the plan. All questions were answered to their satisfaction.  RMaximino Greenland MD   I, RMaximino Greenland MD, have reviewed all documentation for this visit. The documentation on 01/24/20 for the exam, diagnosis, procedures, and orders are all accurate and complete.  THE PATIENT IS ENCOURAGED TO PRACTICE SOCIAL DISTANCING DUE TO THE COVID-19 PANDEMIC.

## 2020-01-15 LAB — HEMOGLOBIN A1C
Hgb A1c MFr Bld: 8.2 % — ABNORMAL HIGH (ref 4.8–5.6)
Mean Plasma Glucose: 188.64 mg/dL

## 2020-01-18 ENCOUNTER — Other Ambulatory Visit: Payer: Self-pay

## 2020-01-18 MED ORDER — FREESTYLE LIBRE 14 DAY READER DEVI: 0 refills | Status: DC

## 2020-01-18 MED ORDER — FREESTYLE LIBRE 2 SENSOR MISC: 1 refills | Status: DC

## 2020-01-18 NOTE — Progress Notes (Signed)
I,Michelle Tanner,acting as a Education administrator for Michelle Greenland, MD.,have documented all relevant documentation on the behalf of Michelle Greenland, MD,as directed by  Michelle Greenland, MD while in the presence of Michelle Greenland, MD.  This visit occurred during the SARS-CoV-2 public health emergency.  Safety protocols were in place, including screening questions prior to the visit, additional usage of staff PPE, and extensive cleaning of exam room while observing appropriate contact time as indicated for disinfecting solutions.  Subjective:     Patient ID: Michelle Tanner , female    DOB: [birth year] , 49 y.o.   MRN: [id number]   Chief Complaint  Patient presents with  . Diabetes    HPI  She presents today for dm check. She reports compliance with meds. She is refusing to get the COVID vaccine at this time.   Diabetes She presents for her follow-up diabetic visit. She has type 2 diabetes mellitus. Her disease course has been improving. There are no hypoglycemic associated symptoms. There are no hypoglycemic complications. Risk factors for coronary artery disease include diabetes mellitus, dyslipidemia and hypertension. She never participates in exercise. Her home blood glucose trend is decreasing steadily. An ACE inhibitor/angiotensin II receptor blocker is not being taken.     Past Medical History:  Diagnosis Date  . Back pain   . Diabetes mellitus   . Edema, lower extremity   . Fatty liver    per patient   . Fibromyalgia   . Frozen shoulder    left and right   . Hx of migraine headaches   . Joint pain   . Migraines   . Neuromuscular disorder ([place])    diabetic neuropathy  . Shoulder pain      Family History  Problem Relation Age of Onset  . Rectal cancer Paternal Grandmother   . Diabetes Father   . Kidney disease Father   . CAD Father   . Hyperlipidemia Father   . Hypertension Father   . Heart disease Father   . Sudden death Father   . Cancer Mother   . Cancer Paternal Uncle    . Cancer Paternal Aunt      Current Outpatient Medications:  .  acetaminophen (TYLENOL) 650 MG CR tablet, Take 650 mg by mouth every 8 (eight) hours as needed for pain., Disp: , Rfl:  .  buPROPion (WELLBUTRIN SR) 150 MG 12 hr tablet, Take 1 tablet (150 mg total) by mouth daily., Disp: 30 tablet, Rfl: 0 .  clotrimazole (GYNE-LOTRIMIN 3) 2 % vaginal cream, Gyne-Lotrimin 2 % vaginal cream  Insert 1 applicatorful by vaginal route for 7 days., Disp: , Rfl:  .  dapagliflozin propanediol (FARXIGA) 10 MG TABS tablet, Take 10 mg by mouth daily., Disp: 90 tablet, Rfl: 2 .  diclofenac Sodium (VOLTAREN) 1 % GEL, Apply topically as needed., Disp: , Rfl:  .  folic acid (FOLVITE) 1 MG tablet, Take 1 tablet (1 mg total) by mouth daily., Disp: 90 tablet, Rfl: 1 .  ibuprofen (ADVIL,MOTRIN) 800 MG tablet, Take 800 mg by mouth every 8 (eight) hours as needed. For pain, Disp: , Rfl:  .  insulin degludec (TRESIBA FLEXTOUCH) 200 UNIT/ML FlexTouch Pen, Inject 28 Units into the skin at bedtime. (Patient taking differently: Inject 30 Units into the skin at bedtime. ), Disp: 3 mL, Rfl: 3 .  Iron-FA-B Cmp-C-Biot-Probiotic (FUSION PLUS) CAPS, Take 1 capsule by mouth daily. 1 capsule daily, Disp: 90 capsule, Rfl: 1 .  linaclotide (LINZESS) 145 MCG CAPS  capsule, Take 145 mcg by mouth daily before breakfast., Disp: , Rfl:  .  meloxicam (MOBIC) 7.5 MG tablet, One tab po qd prn, Disp: 30 tablet, Rfl: 0 .  metFORMIN (GLUCOPHAGE) 1000 MG tablet, TAKE 1 TABLET BY MOUTH TWO TIMES A DAY WITH MORNING AND EVENING MEALS, Disp: 180 tablet, Rfl: 0 .  Multiple Vitamin (MULITIVITAMIN WITH MINERALS) TABS, Take 1 tablet by mouth daily., Disp: , Rfl:  .  pregabalin (LYRICA) 150 MG capsule, TAKE 1 CAPSULE (150 MG TOTAL) BY MOUTH 2 (TWO) TIMES DAILY., Disp: 60 capsule, Rfl: 2 .  Semaglutide, 1 MG/DOSE, (OZEMPIC, 1 MG/DOSE,) 2 MG/1.5ML SOPN, Inject 1 mg into the skin once a week., Disp: 9 pen, Rfl: 1 .  Semaglutide, 1 MG/DOSE, (OZEMPIC, 1  MG/DOSE,) 2 MG/1.5ML SOPN, Ozempic 1 mg/dose (2 mg/1.5 mL) subcutaneous pen injector  INJECT 1 MG INTO THE SKIN ONCE A WEEK., Disp: , Rfl:  .  SUMAtriptan (IMITREX) 50 MG tablet, Take 1 tablet (50 mg total) by mouth every 2 (two) hours as needed., Disp: 10 tablet, Rfl: 2 .  traMADol (ULTRAM) 50 MG tablet, Take 1 tablet (50 mg total) by mouth every 6 (six) hours as needed., Disp: 30 tablet, Rfl: 0 .  UNIFINE PENTIPS PLUS 31G X 5 MM MISC, Use as directed with Ozempic pen, Disp: 90 each, Rfl: 2 .  valACYclovir (VALTREX) 500 MG tablet, Take 500 mg by mouth 3 (three) times daily., Disp: , Rfl: 0 .  Continuous Blood Gluc Receiver (FREESTYLE LIBRE 14 DAY READER) DEVI, USE AS DIRECTED TO CHECK BLOOD SUGARS FOUR TIMES A DAY, Disp: 3 each, Rfl: 0 .  Continuous Blood Gluc Sensor (FREESTYLE LIBRE 2 SENSOR) MISC, USE AS DIRECTED TO CHECK BLOOD SUGARS FOUR TIMES A DAY, Disp: 3 each, Rfl: 1   Allergies  Allergen Reactions  . Latex Hives     Review of Systems  Constitutional: Negative.   Respiratory: Negative.   Cardiovascular: Negative.   Gastrointestinal: Negative.   Musculoskeletal: Positive for arthralgias.  Skin: Positive for color change.       Tick bite   Neurological: Negative.      Today's Vitals   01/14/20 1120  BP: 130/78  Pulse: 80  Temp: 98 F (36.7 C)  TempSrc: Oral  Weight: 187 lb 12.8 oz (85.2 kg)  Height: 5' (1.524 m)   Body mass index is 36.68 kg/m.   Objective:  Physical Exam Vitals and nursing note reviewed.  Constitutional:      Appearance: Normal appearance. She is obese.  HENT:     Head: Normocephalic and atraumatic.  Eyes:     Extraocular Movements: Extraocular movements intact.  Cardiovascular:     Rate and Rhythm: Normal rate and regular rhythm.     Heart sounds: Normal heart sounds.  Pulmonary:     Effort: Pulmonary effort is normal.     Breath sounds: Normal breath sounds.  Skin:    General: Skin is warm.  Neurological:     General: No focal deficit  present.     Mental Status: She is alert.  Psychiatric:        Mood and Affect: Mood normal.        Behavior: Behavior normal.         Assessment And Plan:     1. 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 Will continue with current medication and adjust if needed after reviewing lab results. Importance of medication and dietary compliance was discussed with  the patient.  - Hemoglobin A1c; Future - BMP8+EGFR; Future  2. Migraine without status migrainosus, not intractable, unspecified migraine type Comments: Chronic, intermittent. Encouraged to stay well hydrated. She was given samples of Ubrelvy 59m to use prn. She will let me know if this is effective.   3. Class 2 severe obesity due to excess calories with serious comorbidity and body mass index (BMI) of 36.0 to 36.9 in adult ([facility] She is encouraged to strive for BMI less than 30 to decrease cardiac risk. Advised to aim for at least 150 minutes of exercise per week. Wt Readings from Last 3 Encounters:  01/14/20 187 lb 12.8 oz (85.2 kg)  11/16/19 184 lb (83.5 kg)  10/29/19 184 lb (83.5 kg)       Patient was given opportunity to ask questions. Patient verbalized understanding of the plan and was able to repeat key elements of the plan. All questions were answered to their satisfaction.  RMaximino Greenland MD   I, RMaximino Greenland MD, have reviewed all documentation for this visit. The documentation on 01/24/20 for the exam, diagnosis, procedures, and orders are all accurate and complete.  THE PATIENT IS ENCOURAGED TO PRACTICE SOCIAL DISTANCING DUE TO THE COVID-19 PANDEMIC.

## 2020-01-20 ENCOUNTER — Telehealth: Payer: Self-pay

## 2020-01-20 NOTE — Telephone Encounter (Signed)
I left the pt a message that I was returning her call.  The pt left a message that she was returning a call to answer some questions that Dr sanders had for her.

## 2020-01-20 NOTE — Telephone Encounter (Signed)
lvm for pt to return call. Need to know if she will switch to dexcom

## 2020-01-20 NOTE — Telephone Encounter (Signed)
I returned the pt's call to the number she left on the voicemail [phone] and was told no one is at that number with that name so I called and Left a message on the pt's cell number that I was returning her call and that I tried the contact number that she left first.

## 2020-01-21 DIAGNOSIS — E113293 Type 2 diabetes mellitus with mild nonproliferative diabetic retinopathy without macular edema, bilateral: Secondary | ICD-10-CM | POA: Diagnosis not present

## 2020-01-21 LAB — HM DIABETES EYE EXAM

## 2020-01-22 ENCOUNTER — Other Ambulatory Visit: Payer: Self-pay | Admitting: Internal Medicine

## 2020-01-24 NOTE — Progress Notes (Signed)
I,[name],acting as a Education administrator for [name], MD.,have documented all relevant documentation on the behalf of [name], MD,as directed by  [name], MD while in the presence of [name], MD.  This visit occurred during the SARS-CoV-2 public health emergency.  Safety protocols were in place, including screening questions prior to the visit, additional usage of staff PPE, and extensive cleaning of exam room while observing appropriate contact time as indicated for disinfecting solutions.  Subjective:     Patient ID: Michelle Tanner , female    DOB: [birth year] , 49 y.o.   MRN: [id number]   Chief Complaint  Patient presents with  . Diabetes    HPI  She presents today for dm check. She reports compliance with meds. She is refusing to get the COVID vaccine at this time.   Diabetes She presents for her follow-up diabetic visit. She has type 2 diabetes mellitus. Her disease course has been improving. There are no hypoglycemic associated symptoms. Pertinent negatives for diabetes include no blurred vision and no chest pain. There are no hypoglycemic complications. Risk factors for coronary artery disease include diabetes mellitus, dyslipidemia and hypertension. She never participates in exercise. Her home blood glucose trend is decreasing steadily. An ACE inhibitor/angiotensin II receptor blocker is not being taken.     Past Medical History:  Diagnosis Date  . Back pain   . Diabetes mellitus   . Edema, lower extremity   . Fatty liver    per patient   . Fibromyalgia   . Frozen shoulder    left and right   . Hx of migraine headaches   . Joint pain   . Migraines   . Neuromuscular disorder ([place])    diabetic neuropathy  . Shoulder pain      Family History  Problem Relation Age of Onset  . Rectal cancer Paternal Grandmother   . Diabetes Father   . Kidney disease Father   . CAD Father   . Hyperlipidemia Father   . Hypertension Father   . Heart disease  Father   . Sudden death Father   . Cancer Mother   . Cancer Paternal Uncle   . Cancer Paternal Aunt      Current Outpatient Medications:  .  acetaminophen (TYLENOL) 650 MG CR tablet, Take 650 mg by mouth every 8 (eight) hours as needed for pain., Disp: , Rfl:  .  buPROPion (WELLBUTRIN SR) 150 MG 12 hr tablet, Take 1 tablet (150 mg total) by mouth daily., Disp: 30 tablet, Rfl: 0 .  clotrimazole (GYNE-LOTRIMIN 3) 2 % vaginal cream, Gyne-Lotrimin 2 % vaginal cream  Insert 1 applicatorful by vaginal route for 7 days., Disp: , Rfl:  .  dapagliflozin propanediol (FARXIGA) 10 MG TABS tablet, Take 10 mg by mouth daily., Disp: 90 tablet, Rfl: 2 .  diclofenac Sodium (VOLTAREN) 1 % GEL, Apply topically as needed., Disp: , Rfl:  .  folic acid (FOLVITE) 1 MG tablet, Take 1 tablet (1 mg total) by mouth daily., Disp: 90 tablet, Rfl: 1 .  ibuprofen (ADVIL,MOTRIN) 800 MG tablet, Take 800 mg by mouth every 8 (eight) hours as needed. For pain, Disp: , Rfl:  .  insulin degludec (TRESIBA FLEXTOUCH) 200 UNIT/ML FlexTouch Pen, Inject 28 Units into the skin at bedtime. (Patient taking differently: Inject 30 Units into the skin at bedtime. ), Disp: 3 mL, Rfl: 3 .  Iron-FA-B Cmp-C-Biot-Probiotic (FUSION PLUS) CAPS, Take 1 capsule by mouth daily. 1 capsule daily,  Disp: 90 capsule, Rfl: 1 .  linaclotide (LINZESS) 145 MCG CAPS capsule, Take 145 mcg by mouth daily before breakfast., Disp: , Rfl:  .  meloxicam (MOBIC) 7.5 MG tablet, One tab po qd prn, Disp: 30 tablet, Rfl: 0 .  metFORMIN (GLUCOPHAGE) 1000 MG tablet, TAKE 1 TABLET BY MOUTH TWO TIMES A DAY WITH MORNING AND EVENING MEALS, Disp: 180 tablet, Rfl: 0 .  Multiple Vitamin (MULITIVITAMIN WITH MINERALS) TABS, Take 1 tablet by mouth daily., Disp: , Rfl:  .  pregabalin (LYRICA) 150 MG capsule, TAKE 1 CAPSULE (150 MG TOTAL) BY MOUTH 2 (TWO) TIMES DAILY., Disp: 60 capsule, Rfl: 2 .  Semaglutide, 1 MG/DOSE, (OZEMPIC, 1 MG/DOSE,) 2 MG/1.5ML SOPN, Inject 1 mg into the skin  once a week., Disp: 9 pen, Rfl: 1 .  Semaglutide, 1 MG/DOSE, (OZEMPIC, 1 MG/DOSE,) 2 MG/1.5ML SOPN, Ozempic 1 mg/dose (2 mg/1.5 mL) subcutaneous pen injector  INJECT 1 MG INTO THE SKIN ONCE A WEEK., Disp: , Rfl:  .  SUMAtriptan (IMITREX) 50 MG tablet, Take 1 tablet (50 mg total) by mouth every 2 (two) hours as needed., Disp: 10 tablet, Rfl: 2 .  traMADol (ULTRAM) 50 MG tablet, Take 1 tablet (50 mg total) by mouth every 6 (six) hours as needed., Disp: 30 tablet, Rfl: 0 .  UNIFINE PENTIPS PLUS 31G X 5 MM MISC, Use as directed with Ozempic pen, Disp: 90 each, Rfl: 2 .  valACYclovir (VALTREX) 500 MG tablet, Take 500 mg by mouth 3 (three) times daily., Disp: , Rfl: 0 .  Continuous Blood Gluc Receiver (FREESTYLE LIBRE 14 DAY READER) DEVI, USE AS DIRECTED TO CHECK BLOOD SUGARS FOUR TIMES A DAY, Disp: 3 each, Rfl: 0 .  Continuous Blood Gluc Sensor (FREESTYLE LIBRE 2 SENSOR) MISC, USE AS DIRECTED TO CHECK BLOOD SUGARS FOUR TIMES A DAY, Disp: 3 each, Rfl: 1   Allergies  Allergen Reactions  . Latex Hives     Review of Systems  Constitutional: Negative.   Eyes: Negative for blurred vision.  Respiratory: Negative.   Cardiovascular: Negative.  Negative for chest pain.  Gastrointestinal: Negative.   Musculoskeletal: Positive for arthralgias.  Skin: Positive for color change.       Tick bite   Neurological: Negative.      Today's Vitals   01/14/20 1120  BP: 130/78  Pulse: 80  Temp: 98 F (36.7 C)  TempSrc: Oral  Weight: 187 lb 12.8 oz (85.2 kg)  Height: 5' (1.524 m)   Body mass index is 36.68 kg/m.   Objective:  Physical Exam Vitals and nursing note reviewed.  Constitutional:      Appearance: Normal appearance. She is obese.  HENT:     Head: Normocephalic and atraumatic.  Cardiovascular:     Rate and Rhythm: Normal rate and regular rhythm.     Heart sounds: Normal heart sounds.  Pulmonary:     Effort: Pulmonary effort is normal.     Breath sounds: Normal breath sounds.  Skin:     General: Skin is warm.  Neurological:     General: No focal deficit present.     Mental Status: She is alert.  Psychiatric:        Mood and Affect: Mood normal.        Behavior: Behavior normal.         Assessment And Plan:     1. 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 Will continue with current medication and adjust if needed after reviewing  lab results. Importance of medication and dietary compliance was discussed with the patient.  - Hemoglobin A1c; Future - BMP8+EGFR; Future  2. Migraine without status migrainosus, not intractable, unspecified migraine type Comments: Chronic, intermittent. Encouraged to stay well hydrated. She was given samples of Ubrelvy 37m to use prn. She will let me know if this is effective.   3. Class 2 severe obesity due to excess calories with serious comorbidity and body mass index (BMI) of 36.0 to 36.9 in adult ([facility] She is encouraged to strive for BMI less than 30 to decrease cardiac risk. Advised to aim for at least 150 minutes of exercise per week. Wt Readings from Last 3 Encounters:  01/14/20 187 lb 12.8 oz (85.2 kg)  11/16/19 184 lb (83.5 kg)  10/29/19 184 lb (83.5 kg)       Patient was given opportunity to ask questions. Patient verbalized understanding of the plan and was able to repeat key elements of the plan. All questions were answered to their satisfaction.  RMaximino Greenland MD   I, RMaximino Greenland MD, have reviewed all documentation for this visit. The documentation on 01/24/20 for the exam, diagnosis, procedures, and orders are all accurate and complete.  THE PATIENT IS ENCOURAGED TO PRACTICE SOCIAL DISTANCING DUE TO THE COVID-19 PANDEMIC.

## 2020-01-26 MED FILL — AMOXICILLIN 875 MG TABS: 875 | 7 days supply | Qty: 14 | Fill #0

## 2020-01-26 NOTE — Telephone Encounter (Signed)
Pregabalin refill

## 2020-01-27 MED FILL — FREESTYLE LIBRE 2 SENSOR MI: 28 days supply | Qty: 2 | Fill #0

## 2020-01-28 ENCOUNTER — Telehealth: Payer: Self-pay

## 2020-01-28 MED FILL — UNIFINE PENTIPS 31GX3/16: 31G X 5 MM | 90 days supply | Qty: 100 | Fill #1

## 2020-01-28 MED FILL — PREGABALIN 150 MG CAPS: 150 | 30 days supply | Qty: 60 | Fill #0

## 2020-01-28 NOTE — Telephone Encounter (Signed)
The pt was notified that  Dr. Baird Cancer said that [place] may not do surgery until the pt's a1c is below 7.5%.  The pt said that she already knew that they wouldn't.

## 2020-01-29 DIAGNOSIS — M9903 Segmental and somatic dysfunction of lumbar region: Secondary | ICD-10-CM | POA: Diagnosis not present

## 2020-01-29 DIAGNOSIS — M531 Cervicobrachial syndrome: Secondary | ICD-10-CM | POA: Diagnosis not present

## 2020-01-29 DIAGNOSIS — M9902 Segmental and somatic dysfunction of thoracic region: Secondary | ICD-10-CM | POA: Diagnosis not present

## 2020-01-29 DIAGNOSIS — M9901 Segmental and somatic dysfunction of cervical region: Secondary | ICD-10-CM | POA: Diagnosis not present

## 2020-01-29 DIAGNOSIS — M9905 Segmental and somatic dysfunction of pelvic region: Secondary | ICD-10-CM | POA: Diagnosis not present

## 2020-01-29 DIAGNOSIS — M62830 Muscle spasm of back: Secondary | ICD-10-CM | POA: Diagnosis not present

## 2020-02-03 ENCOUNTER — Other Ambulatory Visit: Payer: Self-pay

## 2020-02-03 ENCOUNTER — Telehealth: Payer: Self-pay

## 2020-02-03 DIAGNOSIS — Z20822 Contact with and (suspected) exposure to covid-19: Secondary | ICD-10-CM | POA: Diagnosis not present

## 2020-02-03 DIAGNOSIS — Z03818 Encounter for observation for suspected exposure to other biological agents ruled out: Secondary | ICD-10-CM | POA: Diagnosis not present

## 2020-02-03 NOTE — Telephone Encounter (Signed)
I left the pt a message that I was calling about the rapid covid test that she requests to have ordered.  The office is having trouble finding the order number for the rapid covid test that the pt is requesting. I was calling to see if the pt has the test number or to recommend that she tried [facility] or the [facility].

## 2020-02-03 NOTE — Telephone Encounter (Signed)
The pt was notified that her flma forms was complete.  The pt said yes to fax to [employer] when asked.  The forms were faxed and emailed to the pt's email on file.

## 2020-02-04 ENCOUNTER — Telehealth: Payer: Self-pay

## 2020-02-04 NOTE — Telephone Encounter (Signed)
The pt was notified that Dr. Baird Cancer wanted to know if she had asked Dr/ Baird Cancer about being out of work? Why was the pt out for 1 week.  The pt said she was out for 1 wk because of her migraines, no she didn't say anything to Dr. Baird Cancer about being out of work and that she came for an appt Aug 26th and was out of work from Aug 19 - 22 nd and that she had volunteered to work the 19th which was suppose to have been her day but she couldn't work because she was having migraine headaches.

## 2020-02-12 ENCOUNTER — Encounter (HOSPITAL_BASED_OUTPATIENT_CLINIC_OR_DEPARTMENT_OTHER): Payer: Self-pay | Admitting: General Surgery

## 2020-02-12 ENCOUNTER — Other Ambulatory Visit: Payer: Self-pay

## 2020-02-12 MED FILL — TRESIBA FLEXTOUCH 200 UNITS: 200 | 23 days supply | Qty: 9 | Fill #1

## 2020-02-13 ENCOUNTER — Inpatient Hospital Stay (HOSPITAL_COMMUNITY): Admission: RE | Admit: 2020-02-13 | Payer: 59 | Source: Ambulatory Visit

## 2020-02-15 ENCOUNTER — Encounter (HOSPITAL_BASED_OUTPATIENT_CLINIC_OR_DEPARTMENT_OTHER)
Admission: RE | Admit: 2020-02-15 | Discharge: 2020-02-15 | Disposition: A | Discharge status: Home / Self Care | Payer: 59 | Source: Ambulatory Visit | Attending: General Surgery | Admitting: General Surgery

## 2020-02-15 ENCOUNTER — Other Ambulatory Visit (HOSPITAL_COMMUNITY)
Admission: RE | Admit: 2020-02-15 | Discharge: 2020-02-15 | Disposition: A | Discharge status: Home / Self Care | Payer: 59 | Source: Ambulatory Visit | Attending: General Surgery | Admitting: General Surgery

## 2020-02-15 DIAGNOSIS — L0501 Pilonidal cyst with abscess: Secondary | ICD-10-CM | POA: Diagnosis not present

## 2020-02-15 DIAGNOSIS — Z20822 Contact with and (suspected) exposure to covid-19: Secondary | ICD-10-CM | POA: Diagnosis not present

## 2020-02-15 DIAGNOSIS — E119 Type 2 diabetes mellitus without complications: Secondary | ICD-10-CM | POA: Diagnosis not present

## 2020-02-15 DIAGNOSIS — Z01812 Encounter for preprocedural laboratory examination: Secondary | ICD-10-CM | POA: Insufficient documentation

## 2020-02-15 DIAGNOSIS — Z7984 Long term (current) use of oral hypoglycemic drugs: Secondary | ICD-10-CM | POA: Diagnosis not present

## 2020-02-15 DIAGNOSIS — L720 Epidermal cyst: Secondary | ICD-10-CM | POA: Diagnosis not present

## 2020-02-15 DIAGNOSIS — M797 Fibromyalgia: Secondary | ICD-10-CM | POA: Diagnosis not present

## 2020-02-15 LAB — SARS CORONAVIRUS 2 (TAT 6-24 HRS): SARS Coronavirus 2: NEGATIVE

## 2020-02-15 LAB — BASIC METABOLIC PANEL
Anion gap: 14 (ref 5–15)
BUN: 10 mg/dL (ref 6–20)
CO2: 22 mmol/L (ref 22–32)
Calcium: 9.2 mg/dL (ref 8.9–10.3)
Chloride: 98 mmol/L (ref 98–111)
Creatinine, Ser: 0.69 mg/dL (ref 0.44–1.00)
GFR calc Af Amer: >60 mL/min (ref >60)
GFR calc non Af Amer: >60 mL/min (ref >60)
Glucose, Bld: 342 mg/dL — ABNORMAL HIGH (ref 70–99)
Potassium: 4.4 mmol/L (ref 3.5–5.1)
Sodium: 134 mmol/L — ABNORMAL LOW (ref 135–145)

## 2020-02-15 NOTE — Progress Notes (Signed)
Blood glucose-342, notified Dr. Ethlyn Gallery office, Dr. Smith Robert aware, will recheck day of surgery.

## 2020-02-15 NOTE — Progress Notes (Signed)

## 2020-02-16 NOTE — Anesthesia Preprocedure Evaluation (Addendum)
Anesthesia Evaluation  Patient identified by MRN, date of birth, ID band Patient awake    Reviewed: Allergy & Precautions, NPO status , Patient's Chart, lab work & pertinent test results  Airway Mallampati: I  TM Distance: >3 FB Neck ROM: Full    Dental no notable dental hx. (+) Teeth Intact, Dental Advisory Given, Poor Dentition, Chipped, Missing,    Pulmonary neg pulmonary ROS,    Pulmonary exam normal breath sounds clear to auscultation       Cardiovascular negative cardio ROS Normal cardiovascular exam Rhythm:Regular Rate:Normal     Neuro/Psych  Headaches,  Neuromuscular disease negative neurological ROS  negative psych ROS   GI/Hepatic negative GI ROS, Neg liver ROS,   Endo/Other  diabetes, Type 2, Oral Hypoglycemic Agents  Renal/GU negative Renal ROS  negative genitourinary   Musculoskeletal negative musculoskeletal ROS (+) Fibromyalgia -  Abdominal   Peds negative pediatric ROS (+)  Hematology  (+) Blood dyscrasia, anemia ,   Anesthesia Other Findings Duodenal lesion  Reproductive/Obstetrics negative OB ROS                            Anesthesia Physical  Anesthesia Plan  ASA: III  Anesthesia Plan: General   Post-op Pain Management:    Induction: Intravenous  PONV Risk Score and Plan: Treatment may vary due to age or medical condition  Airway Management Planned: Oral ETT and LMA  Additional Equipment: None  Intra-op Plan:   Post-operative Plan: Extubation in OR  Informed Consent: I have reviewed the patients History and Physical, chart, labs and discussed the procedure including the risks, benefits and alternatives for the proposed anesthesia with the patient or authorized representative who has indicated his/her understanding and acceptance.     Dental advisory given  Plan Discussed with: CRNA and Anesthesiologist  Anesthesia Plan Comments: (  )        Anesthesia Quick Evaluation

## 2020-02-17 ENCOUNTER — Ambulatory Visit (HOSPITAL_BASED_OUTPATIENT_CLINIC_OR_DEPARTMENT_OTHER): Payer: 59 | Admitting: Anesthesiology

## 2020-02-17 ENCOUNTER — Ambulatory Visit (HOSPITAL_BASED_OUTPATIENT_CLINIC_OR_DEPARTMENT_OTHER)
Admission: RE | Admit: 2020-02-17 | Discharge: 2020-02-17 | Disposition: A | Discharge status: Home / Self Care | Payer: 59 | Attending: General Surgery | Admitting: General Surgery

## 2020-02-17 ENCOUNTER — Encounter (HOSPITAL_BASED_OUTPATIENT_CLINIC_OR_DEPARTMENT_OTHER): Payer: Self-pay | Admitting: General Surgery

## 2020-02-17 ENCOUNTER — Other Ambulatory Visit: Payer: Self-pay

## 2020-02-17 ENCOUNTER — Encounter (HOSPITAL_BASED_OUTPATIENT_CLINIC_OR_DEPARTMENT_OTHER)
Admission: RE | Disposition: A | Discharge status: Home / Self Care | Payer: Self-pay | Source: Home / Self Care | Attending: General Surgery

## 2020-02-17 DIAGNOSIS — L0501 Pilonidal cyst with abscess: Secondary | ICD-10-CM | POA: Diagnosis not present

## 2020-02-17 DIAGNOSIS — E119 Type 2 diabetes mellitus without complications: Secondary | ICD-10-CM | POA: Insufficient documentation

## 2020-02-17 DIAGNOSIS — Z7984 Long term (current) use of oral hypoglycemic drugs: Secondary | ICD-10-CM | POA: Insufficient documentation

## 2020-02-17 DIAGNOSIS — M797 Fibromyalgia: Secondary | ICD-10-CM | POA: Insufficient documentation

## 2020-02-17 DIAGNOSIS — L720 Epidermal cyst: Secondary | ICD-10-CM | POA: Insufficient documentation

## 2020-02-17 DIAGNOSIS — D171 Benign lipomatous neoplasm of skin and subcutaneous tissue of trunk: Secondary | ICD-10-CM | POA: Diagnosis not present

## 2020-02-17 DIAGNOSIS — D500 Iron deficiency anemia secondary to blood loss (chronic): Secondary | ICD-10-CM | POA: Diagnosis not present

## 2020-02-17 DIAGNOSIS — N946 Dysmenorrhea, unspecified: Secondary | ICD-10-CM | POA: Diagnosis not present

## 2020-02-17 DIAGNOSIS — L729 Follicular cyst of the skin and subcutaneous tissue, unspecified: Secondary | ICD-10-CM | POA: Diagnosis not present

## 2020-02-17 HISTORY — PX: INCISION AND DRAINAGE ABSCESS: SHX5864

## 2020-02-17 HISTORY — PX: CYST EXCISION: SHX5701

## 2020-02-17 LAB — GLUCOSE, CAPILLARY
Glucose-Capillary: 152 mg/dL — ABNORMAL HIGH (ref 70–99)
Glucose-Capillary: 151 mg/dL — ABNORMAL HIGH (ref 70–99)

## 2020-02-17 LAB — POCT PREGNANCY, URINE: Preg Test, Ur: NEGATIVE

## 2020-02-17 SURGERY — INCISION AND DRAINAGE, ABSCESS
Anesthesia: General | Site: Neck | Laterality: Right

## 2020-02-17 MED ORDER — CEFAZOLIN SODIUM-DEXTROSE 2-4 GM/100ML-% IV SOLN
2.0000 g | INTRAVENOUS | Status: AC
  Administered 2020-02-17: 2 g via INTRAVENOUS

## 2020-02-17 MED ORDER — ONDANSETRON HCL 4 MG/2ML IJ SOLN
INTRAMUSCULAR | Status: DC | PRN
  Administered 2020-02-17: 4 mg via INTRAVENOUS

## 2020-02-17 MED ORDER — OXYCODONE HCL 5 MG/5ML PO SOLN: 5.0000 mg | Freq: Once | ORAL | Status: DC | PRN

## 2020-02-17 MED ORDER — CEFAZOLIN SODIUM-DEXTROSE 2-4 GM/100ML-% IV SOLN
INTRAVENOUS | Status: AC
  Filled 2020-02-17: qty 100

## 2020-02-17 MED ORDER — ACETAMINOPHEN 160 MG/5ML PO SOLN: 325.0000 mg | ORAL | Status: DC | PRN

## 2020-02-17 MED ORDER — CHLORHEXIDINE GLUCONATE CLOTH 2 % EX PADS: 6.0000 | MEDICATED_PAD | Freq: Once | CUTANEOUS | Status: DC

## 2020-02-17 MED ORDER — PROPOFOL 10 MG/ML IV BOLUS
INTRAVENOUS | Status: AC
  Filled 2020-02-17: qty 20

## 2020-02-17 MED ORDER — LACTATED RINGERS IV SOLN: INTRAVENOUS | Status: DC

## 2020-02-17 MED ORDER — ONDANSETRON HCL 4 MG/2ML IJ SOLN: 4.0000 mg | Freq: Once | INTRAMUSCULAR | Status: DC | PRN

## 2020-02-17 MED ORDER — ACETAMINOPHEN 325 MG PO TABS: 325.0000 mg | ORAL_TABLET | ORAL | Status: DC | PRN

## 2020-02-17 MED ORDER — LIDOCAINE 2% (20 MG/ML) 5 ML SYRINGE
INTRAMUSCULAR | Status: AC
  Filled 2020-02-17: qty 5

## 2020-02-17 MED ORDER — FENTANYL CITRATE (PF) 100 MCG/2ML IJ SOLN
INTRAMUSCULAR | Status: AC
  Filled 2020-02-17: qty 2

## 2020-02-17 MED ORDER — PHENYLEPHRINE 40 MCG/ML (10ML) SYRINGE FOR IV PUSH (FOR BLOOD PRESSURE SUPPORT)
PREFILLED_SYRINGE | INTRAVENOUS | Status: DC | PRN
  Administered 2020-02-17 (×3): 120 ug via INTRAVENOUS

## 2020-02-17 MED ORDER — CELECOXIB 200 MG PO CAPS
ORAL_CAPSULE | ORAL | Status: AC
  Filled 2020-02-17: qty 1

## 2020-02-17 MED ORDER — FENTANYL CITRATE (PF) 100 MCG/2ML IJ SOLN: 25.0000 ug | INTRAMUSCULAR | Status: DC | PRN

## 2020-02-17 MED ORDER — DEXAMETHASONE SODIUM PHOSPHATE 10 MG/ML IJ SOLN
INTRAMUSCULAR | Status: DC | PRN
  Administered 2020-02-17: 4 mg via INTRAVENOUS

## 2020-02-17 MED ORDER — LIDOCAINE 2% (20 MG/ML) 5 ML SYRINGE
INTRAMUSCULAR | Status: DC | PRN
  Administered 2020-02-17: 50 mg via INTRAVENOUS

## 2020-02-17 MED ORDER — MEPERIDINE HCL 25 MG/ML IJ SOLN: 6.2500 mg | INTRAMUSCULAR | Status: DC | PRN

## 2020-02-17 MED ORDER — FENTANYL CITRATE (PF) 100 MCG/2ML IJ SOLN
INTRAMUSCULAR | Status: DC | PRN
Start: 2020-02-17 — End: 2020-02-17
  Administered 2020-02-17: 50 ug via INTRAVENOUS

## 2020-02-17 MED ORDER — GABAPENTIN 300 MG PO CAPS
ORAL_CAPSULE | ORAL | Status: AC
  Filled 2020-02-17: qty 1

## 2020-02-17 MED ORDER — ROCURONIUM BROMIDE 10 MG/ML (PF) SYRINGE
PREFILLED_SYRINGE | INTRAVENOUS | Status: AC
  Filled 2020-02-17: qty 10

## 2020-02-17 MED ORDER — MIDAZOLAM HCL 2 MG/2ML IJ SOLN
INTRAMUSCULAR | Status: DC | PRN
  Administered 2020-02-17: 2 mg via INTRAVENOUS

## 2020-02-17 MED ORDER — BUPIVACAINE HCL (PF) 0.25 % IJ SOLN
INTRAMUSCULAR | Status: AC
  Filled 2020-02-17: qty 30

## 2020-02-17 MED ORDER — PROPOFOL 10 MG/ML IV BOLUS
INTRAVENOUS | Status: DC | PRN
  Administered 2020-02-17: 200 mg via INTRAVENOUS

## 2020-02-17 MED ORDER — SUGAMMADEX SODIUM 200 MG/2ML IV SOLN
INTRAVENOUS | Status: DC | PRN
  Administered 2020-02-17: 200 mg via INTRAVENOUS

## 2020-02-17 MED ORDER — ACETAMINOPHEN 500 MG PO TABS
1000.0000 mg | ORAL_TABLET | ORAL | Status: AC
  Administered 2020-02-17: 1000 mg via ORAL

## 2020-02-17 MED ORDER — DEXAMETHASONE SODIUM PHOSPHATE 10 MG/ML IJ SOLN
INTRAMUSCULAR | Status: AC
  Filled 2020-02-17: qty 1

## 2020-02-17 MED ORDER — ACETAMINOPHEN 500 MG PO TABS
ORAL_TABLET | ORAL | Status: AC
  Filled 2020-02-17: qty 2

## 2020-02-17 MED ORDER — GABAPENTIN 300 MG PO CAPS
300.0000 mg | ORAL_CAPSULE | ORAL | Status: AC
  Administered 2020-02-17: 300 mg via ORAL

## 2020-02-17 MED ORDER — ROCURONIUM BROMIDE 10 MG/ML (PF) SYRINGE
PREFILLED_SYRINGE | INTRAVENOUS | Status: DC | PRN
  Administered 2020-02-17: 50 mg via INTRAVENOUS

## 2020-02-17 MED ORDER — BUPIVACAINE HCL (PF) 0.25 % IJ SOLN
INTRAMUSCULAR | Status: DC | PRN
  Administered 2020-02-17: 5 mL
  Administered 2020-02-17 (×2): 10 mL

## 2020-02-17 MED ORDER — OXYCODONE HCL 5 MG PO TABS: 5.0000 mg | ORAL_TABLET | Freq: Once | ORAL | Status: DC | PRN

## 2020-02-17 MED ORDER — HYDROCODONE-ACETAMINOPHEN 5-325 MG PO TABS: 1.0000 | ORAL_TABLET | Freq: Four times a day (QID) | ORAL | 0 refills | Status: DC | PRN

## 2020-02-17 MED ORDER — ONDANSETRON HCL 4 MG/2ML IJ SOLN
INTRAMUSCULAR | Status: AC
  Filled 2020-02-17: qty 2

## 2020-02-17 MED ORDER — MIDAZOLAM HCL 2 MG/2ML IJ SOLN
INTRAMUSCULAR | Status: AC
  Filled 2020-02-17: qty 2

## 2020-02-17 MED ORDER — CELECOXIB 200 MG PO CAPS
200.0000 mg | ORAL_CAPSULE | ORAL | Status: AC
  Administered 2020-02-17: 200 mg via ORAL

## 2020-02-17 MED FILL — HYDROCODON-APAP 5-325: 5-325 | 2 days supply | Qty: 15 | Fill #0

## 2020-02-17 MED FILL — OZEMPIC (1 MG/DOSE) 4 MG/3M: 4 | 84 days supply | Qty: 9 | Fill #0

## 2020-02-17 SURGICAL SUPPLY — 65 items
ADH SKN CLS APL DERMABOND .7 (GAUZE/BANDAGES/DRESSINGS) ×4
APL PRP STRL LF DISP 70% ISPRP (MISCELLANEOUS) ×4
APL SKNCLS STERI-STRIP NONHPOA (GAUZE/BANDAGES/DRESSINGS)
BENZOIN TINCTURE PRP APPL 2/3 (GAUZE/BANDAGES/DRESSINGS) IMPLANT
BLADE CLIPPER SURG (BLADE) ×4 IMPLANT
BLADE SURG 10 STRL SS (BLADE) ×4 IMPLANT
BLADE SURG 15 STRL LF DISP TIS (BLADE) ×2 IMPLANT
BLADE SURG 15 STRL SS (BLADE) ×4
BNDG GAUZE ELAST 4 BULKY (GAUZE/BANDAGES/DRESSINGS) IMPLANT
CANISTER SUCT 1200ML W/VALVE (MISCELLANEOUS) ×4 IMPLANT
CHLORAPREP W/TINT 26 (MISCELLANEOUS) ×8 IMPLANT
CLEANER CAUTERY TIP 5X5 PAD (MISCELLANEOUS) IMPLANT
CLOSURE WOUND 1/2 X4 (GAUZE/BANDAGES/DRESSINGS) ×1
CLOSURE WOUND 1/4X4 (GAUZE/BANDAGES/DRESSINGS)
COVER BACK TABLE 60X90IN (DRAPES) ×4 IMPLANT
COVER MAYO STAND STRL (DRAPES) ×4 IMPLANT
COVER WAND RF STERILE (DRAPES) IMPLANT
DECANTER SPIKE VIAL GLASS SM (MISCELLANEOUS) ×4 IMPLANT
DERMABOND ADVANCED (GAUZE/BANDAGES/DRESSINGS) ×4
DERMABOND ADVANCED .7 DNX12 (GAUZE/BANDAGES/DRESSINGS) ×4 IMPLANT
DRAPE LAPAROTOMY 100X72 PEDS (DRAPES) ×4 IMPLANT
DRAPE LAPAROTOMY T 102X78X121 (DRAPES) ×4 IMPLANT
DRAPE UTILITY XL STRL (DRAPES) ×8 IMPLANT
DRSG PAD ABDOMINAL 8X10 ST (GAUZE/BANDAGES/DRESSINGS) ×4 IMPLANT
DRSG TEGADERM 4X4.75 (GAUZE/BANDAGES/DRESSINGS) IMPLANT
ELECT REM PT RETURN 9FT ADLT (ELECTROSURGICAL) ×4
ELECTRODE REM PT RTRN 9FT ADLT (ELECTROSURGICAL) ×2 IMPLANT
GAUZE 4X4 16PLY RFD (DISPOSABLE) ×4 IMPLANT
GAUZE SPONGE 4X4 12PLY STRL LF (GAUZE/BANDAGES/DRESSINGS) ×8 IMPLANT
GLOVE BIO SURGEON STRL SZ7.5 (GLOVE) IMPLANT
GLOVE SURG SS PI 6.0 STRL IVOR (GLOVE) ×8 IMPLANT
GOWN STRL REUS W/ TWL LRG LVL3 (GOWN DISPOSABLE) ×4 IMPLANT
GOWN STRL REUS W/TWL LRG LVL3 (GOWN DISPOSABLE) ×8
NEEDLE HYPO 25X1 1.5 SAFETY (NEEDLE) ×4 IMPLANT
NS IRRIG 1000ML POUR BTL (IV SOLUTION) ×4 IMPLANT
PACK BASIN DAY SURGERY FS (CUSTOM PROCEDURE TRAY) ×4 IMPLANT
PAD CLEANER CAUTERY TIP 5X5 (MISCELLANEOUS)
PENCIL SMOKE EVACUATOR (MISCELLANEOUS) ×4 IMPLANT
SPONGE GAUZE 2X2 8PLY STER LF (GAUZE/BANDAGES/DRESSINGS)
SPONGE GAUZE 2X2 8PLY STRL LF (GAUZE/BANDAGES/DRESSINGS) IMPLANT
SPONGE LAP 18X18 RF (DISPOSABLE) ×4 IMPLANT
STRIP CLOSURE SKIN 1/2X4 (GAUZE/BANDAGES/DRESSINGS) ×3 IMPLANT
STRIP CLOSURE SKIN 1/4X4 (GAUZE/BANDAGES/DRESSINGS) IMPLANT
SUT CHROMIC 3 0 SH 27 (SUTURE) IMPLANT
SUT ETHILON 3 0 PS 1 (SUTURE) IMPLANT
SUT MON AB 4-0 PC3 18 (SUTURE) ×4 IMPLANT
SUT PROLENE 3 0 PS 2 (SUTURE) IMPLANT
SUT PROLENE 4 0 PS 2 18 (SUTURE) IMPLANT
SUT SILK 2 0 PERMA HAND 18 BK (SUTURE) IMPLANT
SUT VIC AB 3-0 54X BRD REEL (SUTURE) IMPLANT
SUT VIC AB 3-0 BRD 54 (SUTURE)
SUT VIC AB 3-0 FS2 27 (SUTURE) IMPLANT
SUT VIC AB 3-0 SH 27 (SUTURE)
SUT VIC AB 3-0 SH 27X BRD (SUTURE) IMPLANT
SUT VIC AB 4-0 RB1 27 (SUTURE)
SUT VIC AB 4-0 RB1 27X BRD (SUTURE) IMPLANT
SUT VICRYL 3-0 CR8 SH (SUTURE) IMPLANT
SUT VICRYL 4-0 PS2 18IN ABS (SUTURE) IMPLANT
SUT VICRYL AB 3 0 TIES (SUTURE) IMPLANT
SYR CONTROL 10ML LL (SYRINGE) ×4 IMPLANT
TAPE CLOTH 3X10 TAN LF (GAUZE/BANDAGES/DRESSINGS) ×4 IMPLANT
TOWEL GREEN STERILE FF (TOWEL DISPOSABLE) ×8 IMPLANT
TUBE CONNECTING 20'X1/4 (TUBING) ×1
TUBE CONNECTING 20X1/4 (TUBING) ×3 IMPLANT
YANKAUER SUCT BULB TIP NO VENT (SUCTIONS) ×4 IMPLANT

## 2020-02-17 NOTE — Transfer of Care (Signed)
Immediate Anesthesia Transfer of Care Note  Patient: Delia Chimes  Procedure(s) Performed: INCISION AND DRAINAGE PILONIDAL ABSCESS (N/A Back) EXCISION CYST RIGHT NECK (Right Neck)  Patient Location: PACU  Anesthesia Type:General  Level of Consciousness: awake and alert   Airway & Oxygen Therapy: Patient Spontanous Breathing and Patient connected to face mask oxygen  Post-op Assessment: Report given to RN, Post -op Vital signs reviewed and stable and Patient moving all extremities X 4  Post vital signs: Reviewed and stable  Last Vitals:  Vitals Value Taken Time  BP    Temp    Pulse    Resp 15 02/17/20 0932  SpO2    Vitals shown include unvalidated device data.  Last Pain:  Vitals:   02/17/20 0745  TempSrc: Oral  PainSc: 3       Patients Stated Pain Goal: 3 (64/15/83 0940)  Complications: No complications documented.

## 2020-02-17 NOTE — H&P (Signed)
Michelle Tanner  Location: [facility] Patient #: [id number] DOB: 12/27/1970 Single / Language: Michelle Tanner / Race: Black or African American Female   History of Present Illness  The patient is a 49 year old female who presents with a pilonidal cyst. We are asked to see the patient in consultation by Dr. Charlesetta Garibaldi to evaluate her for a pilonidal abscess. The patient is a 49 year old black female who comes in with several complaints. First she has a small spot on the right mid neck that is bothering her. This lump sometimes gets larger and smaller. She denies any drainage. She denies any fevers or chills. She does not smoke. She does have diabetes. Second she also has some tenderness in her gluteal cleft area. She denies any fevers or chills or drainage from the area. The area has been hurting her for 6-8 months. The third she also has some tenderness underneath her pannus along her old C-section Scar. She says that she feels lumps at times associated with this. She has some significant sensitivity of the skin. Again she denies drainage or fevers or chills.   Past Surgical History  Cesarean Section - 1  Foot Surgery  Bilateral.  Diagnostic Studies History  Colonoscopy  within last year Mammogram  within last year Pap Smear  1-5 years ago  Allergies  No Known Drug Allergies    Medication History Tresiba FlexTouch (200UNIT/ML Soln Pen-inj, Subcutaneous) Active. metFORMIN HCl (1000MG  Tablet, Oral) Active. Folic Acid (1MG  Tablet, Oral) Active. traMADol HCl (50MG  Tablet, Oral) Active. Farxiga (10MG  Tablet, Oral) Active. Ibuprofen (800MG  Tablet, Oral) Active. valACYclovir HCl (500MG  Tablet, Oral) Active. Fusion Plus (Oral) Active. Multi Vitamin (Oral) Active. Ozempic (1 MG/DOSE) (2MG /1.5ML Soln Pen-inj, Subcutaneous) Active. Medications Reconciled  Social History Caffeine use  Carbonated beverages, Coffee, Tea. No drug use  Tobacco use   Never smoker.  Family History Diabetes Mellitus  Father. Heart Disease  Father. Hypertension  Father. Respiratory Condition  Mother.  Pregnancy / Birth History  Age at menarche  54 years. Contraceptive History  Depo-provera, Oral contraceptives. Maternal age  39-25 Para  1 Regular periods   Other Problems  Diabetes Mellitus     Review of Systems  General Not Present- Appetite Loss, Chills, Fatigue, Fever, Night Sweats, Weight Gain and Weight Loss. Note: All other systems negative (unless as noted in HPI & included Review of Systems) Skin Not Present- Change in Wart/Mole, Dryness, Hives, Jaundice, New Lesions, Non-Healing Wounds, Rash and Ulcer. HEENT Not Present- Earache, Hearing Loss, Hoarseness, Nose Bleed, Oral Ulcers, Ringing in the Ears, Seasonal Allergies, Sinus Pain, Sore Throat, Visual Disturbances, Wears glasses/contact lenses and Yellow Eyes. Respiratory Not Present- Bloody sputum, Chronic Cough, Difficulty Breathing, Snoring and Wheezing. Breast Not Present- Breast Mass, Breast Pain, Nipple Discharge and Skin Changes. Cardiovascular Not Present- Chest Pain, Difficulty Breathing Lying Down, Leg Cramps, Palpitations, Rapid Heart Rate, Shortness of Breath and Swelling of Extremities. Gastrointestinal Not Present- Abdominal Pain, Bloating, Bloody Stool, Change in Bowel Habits, Chronic diarrhea, Constipation, Difficulty Swallowing, Excessive gas, Gets full quickly at meals, Hemorrhoids, Indigestion, Nausea, Rectal Pain and Vomiting. Female Genitourinary Not Present- Frequency, Nocturia, Painful Urination, Pelvic Pain and Urgency. Musculoskeletal Not Present- Back Pain, Joint Pain, Joint Stiffness, Muscle Pain, Muscle Weakness and Swelling of Extremities. Neurological Not Present- Decreased Memory, Fainting, Headaches, Numbness, Seizures, Tingling, Tremor, Trouble walking and Weakness. Psychiatric Not Present- Anxiety, Bipolar, Change in Sleep Pattern, Depression,  Fearful and Frequent crying. Endocrine Not Present- Cold Intolerance, Excessive Hunger, Hair Changes, Heat Intolerance, Hot  flashes and New Diabetes. Hematology Not Present- Easy Bruising, Excessive bleeding, Gland problems, HIV and Persistent Infections.  Vitals  Weight: 189 lb Height: 61in Body Surface Area: 1.84 m Body Mass Index: 35.71 kg/m  Temp.: 97.17F (Temporal)  Pulse: 101 (Regular)  P.OX: 96% (Room air) BP: 124/86(Sitting, Left Arm, Standard)       Physical Exam General Mental Status-Alert. General Appearance-Consistent with stated age. Hydration-Well hydrated. Voice-Normal.  Head and Neck Head-normocephalic, atraumatic with no lesions or palpable masses. Trachea-midline. Thyroid Gland Characteristics - normal size and consistency. Note: There is a small 1 cm firm round mass in the skin of the right mid neck. There is no cellulitis associated with it. It seems to be isolated to the skin   Eye Eyeball - Bilateral-Extraocular movements intact. Sclera/Conjunctiva - Bilateral-No scleral icterus.  Chest and Lung Exam Chest and lung exam reveals -quiet, even and easy respiratory effort with no use of accessory muscles and on auscultation, normal breath sounds, no adventitious sounds and normal vocal resonance. Inspection Chest Wall - Normal. Back - normal.  Cardiovascular Cardiovascular examination reveals -normal heart sounds, regular rate and rhythm with no murmurs and normal pedal pulses bilaterally.  Abdomen Note: The abdomen is soft with minimal tenderness. There is a little bit of redness of the skin associated with her underside of her pannus but no obvious abscess or breaks in the skin with drainage. There are no areas of fluctuance. There is no palpable mass.   Rectal Note: There is one small pit in the skin high in the gluteal cleft on the left side. There is no cellulitis or abscess today.   Neurologic Neurologic  evaluation reveals -alert and oriented x 3 with no impairment of recent or remote memory. Mental Status-Normal.  Musculoskeletal Normal Exam - Left-Upper Extremity Strength Normal and Lower Extremity Strength Normal. Normal Exam - Right-Upper Extremity Strength Normal and Lower Extremity Strength Normal.  Lymphatic Head & Neck  General Head & Neck Lymphatics: Bilateral - Description - Normal. Axillary  General Axillary Region: Bilateral - Description - Normal. Tenderness - Non Tender. Femoral & Inguinal  Generalized Femoral & Inguinal Lymphatics: Bilateral - Description - Normal. Tenderness - Non Tender.    Assessment & Plan EPIDERMAL CYST OF NECK (L72.0) PILONIDAL ABSCESS (L05.01) Impression: The patient appears to have 3 separate issues. First she has a small cyst involving the skin of her right mid neck that bothers her. She would like to have this removed. Because of the superficial nature of it I think it would be fairly straightforward to excise. I have discussed with her in detail the risks and benefits of the operation as well as some of the technical aspects and she understands and wishes to proceed. Second she does appear to have a pilonidal cyst that is not actively infected today but has been in the past. Because of the cyclical nature of these cysts and abscesses I think she would benefit from having this incised and drained. If she is a candidate for acel then I think that would significantly speed the healing process. Otherwise she would have to do dressing changes to the area until it heals. I have discussed with her in detail the risks and benefits of this operation as well as some of the technical aspects and she understands and wishes to proceed. Thirdly she has some irritation and discomfort of her anterior abdominal wall underneath her pannus. It is difficult to tell whether this is a panniculitis or if she has an underlying  hernia causing her discomfort. I would  like to evaluate this with a CT scan of the abdomen and pelvis. We will call her with the results of the study. Otherwise we will proceed with planning for surgery on the right neck and gluteal cleft area. This patient encounter took 60 minutes today to perform the following: take history, perform exam, review outside records, interpret imaging, counsel the patient on their diagnosis and document encounter, findings & plan in the EHR

## 2020-02-17 NOTE — Discharge Instructions (Signed)
  Post Anesthesia Home Care Instructions  Activity: Get plenty of rest for the remainder of the day. A responsible individual must stay with you for 24 hours following the procedure.  For the next 24 hours, DO NOT: -Drive a car -Paediatric nurse -Drink alcoholic beverages -Take any medication unless instructed by your physician -Make any legal decisions or sign important papers.  Meals: Start with liquid foods such as gelatin or soup. Progress to regular foods as tolerated. Avoid greasy, spicy, heavy foods. If nausea and/or vomiting occur, drink only clear liquids until the nausea and/or vomiting subsides. Call your physician if vomiting continues.  Special Instructions/Symptoms: Your throat may feel dry or sore from the anesthesia or the breathing tube placed in your throat during surgery. If this causes discomfort, gargle with warm salt water. The discomfort should disappear within 24 hours.  If you had a scopolamine patch placed behind your ear for the management of post- operative nausea and/or vomiting:  1. The medication in the patch is effective for 72 hours, after which it should be removed.  Wrap patch in a tissue and discard in the trash. Wash hands thoroughly with soap and water. 2. You may remove the patch earlier than 72 hours if you experience unpleasant side effects which may include dry mouth, dizziness or visual disturbances. 3. Avoid touching the patch. Wash your hands with soap and water after contact with the patch.    May take Tylenol after 2pm, if needed.

## 2020-02-17 NOTE — Op Note (Addendum)
02/17/2020  9:20 AM  PATIENT:  Michelle Tanner  49 y.o. female  PRE-OPERATIVE DIAGNOSIS:  CYST RIGHT NECK AND PILONIDAL ABSCESS  POST-OPERATIVE DIAGNOSIS:  CYST RIGHT NECK AND PILONIDAL ABSCESS  PROCEDURE:  Procedure(s): INCISION AND DRAINAGE PILONIDAL ABSCESS (N/A) EXCISION CYST RIGHT NECK (Right)  SURGEON:  Surgeon(s) and Role:    * Jovita Kussmaul, MD - Primary  PHYSICIAN ASSISTANT:   ASSISTANTS: none   ANESTHESIA:   local and general  EBL:  Minimal   BLOOD ADMINISTERED:none  DRAINS: none   LOCAL MEDICATIONS USED:  MARCAINE     SPECIMEN:  Source of Specimen:  cyst right neck and possible lipoma of gluteal cleft  DISPOSITION OF SPECIMEN:  PATHOLOGY  COUNTS:  YES  TOURNIQUET:  * No tourniquets in log *  DICTATION: .Dragon Dictation   After informed consent was obtained the patient was brought to the operating room and placed in the supine position on the operating table.  After adequate induction of general anesthesia the patient's right neck was prepped with ChloraPrep, allowed to dry, and draped in usual sterile manner.  An appropriate timeout was performed.  The patient had a cyst on the right neck.  The area around this was infiltrated with quarter percent Marcaine.  An elliptical incision was made around the cyst with a 15 blade knife.  The incision was carried through the skin and subcutaneous tissue sharply with a 15 blade knife until the entire cyst was removed.  It was sent to pathology for further evaluation.  Hemostasis was achieved using the Bovie electrocautery.  The incision was then closed with interrupted 4-0 Monocryl subcuticular stitches.  Dermabond dressings were applied.  The patient tolerated the procedure well.  At the end of this portion all needle sponge and instrument counts were correct.  The drapes were removed.  The patient was then moved into a prone position on the operating room table and all pressure points were padded.  The buttocks were  retracted laterally with tape.  The gluteal cleft area was prepped with ChloraPrep, allowed to dry, and draped in usual sterile manner.  Another timeout was performed.  A probe was used to probe the opening in the skin high on the left side of the gluteal cleft.  There was only a small cavity associated with this.  There did not appear to be a lot of granulation tissue or hair debris.  We did find what appeared to be a lipoma in this area.  The lipoma was excised by combination of sharp Bovie dissection and blunt hemostat dissection.  It appeared as though we were able to remove all of the lipoma.  Hemostasis was achieved using the Bovie electrocautery.  The fatty tissue was sent to pathology for further evaluation.  The wound was infiltrated with quarter percent Marcaine and packed with 4 x 4 gauze.  Sterile dressings were applied.  The patient tolerated the procedure well.  At the end of the case all needle sponge and instrument counts were correct.  The patient was then awakened and taken to recovery in stable condition. The cyst measured 1cm and the lipoma measured approximately 3cm  PLAN OF CARE: Discharge to home after PACU  PATIENT DISPOSITION:  PACU - hemodynamically stable.   Delay start of Pharmacological VTE agent (>24hrs) due to surgical blood loss or risk of bleeding: not applicable

## 2020-02-17 NOTE — Anesthesia Postprocedure Evaluation (Signed)
Anesthesia Post Note  Patient: Michelle Tanner  Procedure(s) Performed: INCISION AND DRAINAGE PILONIDAL ABSCESS (N/A Back) EXCISION CYST RIGHT NECK (Right Neck)     Patient location during evaluation: PACU Anesthesia Type: General Level of consciousness: awake and alert Pain management: pain level controlled Vital Signs Assessment: post-procedure vital signs reviewed and stable Respiratory status: spontaneous breathing, nonlabored ventilation, respiratory function stable and patient connected to nasal cannula oxygen Cardiovascular status: blood pressure returned to baseline and stable Postop Assessment: no apparent nausea or vomiting Anesthetic complications: no   No complications documented.  Last Vitals:  Vitals:   02/17/20 1015 02/17/20 1034  BP: (!) 143/85 (!) 151/88  Pulse: 87 88  Resp: 17 20  Temp:  36.9 C  SpO2: 96% 98%    Last Pain:  Vitals:   02/17/20 1034  TempSrc:   PainSc: 0-No pain                 Andres Escandon

## 2020-02-17 NOTE — Interval H&P Note (Signed)
History and Physical Interval Note:  02/17/2020 8:10 AM  Michelle Tanner  has presented today for surgery, with the diagnosis of CYST RIGHT NECK AND PILONIDAL ABSCESS.  The various methods of treatment have been discussed with the patient and family. After consideration of risks, benefits and other options for treatment, the patient has consented to  Procedure(s): INCISION AND DRAINAGE PILONIDAL ABSCESS (N/A) EXCISION CYST RIGHT NECK (Right) as a surgical intervention.  The patient's history has been reviewed, patient examined, no change in status, stable for surgery.  I have reviewed the patient's chart and labs.  Questions were answered to the patient's satisfaction.     [name]

## 2020-02-17 NOTE — Anesthesia Procedure Notes (Signed)
Procedure Name: Intubation Date/Time: 02/17/2020 8:30 AM Performed by: Niel Hummer, CRNA Pre-anesthesia Checklist: Patient identified, Emergency Drugs available, Suction available and Patient being monitored Patient Re-evaluated:Patient Re-evaluated prior to induction Oxygen Delivery Method: Circle system utilized Preoxygenation: Pre-oxygenation with 100% oxygen Induction Type: IV induction Ventilation: Mask ventilation without difficulty Laryngoscope Size: Mac and 3 Grade View: Grade I Tube type: Oral Tube size: 7.0 mm Number of attempts: 1 Airway Equipment and Method: Stylet Placement Confirmation: ETT inserted through vocal cords under direct vision,  positive ETCO2 and breath sounds checked- equal and bilateral Secured at: 22 cm Tube secured with: Tape Dental Injury: Teeth and Oropharynx as per pre-operative assessment

## 2020-02-18 ENCOUNTER — Encounter (HOSPITAL_BASED_OUTPATIENT_CLINIC_OR_DEPARTMENT_OTHER): Payer: Self-pay | Admitting: General Surgery

## 2020-02-18 LAB — SURGICAL PATHOLOGY

## 2020-02-22 ENCOUNTER — Ambulatory Visit: Payer: 59 | Admitting: Family

## 2020-02-22 ENCOUNTER — Other Ambulatory Visit: Payer: 59

## 2020-02-26 ENCOUNTER — Other Ambulatory Visit (HOSPITAL_COMMUNITY): Payer: Self-pay | Admitting: Surgery

## 2020-02-26 MED FILL — PREGABALIN 150 MG CAPS: 150 | 30 days supply | Qty: 60 | Fill #1

## 2020-02-26 MED FILL — oxyCODONE HCL 5 MG TABS: 5 | 3 days supply | Qty: 10 | Fill #0

## 2020-03-07 DIAGNOSIS — M7541 Impingement syndrome of right shoulder: Secondary | ICD-10-CM | POA: Diagnosis not present

## 2020-03-07 DIAGNOSIS — M13811 Other specified arthritis, right shoulder: Secondary | ICD-10-CM | POA: Diagnosis not present

## 2020-03-07 DIAGNOSIS — M75111 Incomplete rotator cuff tear or rupture of right shoulder, not specified as traumatic: Secondary | ICD-10-CM | POA: Diagnosis not present

## 2020-03-09 DIAGNOSIS — Z20822 Contact with and (suspected) exposure to covid-19: Secondary | ICD-10-CM | POA: Diagnosis not present

## 2020-03-17 ENCOUNTER — Encounter: Payer: Self-pay | Admitting: Internal Medicine

## 2020-03-17 ENCOUNTER — Other Ambulatory Visit (HOSPITAL_COMMUNITY): Payer: Self-pay | Admitting: Internal Medicine

## 2020-03-17 ENCOUNTER — Other Ambulatory Visit: Payer: Self-pay

## 2020-03-17 DIAGNOSIS — D560 Alpha thalassemia: Secondary | ICD-10-CM

## 2020-03-17 MED ORDER — FOLIC ACID 1 MG PO TABS: 1.0000 mg | ORAL_TABLET | Freq: Every day | ORAL | 1 refills | Status: AC

## 2020-03-17 MED ORDER — FUSION PLUS PO CAPS: 1.0000 | ORAL_CAPSULE | Freq: Every day | ORAL | 1 refills | Status: DC

## 2020-03-17 MED ORDER — DAPAGLIFLOZIN PROPANEDIOL 10 MG PO TABS
10.0000 mg | ORAL_TABLET | Freq: Every day | ORAL | 2 refills | Status: DC
Start: 2020-03-17 — End: 2020-09-15

## 2020-03-17 MED FILL — FARXIGA 10 MG TABLET: 10 | 90 days supply | Qty: 90 | Fill #0

## 2020-03-17 MED FILL — FUSION PLUS CAPSULE: 90 days supply | Qty: 90 | Fill #0

## 2020-03-17 MED FILL — FOLIC ACID 1 MG TABS: 1 | 90 days supply | Qty: 90 | Fill #0

## 2020-03-24 ENCOUNTER — Encounter: Payer: Self-pay | Admitting: Internal Medicine

## 2020-03-29 ENCOUNTER — Other Ambulatory Visit (HOSPITAL_COMMUNITY): Payer: Self-pay | Admitting: Internal Medicine

## 2020-03-29 ENCOUNTER — Encounter: Payer: Self-pay | Admitting: Internal Medicine

## 2020-03-29 ENCOUNTER — Other Ambulatory Visit: Payer: Self-pay | Admitting: Internal Medicine

## 2020-03-29 MED FILL — PREGABALIN 150 MG CAPS: 150 | 30 days supply | Qty: 60 | Fill #2

## 2020-03-29 MED FILL — METFORMIN HCL 1000 MG TABS: 1000 | 90 days supply | Qty: 180 | Fill #0

## 2020-04-19 ENCOUNTER — Encounter: Payer: Self-pay | Admitting: Internal Medicine

## 2020-04-19 ENCOUNTER — Other Ambulatory Visit: Payer: Self-pay

## 2020-04-19 ENCOUNTER — Ambulatory Visit: Payer: 59 | Admitting: Internal Medicine

## 2020-04-19 ENCOUNTER — Other Ambulatory Visit (HOSPITAL_COMMUNITY): Payer: Self-pay | Admitting: Internal Medicine

## 2020-04-19 VITALS — BP 134/78 | HR 97 | Temp 98.5°F | Ht 60.8 in | Wt 188.0 lb

## 2020-04-19 DIAGNOSIS — E66812 Morbid (severe) obesity due to excess calories: Secondary | ICD-10-CM

## 2020-04-19 DIAGNOSIS — E1141 Type 2 diabetes mellitus with diabetic mononeuropathy: Secondary | ICD-10-CM | POA: Diagnosis not present

## 2020-04-19 DIAGNOSIS — Z0000 Encounter for general adult medical examination without abnormal findings: Secondary | ICD-10-CM

## 2020-04-19 DIAGNOSIS — Z6835 Body mass index (BMI) 35.0-35.9, adult: Secondary | ICD-10-CM | POA: Diagnosis not present

## 2020-04-19 DIAGNOSIS — M7581 Other shoulder lesions, right shoulder: Secondary | ICD-10-CM

## 2020-04-19 LAB — POCT UA - MICROALBUMIN
Albumin/Creatinine Ratio, Urine, POC: 30
Creatinine, POC: 300 mg/dL
Microalbumin Ur, POC: 30 mg/L

## 2020-04-19 LAB — POCT URINALYSIS DIPSTICK
Bilirubin, UA: NEGATIVE
Blood, UA: NEGATIVE
Glucose, UA: POSITIVE — AB
Ketones, UA: NEGATIVE
Leukocytes, UA: NEGATIVE
Nitrite, UA: NEGATIVE
Protein, UA: NEGATIVE
Spec Grav, UA: 1.025 (ref 1.010–1.025)
Urobilinogen, UA: 0.2 E.U./dL
pH, UA: 7 (ref 5.0–8.0)

## 2020-04-19 MED ORDER — TRAMADOL HCL 50 MG PO TABS
50.0000 mg | ORAL_TABLET | Freq: Four times a day (QID) | ORAL | 0 refills | Status: AC | PRN
Start: 2020-04-19 — End: 2021-04-19

## 2020-04-19 MED ORDER — PREGABALIN 150 MG PO CAPS
ORAL_CAPSULE | ORAL | 2 refills | Status: DC
Start: 2020-04-19 — End: 2021-05-24

## 2020-04-19 MED ORDER — FREESTYLE LIBRE 2 SENSOR MISC
1 refills | Status: DC
Start: 2020-04-19 — End: 2020-09-15

## 2020-04-19 MED FILL — FREESTYLE LIBRE 2 SENSOR MI: 28 days supply | Qty: 2 | Fill #0

## 2020-04-19 MED FILL — traMADol HCL 50 MG TABS: 50 | 8 days supply | Qty: 30 | Fill #0

## 2020-04-19 NOTE — Patient Instructions (Signed)
Health Maintenance, Female Adopting a healthy lifestyle and getting preventive care are important in promoting health and wellness. Ask your health care provider about:  The right schedule for you to have regular tests and exams.  Things you can do on your own to prevent diseases and keep yourself healthy. What should I know about diet, weight, and exercise? Eat a healthy diet   Eat a diet that includes plenty of vegetables, fruits, low-fat dairy products, and lean protein.  Do not eat a lot of foods that are high in solid fats, added sugars, or sodium. Maintain a healthy weight Body mass index (BMI) is used to identify weight problems. It estimates body fat based on height and weight. Your health care provider can help determine your BMI and help you achieve or maintain a healthy weight. Get regular exercise Get regular exercise. This is one of the most important things you can do for your health. Most adults should:  Exercise for at least 150 minutes each week. The exercise should increase your heart rate and make you sweat (moderate-intensity exercise).  Do strengthening exercises at least twice a week. This is in addition to the moderate-intensity exercise.  Spend less time sitting. Even light physical activity can be beneficial. Watch cholesterol and blood lipids Have your blood tested for lipids and cholesterol at 49 years of age, then have this test every 5 years. Have your cholesterol levels checked more often if:  Your lipid or cholesterol levels are high.  You are older than 49 years of age.  You are at high risk for heart disease. What should I know about cancer screening? Depending on your health history and family history, you may need to have cancer screening at various ages. This may include screening for:  Breast cancer.  Cervical cancer.  Colorectal cancer.  Skin cancer.  Lung cancer. What should I know about heart disease, diabetes, and high blood  pressure? Blood pressure and heart disease  High blood pressure causes heart disease and increases the risk of stroke. This is more likely to develop in people who have high blood pressure readings, are of African descent, or are overweight.  Have your blood pressure checked: ? Every 3-5 years if you are 49-39 years of age. ? Every year if you are 40 years old or older. Diabetes Have regular diabetes screenings. This checks your fasting blood sugar level. Have the screening done:  Once every three years after age 40 if you are at a normal weight and have a low risk for diabetes.  More often and at a younger age if you are overweight or have a high risk for diabetes. What should I know about preventing infection? Hepatitis B If you have a higher risk for hepatitis B, you should be screened for this virus. Talk with your health care provider to find out if you are at risk for hepatitis B infection. Hepatitis C Testing is recommended for:  Everyone born from 1945 through 1965.  Anyone with known risk factors for hepatitis C. Sexually transmitted infections (STIs)  Get screened for STIs, including gonorrhea and chlamydia, if: ? You are sexually active and are younger than 49 years of age. ? You are older than 49 years of age and your health care provider tells you that you are at risk for this type of infection. ? Your sexual activity has changed since you were last screened, and you are at increased risk for chlamydia or gonorrhea. Ask your health care provider if   you are at risk.  Ask your health care provider about whether you are at high risk for HIV. Your health care provider may recommend a prescription medicine to help prevent HIV infection. If you choose to take medicine to prevent HIV, you should first get tested for HIV. You should then be tested every 3 months for as long as you are taking the medicine. Pregnancy  If you are about to stop having your period (premenopausal) and  you may become pregnant, seek counseling before you get pregnant.  Take 400 to 800 micrograms (mcg) of folic acid every day if you become pregnant.  Ask for birth control (contraception) if you want to prevent pregnancy. Osteoporosis and menopause Osteoporosis is a disease in which the bones lose minerals and strength with aging. This can result in bone fractures. If you are 65 years old or older, or if you are at risk for osteoporosis and fractures, ask your health care provider if you should:  Be screened for bone loss.  Take a calcium or vitamin D supplement to lower your risk of fractures.  Be given hormone replacement therapy (HRT) to treat symptoms of menopause. Follow these instructions at home: Lifestyle  Do not use any products that contain nicotine or tobacco, such as cigarettes, e-cigarettes, and chewing tobacco. If you need help quitting, ask your health care provider.  Do not use street drugs.  Do not share needles.  Ask your health care provider for help if you need support or information about quitting drugs. Alcohol use  Do not drink alcohol if: ? Your health care provider tells you not to drink. ? You are pregnant, may be pregnant, or are planning to become pregnant.  If you drink alcohol: ? Limit how much you use to 0-1 drink a day. ? Limit intake if you are breastfeeding.  Be aware of how much alcohol is in your drink. In the [place] one drink equals one 12 oz bottle of beer (355 mL), one 5 oz glass of wine (148 mL), or one 1 oz glass of hard liquor (44 mL). General instructions  Schedule regular health, dental, and eye exams.  Stay current with your vaccines.  Tell your health care provider if: ? You often feel depressed. ? You have ever been abused or do not feel safe at home. Summary  Adopting a healthy lifestyle and getting preventive care are important in promoting health and wellness.  Follow your health care provider's instructions about healthy  diet, exercising, and getting tested or screened for diseases.  Follow your health care provider's instructions on monitoring your cholesterol and blood pressure. This information is not intended to replace advice given to you by your health care provider. Make sure you discuss any questions you have with your health care provider. Document Revised: 04/30/2018 Document Reviewed: 04/30/2018 Elsevier Patient Education  2020 [employer] Inc.  

## 2020-04-19 NOTE — Progress Notes (Signed)
I,Michelle Tanner,acting as a Education administrator for Michelle Greenland, MD.,have documented all relevant documentation on the behalf of Michelle Greenland, MD,as directed by  Michelle Greenland, MD while in the presence of Michelle Greenland, MD.  This visit occurred during the SARS-CoV-2 public health emergency.  Safety protocols were in place, including screening questions prior to the visit, additional usage of staff PPE, and extensive cleaning of exam room while observing appropriate contact time as indicated for disinfecting solutions.  Subjective:     Patient ID: Michelle Tanner , female    DOB: 01-Jan-1971 , 49 y.o.   MRN: [id number]   Chief Complaint  Patient presents with  . Annual Exam  . Diabetes    HPI  She is here today for a full physical examination. She is followed by Gyn, Dr. Charlesetta Tanner for her GYN exams. She has no specific concerns or complaints at this time.   Diabetes She presents for her follow-up diabetic visit. She has type 2 diabetes mellitus. There are no hypoglycemic associated symptoms. Associated symptoms include foot paresthesias. There are no hypoglycemic complications. Risk factors for coronary artery disease include diabetes mellitus, sedentary lifestyle, stress and obesity. Current diabetic treatment includes insulin injections. She is compliant with treatment most of the time. She is following a generally healthy diet. She never participates in exercise. Her breakfast blood glucose is taken between 8-9 am. Her breakfast blood glucose range is generally 110-130 mg/dl. An ACE inhibitor/angiotensin II receptor blocker is not being taken. She does not see a podiatrist.Eye exam is not current.     Past Medical History:  Diagnosis Date  . Back pain   . Diabetes mellitus   . Edema, lower extremity   . Fatty liver    per patient   . Fibromyalgia   . Frozen shoulder    left and right   . Hx of migraine headaches   . Joint pain   . Migraines   . Neuromuscular disorder ([place])     diabetic neuropathy  . Shoulder pain      Family History  Problem Relation Age of Onset  . Rectal cancer Paternal Grandmother   . Diabetes Father   . Kidney disease Father   . CAD Father   . Hyperlipidemia Father   . Hypertension Father   . Heart disease Father   . Sudden death Father   . Cancer Mother   . Cancer Paternal Uncle   . Cancer Paternal Aunt      Current Outpatient Medications:  .  acetaminophen (TYLENOL) 650 MG CR tablet, Take 650 mg by mouth every 8 (eight) hours as needed for pain., Disp: , Rfl:  .  APPLE CIDER VINEGAR PO, Take by mouth., Disp: , Rfl:  .  Continuous Blood Gluc Receiver (FREESTYLE LIBRE 14 DAY READER) DEVI, USE AS DIRECTED TO CHECK BLOOD SUGARS FOUR TIMES A DAY, Disp: 3 each, Rfl: 0 .  Continuous Blood Gluc Sensor (FREESTYLE LIBRE 2 SENSOR) MISC, USE AS DIRECTED TO CHECK BLOOD SUGARS FOUR TIMES A DAY, Disp: 3 each, Rfl: 1 .  dapagliflozin propanediol (FARXIGA) 10 MG TABS tablet, Take 1 tablet (10 mg total) by mouth daily., Disp: 90 tablet, Rfl: 2 .  diclofenac Sodium (VOLTAREN) 1 % GEL, Apply topically as needed., Disp: , Rfl:  .  folic acid (FOLVITE) 1 MG tablet, Take 1 tablet (1 mg total) by mouth daily., Disp: 90 tablet, Rfl: 1 .  ibuprofen (ADVIL,MOTRIN) 800 MG tablet, Take 800 mg by mouth every  8 (eight) hours as needed. For pain, Disp: , Rfl:  .  insulin degludec (TRESIBA FLEXTOUCH) 200 UNIT/ML FlexTouch Pen, Inject 28 Units into the skin at bedtime., Disp: 3 mL, Rfl: 3 .  Iron-FA-B Cmp-C-Biot-Probiotic (FUSION PLUS) CAPS, Take 1 capsule by mouth daily. 1 capsule daily, Disp: 90 capsule, Rfl: 1 .  linaclotide (LINZESS) 145 MCG CAPS capsule, Take 145 mcg by mouth daily before breakfast., Disp: , Rfl:  .  meloxicam (MOBIC) 7.5 MG tablet, One tab po qd prn, Disp: 30 tablet, Rfl: 0 .  metFORMIN (GLUCOPHAGE) 1000 MG tablet, TAKE 1 TABLET BY MOUTH TWO TIMES A DAY WITH MORNING AND EVENING MEALS, Disp: 180 tablet, Rfl: 0 .  Multiple Vitamin (MULITIVITAMIN  WITH MINERALS) TABS, Take 1 tablet by mouth daily., Disp: , Rfl:  .  pregabalin (LYRICA) 150 MG capsule, One capsules po tid, Disp: 270 capsule, Rfl: 2 .  Semaglutide, 1 MG/DOSE, (OZEMPIC, 1 MG/DOSE,) 2 MG/1.5ML SOPN, Inject 1 mg into the skin once a week., Disp: 9 pen, Rfl: 1 .  SUMAtriptan (IMITREX) 50 MG tablet, Take 1 tablet (50 mg total) by mouth every 2 (two) hours as needed., Disp: 10 tablet, Rfl: 2 .  UNIFINE PENTIPS PLUS 31G X 5 MM MISC, Use as directed with Ozempic pen, Disp: 90 each, Rfl: 2 .  valACYclovir (VALTREX) 500 MG tablet, Take 500 mg by mouth 3 (three) times daily., Disp: , Rfl: 0 .  traMADol (ULTRAM) 50 MG tablet, Take 1 tablet (50 mg total) by mouth every 6 (six) hours as needed., Disp: 30 tablet, Rfl: 0   Allergies  Allergen Reactions  . Latex Hives  . Peanut-Containing Drug Products   . Shrimp [Shellfish Allergy]       The patient states she uses none for birth control. Last LMP was Patient's last menstrual period was 04/02/2020.. Negative for Dysmenorrhea. Negative for: breast discharge, breast lump(s), breast pain and breast self exam. Associated symptoms include abnormal vaginal bleeding. Pertinent negatives include abnormal bleeding (hematology), anxiety, decreased libido, depression, difficulty falling sleep, dyspareunia, history of infertility, nocturia, sexual dysfunction, sleep disturbances, urinary incontinence, urinary urgency, vaginal discharge and vaginal itching. Diet regular.The patient states her exercise level is  intermittent.  . The patient's tobacco use is:  Social History   Tobacco Use  Smoking Status Never Smoker  Smokeless Tobacco Never Used  . She has been exposed to passive smoke. The patient's alcohol use is:  Social History   Substance and Sexual Activity  Alcohol Use Yes   Comment: occasionally  about twice a year    Review of Systems  Constitutional: Negative.   HENT: Negative.   Eyes: Negative.   Respiratory: Negative.    Cardiovascular: Negative.   Gastrointestinal: Negative.   Endocrine: Negative.   Genitourinary: Negative.   Musculoskeletal: Positive for arthralgias.       She c/o r shoulder pain. Has been eval by Ortho. Needs to have surgery.   Skin: Negative.   Allergic/Immunologic: Negative.   Neurological: Negative.   Hematological: Negative.   Psychiatric/Behavioral: Negative.      Today's Vitals   04/19/20 1429  BP: 134/78  Pulse: 97  Temp: 98.5 F (36.9 C)  TempSrc: Oral  Weight: 188 lb (85.3 kg)  Height: 5' 0.8" (1.544 m)   Body mass index is 35.76 kg/m.  Wt Readings from Last 3 Encounters:  04/19/20 188 lb (85.3 kg)  02/17/20 189 lb 2.5 oz (85.8 kg)  01/14/20 187 lb 12.8 oz (85.2 kg)  Objective:  Physical Exam Vitals and nursing note reviewed.  Constitutional:      Appearance: Normal appearance.  HENT:     Head: Normocephalic and atraumatic.     Right Ear: Tympanic membrane, ear canal and external ear normal.     Left Ear: Tympanic membrane, ear canal and external ear normal.     Nose: Nose normal.     Mouth/Throat:     Mouth: Mucous membranes are moist.     Pharynx: Oropharynx is clear.  Eyes:     Extraocular Movements: Extraocular movements intact.     Conjunctiva/sclera: Conjunctivae normal.     Pupils: Pupils are equal, round, and reactive to light.  Cardiovascular:     Rate and Rhythm: Normal rate and regular rhythm.     Pulses: Normal pulses.          Dorsalis pedis pulses are 2+ on the right side and 2+ on the left side.     Heart sounds: Normal heart sounds.  Pulmonary:     Effort: Pulmonary effort is normal.     Breath sounds: Normal breath sounds.  Chest:     Breasts: Tanner Score is 5.        Right: Normal.        Left: Normal.  Abdominal:     General: Bowel sounds are normal.     Palpations: Abdomen is soft.     Comments: Rounded, soft, difficult to assess organomegaly.   Genitourinary:    Comments: deferred Musculoskeletal:     Right  shoulder: Tenderness and bony tenderness present. Decreased range of motion.     Cervical back: Normal range of motion and neck supple.  Feet:     Right foot:     Protective Sensation: 5 sites tested. 5 sites sensed.     Skin integrity: Skin integrity normal.     Toenail Condition: Right toenails are normal.     Left foot:     Protective Sensation: 5 sites tested. 5 sites sensed.     Skin integrity: Skin integrity normal.     Toenail Condition: Left toenails are normal.  Skin:    General: Skin is warm and dry.  Neurological:     General: No focal deficit present.     Mental Status: She is alert and oriented to person, place, and time.  Psychiatric:        Mood and Affect: Mood normal.        Behavior: Behavior normal.         Assessment And Plan:     1. Routine general medical examination at health care facility Comments: A full exam was performed. Importance of monthly self breast exams was discussed with the patient. PATIENT IS ADVISED TO GET 30-45 MINUTES REGULAR EXERCISE NO LESS THAN FOUR TO FIVE DAYS PER WEEK - BOTH WEIGHTBEARING EXERCISES AND AEROBIC ARE RECOMMENDED.  PATIENT IS ADVISED TO FOLLOW A HEALTHY DIET WITH AT LEAST SIX FRUITS/VEGGIES PER DAY, DECREASE INTAKE OF RED MEAT, AND TO INCREASE FISH INTAKE TO TWO DAYS PER WEEK.  MEATS/FISH SHOULD NOT BE FRIED, BAKED OR BROILED IS PREFERABLE.  I SUGGEST WEARING SPF 50 SUNSCREEN ON EXPOSED PARTS AND ESPECIALLY WHEN IN THE DIRECT SUNLIGHT FOR AN EXTENDED PERIOD OF TIME.  PLEASE AVOID FAST FOOD RESTAURANTS AND INCREASE YOUR WATER INTAKE.  - CBC - Hemoglobin A1c - CMP14+EGFR - Lipid panel  2. Diabetic mononeuropathy associated with type 2 diabetes mellitus ([place]) Comments: Diabetic foot exam was performed. I DISCUSSED WITH  THE PATIENT AT LENGTH REGARDING THE GOALS OF GLYCEMIC CONTROL AND POSSIBLE LONG-TERM COMPLICATIONS.  I  ALSO STRESSED THE IMPORTANCE OF COMPLIANCE WITH HOME GLUCOSE MONITORING, DIETARY RESTRICTIONS INCLUDING  AVOIDANCE OF SUGARY DRINKS/PROCESSED FOODS,  ALONG WITH REGULAR EXERCISE.  I  ALSO STRESSED THE IMPORTANCE OF ANNUAL EYE EXAMS, SELF FOOT CARE AND COMPLIANCE WITH OFFICE VISITS.  - POCT Urinalysis Dipstick (81002) - POCT UA - Microalbumin  3. Chronic right shoulder pain Comments: She plans to have surgery in the near future. She was given rx tramadol to use prn.   4. Class 2 severe obesity due to excess calories with serious comorbidity and body mass index (BMI) of 35.0 to 35.9 in adult [facility]) Comments: She is encouraged to strive for BMI less than 30 to decrease cardiac risk. Advised to aim for at least 150 minutes of exercise per week.      Patient was given opportunity to ask questions. Patient verbalized understanding of the plan and was able to repeat key elements of the plan. All questions were answered to their satisfaction.   Michelle Greenland, MD   I, Michelle Greenland, MD, have reviewed all documentation for this visit. The documentation on 04/19/20 for the exam, diagnosis, procedures, and orders are all accurate and complete.  THE PATIENT IS ENCOURAGED TO PRACTICE SOCIAL DISTANCING DUE TO THE COVID-19 PANDEMIC.

## 2020-04-20 ENCOUNTER — Telehealth: Payer: Self-pay

## 2020-04-20 LAB — CMP14+EGFR
ALT: 19 IU/L (ref 0–32)
AST: 24 IU/L (ref 0–40)
Albumin/Globulin Ratio: 1.5 (ref 1.2–2.2)
Albumin: 4.1 g/dL (ref 3.8–4.8)
Alkaline Phosphatase: 110 IU/L (ref 44–121)
BUN/Creatinine Ratio: 18 (ref 9–23)
BUN: 10 mg/dL (ref 6–24)
Bilirubin Total: 0.3 mg/dL (ref 0.0–1.2)
CO2: 26 mmol/L (ref 20–29)
Calcium: 9.4 mg/dL (ref 8.7–10.2)
Chloride: 104 mmol/L (ref 96–106)
Creatinine, Ser: 0.57 mg/dL (ref 0.57–1.00)
GFR calc Af Amer: 126 mL/min/{1.73_m2} (ref >59)
GFR calc non Af Amer: 109 mL/min/{1.73_m2} (ref >59)
Globulin, Total: 2.8 g/dL (ref 1.5–4.5)
Glucose: 97 mg/dL (ref 65–99)
Potassium: 4.2 mmol/L (ref 3.5–5.2)
Sodium: 142 mmol/L (ref 134–144)
Total Protein: 6.9 g/dL (ref 6.0–8.5)

## 2020-04-20 LAB — LIPID PANEL
Chol/HDL Ratio: 2.2 ratio (ref 0.0–4.4)
Cholesterol, Total: 107 mg/dL (ref 100–199)
HDL: 49 mg/dL (ref >39)
LDL Chol Calc (NIH): 37 mg/dL (ref 0–99)
Triglycerides: 118 mg/dL (ref 0–149)
VLDL Cholesterol Cal: 21 mg/dL (ref 5–40)

## 2020-04-20 LAB — HEMOGLOBIN A1C
Est. average glucose Bld gHb Est-mCnc: 197 mg/dL
Hgb A1c MFr Bld: 8.5 % — ABNORMAL HIGH (ref 4.8–5.6)

## 2020-04-20 LAB — CBC
Hematocrit: 38.4 % (ref 34.0–46.6)
Hemoglobin: 12.8 g/dL (ref 11.1–15.9)
MCH: 27.8 pg (ref 26.6–33.0)
MCHC: 33.3 g/dL (ref 31.5–35.7)
MCV: 83 fL (ref 79–97)
Platelets: 358 10*3/uL (ref 150–450)
RBC: 4.61 x10E6/uL (ref 3.77–5.28)
RDW: 12.7 % (ref 11.7–15.4)
WBC: 13.8 10*3/uL — ABNORMAL HIGH (ref 3.4–10.8)

## 2020-04-20 NOTE — Telephone Encounter (Signed)
The pt left a message for Dr Baird Cancer that her Lyrica prescription can't be filled until Friday because of the change.  I called and left the pt a message to call the office back because Dr Baird Cancer wanted to know if the pt's insurance is effective today.

## 2020-04-27 ENCOUNTER — Other Ambulatory Visit (HOSPITAL_COMMUNITY): Payer: Self-pay | Admitting: Registered Nurse

## 2020-04-27 MED FILL — ETODOLAC 400 MG TABS: 400 | 14 days supply | Qty: 28 | Fill #0

## 2020-04-27 MED FILL — PREGABALIN 150 MG CAPS: 150 | 90 days supply | Qty: 270 | Fill #0

## 2020-04-27 MED FILL — tiZANidine HCL 4 MG TABS: 4 | 14 days supply | Qty: 14 | Fill #0

## 2020-04-29 ENCOUNTER — Ambulatory Visit: Payer: 59

## 2020-06-01 MED FILL — TRESIBA FLEXTOUCH 200 UNITS: 200 | 23 days supply | Qty: 9 | Fill #2

## 2020-06-02 MED FILL — UNIFINE PENTIPS 31GX3/16: 31G X 5 MM | 30 days supply | Qty: 100 | Fill #2

## 2020-06-24 MED FILL — VALACYCLOVIR HCL 500 MG TAB: 500 | 18 days supply | Qty: 36 | Fill #1

## 2020-07-20 ENCOUNTER — Other Ambulatory Visit: Payer: Self-pay

## 2020-07-20 ENCOUNTER — Ambulatory Visit (INDEPENDENT_AMBULATORY_CARE_PROVIDER_SITE_OTHER): Payer: Self-pay

## 2020-07-20 ENCOUNTER — Ambulatory Visit
Admission: EM | Admit: 2020-07-20 | Discharge: 2020-07-20 | Disposition: A | Discharge status: Home / Self Care | Payer: 59 | Attending: Emergency Medicine | Admitting: Emergency Medicine

## 2020-07-20 DIAGNOSIS — S59902A Unspecified injury of left elbow, initial encounter: Secondary | ICD-10-CM

## 2020-07-20 DIAGNOSIS — W19XXXA Unspecified fall, initial encounter: Secondary | ICD-10-CM

## 2020-07-20 DIAGNOSIS — S8011XA Contusion of right lower leg, initial encounter: Secondary | ICD-10-CM

## 2020-07-20 DIAGNOSIS — L089 Local infection of the skin and subcutaneous tissue, unspecified: Secondary | ICD-10-CM

## 2020-07-20 DIAGNOSIS — S161XXA Strain of muscle, fascia and tendon at neck level, initial encounter: Secondary | ICD-10-CM

## 2020-07-20 DIAGNOSIS — Z23 Encounter for immunization: Secondary | ICD-10-CM

## 2020-07-20 MED ORDER — DOXYCYCLINE HYCLATE 100 MG PO CAPS: 100.0000 mg | ORAL_CAPSULE | Freq: Two times a day (BID) | ORAL | 0 refills | Status: DC

## 2020-07-20 MED ORDER — TIZANIDINE HCL 2 MG PO TABS: 2.0000 mg | ORAL_TABLET | Freq: Every day | ORAL | 0 refills | Status: DC

## 2020-07-20 MED ORDER — TETANUS-DIPHTH-ACELL PERTUSSIS 5-2.5-18.5 LF-MCG/0.5 IM SUSY
0.5000 mL | PREFILLED_SYRINGE | Freq: Once | INTRAMUSCULAR | Status: AC
  Administered 2020-07-20: 0.5 mL via INTRAMUSCULAR

## 2020-07-20 NOTE — Discharge Instructions (Addendum)
Clean wound daily with soap and water. Avoid touching it as able to limit exposure to further infection.  Complete course of antibiotics.  Muscle relaxer before bed as needed to help with neck pain, limit use of this combined with tramadol to avoid oversedation.  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

## 2020-07-20 NOTE — ED Provider Notes (Signed)
Michelle Tanner    CSN: [id number] Arrival date & time: 07/20/20  1409      History   Chief Complaint Chief Complaint  Patient presents with  . Fall  . Arm Injury  . Neck Pain    HPI Michelle Tanner is a 50 y.o. female.   Michelle Tanner presents with complaints of left elbow pain, right shin pain as well as neck pain after a fall two days ago. She was walking and accidentally struck a trailer ramp with her shins, causing her to trip and fall onto cement, landing on her left elbow. She has had elbow and right shin pain since. Intermittent right neck pain with certain movements, which she noted yesterday. No numbness tingling or weakness of extremities, no previous elbow or leg injuries. Bruising to right superior anterior thigh. Redness and wound to right shin without tenderness surrounding. Left elbow pain with flexion, extension and with rotation of left lower arm. Has been taking tramadol and BC which have helped with pain.  History of left elbow fracture in childhood.     ROS per HPI, negative if not otherwise mentioned.      Past Medical History:  Diagnosis Date  . Back pain   . Diabetes mellitus   . Edema, lower extremity   . Fatty liver    per patient   . Fibromyalgia   . Frozen shoulder    left and right   . Hx of migraine headaches   . Joint pain   . Migraines   . Neuromuscular disorder ([place])    diabetic neuropathy  . Shoulder pain     Patient Active Problem List   Diagnosis Date Noted  . Backache 08/04/2019  . Vulvitis 08/04/2019  . Class 2 severe obesity due to excess calories with serious comorbidity and body mass index (BMI) of 35.0 to 35.9 in adult ([place]) 05/28/2019  . Essential (hemorrhagic) thrombocythemia ([place]) 03/13/2018  . Diabetic mononeuropathy associated with type 2 diabetes mellitus ([place]) 03/13/2018  . Tachycardia 03/10/2018  . IDA (iron deficiency anemia) 10/08/2017  . Herpes 10/24/2011  . Dysmenorrhea 10/11/2011  .  Excessive or frequent menstruation 10/11/2011    Past Surgical History:  Procedure Laterality Date  . ABLATION    . CESAREAN SECTION    . CYST EXCISION Right 02/17/2020   Procedure: EXCISION CYST RIGHT NECK;  Surgeon: Jovita Kussmaul, MD;  Location: [facility];  Service: General;  Laterality: Right;  . ENTEROSCOPY N/A 03/06/2019   Procedure: ENTEROSCOPY;  Surgeon: Carol Ada, MD;  Location: [facility];  Service: Endoscopy;  Laterality: N/A;  . INCISION AND DRAINAGE ABSCESS N/A 02/17/2020   Procedure: INCISION AND DRAINAGE PILONIDAL ABSCESS;  Surgeon: Jovita Kussmaul, MD;  Location: [facility];  Service: General;  Laterality: N/A;  . LAPAROSCOPIC ENDOMETRIOSIS FULGURATION    . POLYPECTOMY     Reports either [place] or Uterine.   Marland Kitchen UPPER ESOPHAGEAL ENDOSCOPIC ULTRASOUND (EUS)  03/06/2019   Procedure: ENDOSCOPIC ULTRASOUND (EUS);  Surgeon: Carol Ada, MD;  Location: [facility];  Service: Endoscopy;;  . WISDOM TOOTH EXTRACTION      OB History    Gravida  5   Para      Term      Preterm      AB  3   Living  1     SAB      IAB      Ectopic      Multiple  Live Births               Home Medications    Prior to Admission medications   Medication Sig Start Date End Date Taking? Authorizing Provider  dapagliflozin propanediol (FARXIGA) 10 MG TABS tablet Take 1 tablet (10 mg total) by mouth daily. 03/17/20  Yes Glendale Chard, MD  doxycycline (VIBRAMYCIN) 100 MG capsule Take 1 capsule (100 mg total) by mouth 2 (two) times daily. 07/20/20  Yes Elina Streng, Lanelle Bal B, NP  insulin degludec (TRESIBA FLEXTOUCH) 200 UNIT/ML FlexTouch Pen Inject 28 Units into the skin at bedtime. 11/23/19  Yes Glendale Chard, MD  pregabalin (LYRICA) 150 MG capsule One capsules po tid 04/19/20  Yes Glendale Chard, MD  Semaglutide, 1 MG/DOSE, (OZEMPIC, 1 MG/DOSE,) 2 MG/1.5ML SOPN Inject 1 mg into the skin once a week. 10/02/19  Yes Glendale Chard, MD  tiZANidine  (ZANAFLEX) 2 MG tablet Take 1 tablet (2 mg total) by mouth at bedtime. 07/20/20  Yes Augusto Gamble B, NP  traMADol (ULTRAM) 50 MG tablet Take 1 tablet (50 mg total) by mouth every 6 (six) hours as needed. 04/19/20 04/19/21 Yes Glendale Chard, MD  acetaminophen (TYLENOL) 650 MG CR tablet Take 650 mg by mouth every 8 (eight) hours as needed for pain.    [provider]  APPLE CIDER VINEGAR PO Take by mouth.    [provider]  Continuous Blood Gluc Receiver (FREESTYLE LIBRE 14 DAY READER) DEVI USE AS DIRECTED TO CHECK BLOOD SUGARS FOUR TIMES A DAY 01/18/20   Glendale Chard, MD  Continuous Blood Gluc Sensor (FREESTYLE LIBRE 2 SENSOR) MISC USE AS DIRECTED TO CHECK BLOOD SUGARS FOUR TIMES A DAY 04/19/20   Glendale Chard, MD  diclofenac Sodium (VOLTAREN) 1 % GEL Apply topically as needed.    [provider]  folic acid (FOLVITE) 1 MG tablet Take 1 tablet (1 mg total) by mouth daily. 03/17/20   Glendale Chard, MD  ibuprofen (ADVIL,MOTRIN) 800 MG tablet Take 800 mg by mouth every 8 (eight) hours as needed. For pain    [provider]  Iron-FA-B Cmp-C-Biot-Probiotic (FUSION PLUS) CAPS Take 1 capsule by mouth daily. 1 capsule daily 03/17/20   Glendale Chard, MD  linaclotide [facility]) 145 MCG CAPS capsule Take 145 mcg by mouth daily before breakfast.    [provider]  meloxicam (MOBIC) 7.5 MG tablet One tab po qd prn 10/15/19   Glendale Chard, MD  metFORMIN (GLUCOPHAGE) 1000 MG tablet TAKE 1 TABLET BY MOUTH TWO TIMES A DAY WITH MORNING AND EVENING MEALS 03/29/20   Glendale Chard, MD  Multiple Vitamin (MULITIVITAMIN WITH MINERALS) TABS Take 1 tablet by mouth daily.    [provider]  SUMAtriptan (IMITREX) 50 MG tablet Take 1 tablet (50 mg total) by mouth every 2 (two) hours as needed. 10/13/19   Glendale Chard, MD  UNIFINE PENTIPS PLUS 31G X 5 MM MISC Use as directed with Ozempic pen 10/02/19   Glendale Chard, MD  valACYclovir (VALTREX) 500 MG tablet Take 500 mg  by mouth 3 (three) times daily. 07/04/17   [provider]    Family History Family History  Problem Relation Age of Onset  . Rectal cancer Paternal Grandmother   . Diabetes Father   . Kidney disease Father   . CAD Father   . Hyperlipidemia Father   . Hypertension Father   . Heart disease Father   . Sudden death Father   . Cancer Mother   . Cancer Paternal Uncle   .  Cancer Paternal Aunt     Social History Social History   Tobacco Use  . Smoking status: Never Smoker  . Smokeless tobacco: Never Used  Vaping Use  . Vaping Use: Never used  Substance Use Topics  . Alcohol use: Yes    Comment: occasionally  about twice a year  . Drug use: No     Allergies   Latex, Peanut-containing drug products, and Shrimp [shellfish allergy]   Review of Systems Review of Systems   Physical Exam Triage Vital Signs ED Triage Vitals  Enc Vitals Group     BP 07/20/20 1428 122/81     Pulse Rate 07/20/20 1428 94     Resp 07/20/20 1428 18     Temp 07/20/20 1428 98.4 F (36.9 C)     Temp Source 07/20/20 1428 Oral     SpO2 07/20/20 1428 97 %     Weight --      Height --      Head Circumference --      Peak Flow --      Pain Score 07/20/20 1433 7     Pain Loc --      Pain Edu? --      Excl. in [place]? --    No data found.  Updated Vital Signs BP 122/81 (BP Location: Right Arm)   Pulse 94   Temp 98.4 F (36.9 C) (Oral)   Resp 18   LMP 06/22/2020   SpO2 97%    Physical Exam Constitutional:      General: She is not in acute distress.    Appearance: She is well-developed.  Cardiovascular:     Rate and Rhythm: Normal rate.  Pulmonary:     Effort: Pulmonary effort is normal.  Musculoskeletal:       Legs:     Comments: Healing wound with surrounding redness, no active drainage, some necrotic tissue to base of wound, surrounding tenderness to right lower leg; left elbow with tenderness to olecranon and lateral epicondyle with pain with flexion and extension, comfort  at 90 deg flexion; pain with lower arm rotation; right perispinous neck tenderness without bony tenderness step off or deformity; full ROM of neck without difficulty  Skin:    General: Skin is warm and dry.  Neurological:     Mental Status: She is alert and oriented to person, place, and time.      UC Treatments / Results  Labs (all labs ordered are listed, but only abnormal results are displayed) Labs Reviewed - No data to display  EKG   Radiology DG Elbow Complete Left  Result Date: 07/20/2020 CLINICAL DATA:  Left elbow pain 2 days after fall, swelling EXAM: LEFT ELBOW - COMPLETE 3+ VIEW COMPARISON:  None. FINDINGS: Frontal, bilateral oblique, lateral views of the left elbow are obtained. Evaluation slightly limited due to cassette artifact. No fracture, subluxation, or dislocation. Moderate diffuse osteoarthritis. Equivocal findings for joint effusion. Soft tissues are unremarkable. IMPRESSION: 1. Moderate diffuse osteoarthritis. 2. No acute displaced fracture. If nondisplaced fracture remains a clinical concern, delayed radiographs in 7-10 days may be useful after conservative management, as it may take this time for fracture line to become radiographically apparent. 3. Equivocal findings of joint effusion. Evaluation is limited by cassette artifact. Electronically Signed   By: Randa Ngo M.D.   On: 07/20/2020 16:44   DG Tibia/Fibula Right  Result Date: 07/20/2020 CLINICAL DATA:  Right lower leg pain after fall EXAM: RIGHT TIBIA AND FIBULA - 2 VIEW  COMPARISON:  None. FINDINGS: No fracture or malalignment. Plantar calcaneal spur. No radiopaque foreign body in the soft tissues. IMPRESSION: No acute osseous abnormality Electronically Signed   By: Donavan Foil M.D.   On: 07/20/2020 16:38    Procedures Procedures (including critical care time)  Medications Ordered in UC Medications  Tdap (BOOSTRIX) injection 0.5 mL (0.5 mLs Intramuscular Given 07/20/20 1556)    Initial Impression /  Assessment and Plan / UC Course  I have reviewed the triage vital signs and the nursing notes.  Pertinent labs & imaging results that were available during my care of the patient were reviewed by me and considered in my medical decision making (see chart for details).     Concern for infection to right lower leg, with antibiotics provided. Muscle relaxer provided for right neck pain. No acute findings on left elbow xray, but with exam and history of fracture, and questionable effusion, recommend further evaluation with orthopedics for work clearance etc. Sling provided here today, she states she will go to [facility] now. Return precautions provided. Patient verbalized understanding and agreeable to plan.  Ambulatory out of clinic without difficulty.    Final Clinical Impressions(s) / UC Diagnoses   Final diagnoses:  Fall, initial encounter  Infected wound  Acute strain of neck muscle, initial encounter  Contusion of multiple sites of right lower extremity, initial encounter  Injury of left elbow, initial encounter     Discharge Instructions     Clean wound daily with soap and water. Avoid touching it as able to limit exposure to further infection.  Complete course of antibiotics.  Muscle relaxer before bed as needed to help with neck pain, limit use of this combined with tramadol to avoid oversedation.  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      ED Prescriptions    Medication Sig Dispense Auth. Provider   doxycycline (VIBRAMYCIN) 100 MG capsule Take 1 capsule (100 mg total) by mouth 2 (two) times daily. 20 capsule Augusto Gamble B, NP   tiZANidine (ZANAFLEX) 2 MG tablet Take 1 tablet (2 mg total) by mouth at bedtime. 20 tablet Zigmund Gottron, NP     PDMP not reviewed this encounter.    Zigmund Gottron, NP 07/20/20 1659

## 2020-07-20 NOTE — ED Triage Notes (Addendum)
Pt c/o pain to left elbow radiating to upper left arm, posterior neck pain s/p fall on Monday. Pt states she tripped over metal trailer ramp and fell into metal with right leg, causing large ecchymosis area to anterior left thigh, and puncture/laceration to right tibia area. Pt sates she also struck her left knee and braced with left arm as she fell.   Point tenderness to posterior neck region and b/t scapula, selling noted to left elbow, point tenderness to lateral/posterior area of elbow. Ltd ROM to left elbow  Denies LOC or head injury at time of fall, numbness to extremities.

## 2020-07-20 NOTE — ED Notes (Signed)
Sling placed to left arm

## 2020-07-25 ENCOUNTER — Ambulatory Visit: Payer: 59 | Admitting: Internal Medicine

## 2020-07-27 ENCOUNTER — Encounter: Payer: Self-pay | Admitting: Internal Medicine

## 2020-07-27 ENCOUNTER — Other Ambulatory Visit (HOSPITAL_COMMUNITY): Payer: Self-pay | Admitting: Internal Medicine

## 2020-07-27 ENCOUNTER — Ambulatory Visit (INDEPENDENT_AMBULATORY_CARE_PROVIDER_SITE_OTHER): Payer: Self-pay | Admitting: Internal Medicine

## 2020-07-27 ENCOUNTER — Other Ambulatory Visit: Payer: Self-pay

## 2020-07-27 VITALS — BP 132/88 | HR 85 | Temp 98.0°F | Ht 60.8 in | Wt 184.0 lb

## 2020-07-27 DIAGNOSIS — Z6835 Body mass index (BMI) 35.0-35.9, adult: Secondary | ICD-10-CM

## 2020-07-27 DIAGNOSIS — R040 Epistaxis: Secondary | ICD-10-CM

## 2020-07-27 DIAGNOSIS — S42402D Unspecified fracture of lower end of left humerus, subsequent encounter for fracture with routine healing: Secondary | ICD-10-CM

## 2020-07-27 DIAGNOSIS — E1141 Type 2 diabetes mellitus with diabetic mononeuropathy: Secondary | ICD-10-CM

## 2020-07-27 DIAGNOSIS — D560 Alpha thalassemia: Secondary | ICD-10-CM

## 2020-07-27 MED ORDER — METFORMIN HCL 1000 MG PO TABS: ORAL_TABLET | ORAL | 2 refills | Status: DC

## 2020-07-27 MED ORDER — TRESIBA FLEXTOUCH 200 UNIT/ML ~~LOC~~ SOPN: 28.0000 [IU] | PEN_INJECTOR | Freq: Every day | SUBCUTANEOUS | 3 refills | Status: DC

## 2020-07-27 MED ORDER — OZEMPIC (1 MG/DOSE) 2 MG/1.5ML ~~LOC~~ SOPN: 1.0000 mg | PEN_INJECTOR | SUBCUTANEOUS | 5 refills | Status: DC

## 2020-07-27 MED FILL — METFORMIN HCL 1000 MG TABS: 1000 | 30 days supply | Qty: 60 | Fill #0

## 2020-07-27 NOTE — Patient Instructions (Signed)
Diabetes Mellitus and Foot Care Foot care is an important part of your health, especially when you have diabetes. Diabetes may cause you to have problems because of poor blood flow (circulation) to your feet and legs, which can cause your skin to:  Become thinner and drier.  Break more easily.  Heal more slowly.  Peel and crack. You may also have nerve damage (neuropathy) in your legs and feet, causing decreased feeling in them. This means that you may not notice minor injuries to your feet that could lead to more serious problems. Noticing and addressing any potential problems early is the best way to prevent future foot problems. How to care for your feet Foot hygiene  Wash your feet daily with warm water and mild soap. Do not use hot water. Then, pat your feet and the areas between your toes until they are completely dry. Do not soak your feet as this can dry your skin.  Trim your toenails straight across. Do not dig under them or around the cuticle. File the edges of your nails with an emery board or nail file.  Apply a moisturizing lotion or petroleum jelly to the skin on your feet and to dry, brittle toenails. Use lotion that does not contain alcohol and is unscented. Do not apply lotion between your toes.   Shoes and socks  Wear clean socks or stockings every day. Make sure they are not too tight. Do not wear knee-high stockings since they may decrease blood flow to your legs.  Wear shoes that fit properly and have enough cushioning. Always look in your shoes before you put them on to be sure there are no objects inside.  To break in new shoes, wear them for just a few hours a day. This prevents injuries on your feet. Wounds, scrapes, corns, and calluses  Check your feet daily for blisters, cuts, bruises, sores, and redness. If you cannot see the bottom of your feet, use a mirror or ask someone for help.  Do not cut corns or calluses or try to remove them with medicine.  If you  find a minor scrape, cut, or break in the skin on your feet, keep it and the skin around it clean and dry. You may clean these areas with mild soap and water. Do not clean the area with peroxide, alcohol, or iodine.  If you have a wound, scrape, corn, or callus on your foot, look at it several times a day to make sure it is healing and not infected. Check for: ? Redness, swelling, or pain. ? Fluid or blood. ? Warmth. ? Pus or a bad smell.   General tips  Do not cross your legs. This may decrease blood flow to your feet.  Do not use heating pads or hot water bottles on your feet. They may burn your skin. If you have lost feeling in your feet or legs, you may not know this is happening until it is too late.  Protect your feet from hot and cold by wearing shoes, such as at the beach or on hot pavement.  Schedule a complete foot exam at least once a year (annually) or more often if you have foot problems. Report any cuts, sores, or bruises to your health care provider immediately. Where to find more information  American Diabetes Association: www.diabetes.org  Association of Diabetes Care & Education Specialists: www.diabeteseducator.org Contact a health care provider if:  You have a medical condition that increases your risk of infection and   you have any cuts, sores, or bruises on your feet.  You have an injury that is not healing.  You have redness on your legs or feet.  You feel burning or tingling in your legs or feet.  You have pain or cramps in your legs and feet.  Your legs or feet are numb.  Your feet always feel cold.  You have pain around any toenails. Get help right away if:  You have a wound, scrape, corn, or callus on your foot and: ? You have pain, swelling, or redness that gets worse. ? You have fluid or blood coming from the wound, scrape, corn, or callus. ? Your wound, scrape, corn, or callus feels warm to the touch. ? You have pus or a bad smell coming from  the wound, scrape, corn, or callus. ? You have a fever. ? You have a red line going up your leg. Summary  Check your feet every day for blisters, cuts, bruises, sores, and redness.  Apply a moisturizing lotion or petroleum jelly to the skin on your feet and to dry, brittle toenails.  Wear shoes that fit properly and have enough cushioning.  If you have foot problems, report any cuts, sores, or bruises to your health care provider immediately.  Schedule a complete foot exam at least once a year (annually) or more often if you have foot problems. This information is not intended to replace advice given to you by your health care provider. Make sure you discuss any questions you have with your health care provider. Document Revised: 11/26/2019 Document Reviewed: 11/26/2019 Elsevier Patient Education  2021 [employer]  

## 2020-07-27 NOTE — Progress Notes (Signed)
I,[name],acting as a Education administrator for [name], MD.,have documented all relevant documentation on the behalf of [name], MD,as directed by  [name], MD while in the presence of [name], MD.  This visit occurred during the SARS-CoV-2 public health emergency.  Safety protocols were in place, including screening questions prior to the visit, additional usage of staff PPE, and extensive cleaning of exam room while observing appropriate contact time as indicated for disinfecting solutions.  Subjective:     Patient ID: Michelle Tanner , female    DOB: [birth year] , 50 y.o.   MRN: [id number]   Chief Complaint  Patient presents with  . Diabetes    HPI  The patient is here today for a diabetes f/u. She admits she has not been taking meds as regularly. She needs refill on metformin, Tresiba and Ozempic. She has a new job, getting used to new schedule.   Diabetes She presents for her follow-up diabetic visit. She has type 2 diabetes mellitus. There are no hypoglycemic associated symptoms. Associated symptoms include foot paresthesias. There are no hypoglycemic complications. Risk factors for coronary artery disease include diabetes mellitus, sedentary lifestyle, stress and obesity. Current diabetic treatment includes insulin injections. She is compliant with treatment most of the time. She is following a generally healthy diet. She never participates in exercise. Her breakfast blood glucose is taken between 8-9 am. Her breakfast blood glucose range is generally 110-130 mg/dl. An ACE inhibitor/angiotensin II receptor blocker is not being taken. She does not see a podiatrist.Eye exam is not current.     Past Medical History:  Diagnosis Date  . Back pain   . Diabetes mellitus   . Edema, lower extremity   . Fatty liver    per patient   . Fibromyalgia   . Frozen shoulder    left and right   . Hx of migraine headaches   . Joint pain   . Migraines   .  Neuromuscular disorder ([place])    diabetic neuropathy  . Shoulder pain      Family History  Problem Relation Age of Onset  . Rectal cancer Paternal Grandmother   . Diabetes Father   . Kidney disease Father   . CAD Father   . Hyperlipidemia Father   . Hypertension Father   . Heart disease Father   . Sudden death Father   . Cancer Mother   . Cancer Paternal Uncle   . Cancer Paternal Aunt      Current Outpatient Medications:  .  acetaminophen (TYLENOL) 650 MG CR tablet, Take 650 mg by mouth every 8 (eight) hours as needed for pain., Disp: , Rfl:  .  APPLE CIDER VINEGAR PO, Take by mouth., Disp: , Rfl:  .  Continuous Blood Gluc Receiver (FREESTYLE LIBRE 14 DAY READER) DEVI, USE AS DIRECTED TO CHECK BLOOD SUGARS FOUR TIMES A DAY, Disp: 3 each, Rfl: 0 .  Continuous Blood Gluc Sensor (FREESTYLE LIBRE 2 SENSOR) MISC, USE AS DIRECTED TO CHECK BLOOD SUGARS FOUR TIMES A DAY, Disp: 3 each, Rfl: 1 .  dapagliflozin propanediol (FARXIGA) 10 MG TABS tablet, Take 1 tablet (10 mg total) by mouth daily., Disp: 90 tablet, Rfl: 2 .  diclofenac Sodium (VOLTAREN) 1 % GEL, Apply topically as needed., Disp: , Rfl:  .  doxycycline (VIBRAMYCIN) 100 MG capsule, Take 1 capsule (100 mg total) by mouth 2 (two) times daily., Disp: 20 capsule, Rfl: 0 .  folic acid (FOLVITE) 1 MG tablet,  Take 1 tablet (1 mg total) by mouth daily., Disp: 90 tablet, Rfl: 1 .  ibuprofen (ADVIL,MOTRIN) 800 MG tablet, Take 800 mg by mouth every 8 (eight) hours as needed. For pain, Disp: , Rfl:  .  Iron-FA-B Cmp-C-Biot-Probiotic (FUSION PLUS) CAPS, Take 1 capsule by mouth daily. 1 capsule daily, Disp: 90 capsule, Rfl: 1 .  linaclotide (LINZESS) 145 MCG CAPS capsule, Take 145 mcg by mouth daily before breakfast., Disp: , Rfl:  .  meloxicam (MOBIC) 7.5 MG tablet, One tab po qd prn, Disp: 30 tablet, Rfl: 0 .  Multiple Vitamin (MULITIVITAMIN WITH MINERALS) TABS, Take 1 tablet by mouth daily., Disp: , Rfl:  .  pregabalin (LYRICA) 150 MG  capsule, One capsules po tid, Disp: 270 capsule, Rfl: 2 .  SUMAtriptan (IMITREX) 50 MG tablet, Take 1 tablet (50 mg total) by mouth every 2 (two) hours as needed., Disp: 10 tablet, Rfl: 2 .  tiZANidine (ZANAFLEX) 2 MG tablet, Take 1 tablet (2 mg total) by mouth at bedtime., Disp: 20 tablet, Rfl: 0 .  traMADol (ULTRAM) 50 MG tablet, Take 1 tablet (50 mg total) by mouth every 6 (six) hours as needed., Disp: 30 tablet, Rfl: 0 .  UNIFINE PENTIPS PLUS 31G X 5 MM MISC, Use as directed with Ozempic pen, Disp: 90 each, Rfl: 2 .  valACYclovir (VALTREX) 500 MG tablet, Take 500 mg by mouth 3 (three) times daily., Disp: , Rfl: 0 .  insulin degludec (TRESIBA FLEXTOUCH) 200 UNIT/ML FlexTouch Pen, Inject 28 Units into the skin at bedtime., Disp: 3 mL, Rfl: 3 .  metFORMIN (GLUCOPHAGE) 1000 MG tablet, TAKE 1 TABLET BY MOUTH TWO TIMES A DAY WITH MORNING AND EVENING MEALS, Disp: 180 tablet, Rfl: 2 .  Semaglutide (RYBELSUS) 7 MG TABS, Take one tablet by mouth daily upon awakening, wait 30 minutes prior to eating/drinking and taking other meds., Disp: 30 tablet, Rfl: 1 .  Semaglutide, 1 MG/DOSE, (OZEMPIC, 1 MG/DOSE,) 2 MG/1.5ML SOPN, Inject 1 mg into the skin once a week., Disp: 1.5 mL, Rfl: 5   Allergies  Allergen Reactions  . Latex Hives  . Peanut-Containing Drug Products   . Shrimp [Shellfish Allergy]      Review of Systems  Constitutional: Negative.   HENT: Positive for nosebleeds.        She reports having recent nosebleeds. Denies trauma, did have fall on 2/28 - she does not believe this is cause of her sx.   Respiratory: Negative.   Cardiovascular: Negative.   Gastrointestinal: Negative.   Musculoskeletal: Positive for arthralgias.       She c/o left elbow pain. Patient states pain started on Monday, 07/18/2020 after she fell. She was walking and accidentally struck a trailer ramp with her shins, causing her to trip and fall onto cement, landing on her left elbow. She did try to catch herself w/ her left  hand.  She did not immediately seek medical attention. She went to ED on 07/20/20. Complains of constant pain diffusely through the elbow. She has attempted management with tramadol and BC powder. Pain currently rated 7/10.  Psychiatric/Behavioral: Negative.   All other systems reviewed and are negative.    Today's Vitals   07/27/20 0943  BP: 132/88  Pulse: 85  Temp: 98 F (36.7 C)  TempSrc: Oral  Weight: 184 lb (83.5 kg)  Height: 5' 0.8" (1.544 m)   Body mass index is 35 kg/m.  Wt Readings from Last 3 Encounters:  07/27/20 184 lb (83.5 kg)  04/19/20 188  lb (85.3 kg)  02/17/20 189 lb 2.5 oz (85.8 kg)   Objective:  Physical Exam Vitals and nursing note reviewed.  Constitutional:      Appearance: Normal appearance. She is obese.  HENT:     Head: Normocephalic and atraumatic.     Nose:     Comments: Masked     Mouth/Throat:     Comments: Masked  Cardiovascular:     Rate and Rhythm: Normal rate and regular rhythm.     Heart sounds: Normal heart sounds.  Pulmonary:     Breath sounds: Normal breath sounds.  Musculoskeletal:     Cervical back: Normal range of motion.  Skin:    General: Skin is warm.  Neurological:     General: No focal deficit present.     Mental Status: She is alert and oriented to person, place, and time.         Assessment And Plan:     1. Diabetic mononeuropathy associated with type 2 diabetes mellitus ([place]) Comments: Chronic, I will check labs as listed below. I will adjust meds as needed. Importance of dietary/medication compliance was d/w patient.  - BMP8+EGFR - Hemoglobin A1c  2. Alpha thalassemia ([place]) Comments: She has been evaluated Hematology in the past.   3. Closed fracture of left elbow with routine healing, subsequent encounter Comments: ED records reviewed. I will check vitamin d level and supplement as needed. She will continue under care of [facility].  - Vitamin D (25 hydroxy)  4. Epistaxis Comments: Advised to let me know  if her sx persist. Advised to put Vaseline in nostrils at night. Will refer to ENT should his sx persist.  - CBC no Diff  5. Class 2 severe obesity due to excess calories with serious comorbidity and body mass index (BMI) of 35.0 to 35.9 in adult [facility])  She is encouraged to strive for BMI less than 30 to decrease cardiac risk. Advised to aim for at least 150 minutes of exercise per week.   Patient was given opportunity to ask questions. Patient verbalized understanding of the plan and was able to repeat key elements of the plan. All questions were answered to their satisfaction.   I, [name], MD, have reviewed all documentation for this visit. The documentation on 07/27/20 for the exam, diagnosis, procedures, and orders are all accurate and complete.  THE PATIENT IS ENCOURAGED TO PRACTICE SOCIAL DISTANCING DUE TO THE COVID-19 PANDEMIC.

## 2020-07-28 LAB — BMP8+EGFR
BUN/Creatinine Ratio: 19 (ref 9–23)
BUN: 11 mg/dL (ref 6–24)
CO2: 22 mmol/L (ref 20–29)
Calcium: 9.5 mg/dL (ref 8.7–10.2)
Chloride: 99 mmol/L (ref 96–106)
Creatinine, Ser: 0.58 mg/dL (ref 0.57–1.00)
Glucose: 146 mg/dL — ABNORMAL HIGH (ref 65–99)
Potassium: 4 mmol/L (ref 3.5–5.2)
Sodium: 138 mmol/L (ref 134–144)
eGFR: 111 mL/min/{1.73_m2} (ref >59)

## 2020-07-28 LAB — CBC
Hematocrit: 43.5 % (ref 34.0–46.6)
Hemoglobin: 13.9 g/dL (ref 11.1–15.9)
MCH: 26.5 pg — ABNORMAL LOW (ref 26.6–33.0)
MCHC: 32 g/dL (ref 31.5–35.7)
MCV: 83 fL (ref 79–97)
Platelets: 351 10*3/uL (ref 150–450)
RBC: 5.25 x10E6/uL (ref 3.77–5.28)
RDW: 13 % (ref 11.7–15.4)
WBC: 12.4 10*3/uL — ABNORMAL HIGH (ref 3.4–10.8)

## 2020-07-28 LAB — HEMOGLOBIN A1C
Est. average glucose Bld gHb Est-mCnc: 197 mg/dL
Hgb A1c MFr Bld: 8.5 % — ABNORMAL HIGH (ref 4.8–5.6)

## 2020-07-28 LAB — VITAMIN D 25 HYDROXY (VIT D DEFICIENCY, FRACTURES): Vit D, 25-Hydroxy: 41.1 ng/mL (ref 30.0–100.0)

## 2020-07-29 ENCOUNTER — Telehealth: Payer: Self-pay

## 2020-07-29 MED FILL — TRESIBA FLEXTOUCH 200 UNITS: 200 | 21 days supply | Qty: 3 | Fill #0

## 2020-07-29 NOTE — Telephone Encounter (Signed)
-----   Message from Glendale Chard, MD sent at 07/28/2020  5:26 PM EST ----- LET PT Michelle Tanner. She needs to call her insurance and find out how much her Tanner is. Is she willing to try Rybelsus? Its once daily, upon awakening.   RS ----- Message ----- From: Michelle Nasuti, CMA Sent: 07/28/2020   4:56 PM EST To: Glendale Chard, MD  Ozempic [phone], with coupon [phone], Byduron $709, Trulicity $ 643 is the prices for the meds with the pt's new in insurance.

## 2020-08-02 ENCOUNTER — Other Ambulatory Visit: Payer: Self-pay

## 2020-08-02 ENCOUNTER — Other Ambulatory Visit (HOSPITAL_COMMUNITY): Payer: Self-pay | Admitting: Internal Medicine

## 2020-08-02 MED ORDER — RYBELSUS 7 MG PO TABS: ORAL_TABLET | ORAL | 1 refills | Status: DC

## 2020-08-03 ENCOUNTER — Telehealth: Payer: Self-pay

## 2020-08-03 NOTE — Telephone Encounter (Signed)
The pt was notified that Dr. Baird Cancer sent rybelsus to the pharmacy, the pt is going to activate the savings card and call the office back if it's any issues.

## 2020-08-08 MED FILL — FREESTYLE LIBRE 2 SENSOR MI: 28 days supply | Qty: 2 | Fill #1

## 2020-08-24 ENCOUNTER — Other Ambulatory Visit (HOSPITAL_COMMUNITY): Payer: Self-pay

## 2020-09-15 ENCOUNTER — Other Ambulatory Visit: Payer: Self-pay | Admitting: Internal Medicine

## 2020-09-15 ENCOUNTER — Other Ambulatory Visit: Payer: Self-pay

## 2020-09-15 ENCOUNTER — Encounter: Payer: Self-pay | Admitting: Internal Medicine

## 2020-09-15 MED ORDER — DAPAGLIFLOZIN PROPANEDIOL 10 MG PO TABS: ORAL_TABLET | Freq: Every day | ORAL | 2 refills | Status: AC

## 2020-09-15 MED ORDER — UNIFINE PENTIPS PLUS 31G X 5 MM MISC: 2 refills | Status: DC

## 2020-09-15 MED ORDER — FREESTYLE LIBRE 2 SENSOR MISC: 1 refills | Status: AC

## 2020-09-15 MED ORDER — FREESTYLE LIBRE 2 SENSOR MISC: 1 refills | Status: DC

## 2020-09-15 MED ORDER — UNIFINE PENTIPS PLUS 31G X 5 MM MISC: 2 refills | Status: AC

## 2020-09-15 MED ORDER — FREESTYLE LIBRE 14 DAY READER DEVI: 0 refills | Status: AC

## 2020-09-15 MED ORDER — METFORMIN HCL 1000 MG PO TABS: ORAL_TABLET | ORAL | 2 refills | Status: DC

## 2020-09-15 MED ORDER — DAPAGLIFLOZIN PROPANEDIOL 10 MG PO TABS: 10.0000 mg | ORAL_TABLET | Freq: Every day | ORAL | 2 refills | Status: DC

## 2020-09-15 MED ORDER — FREESTYLE LIBRE 14 DAY READER DEVI: 0 refills | Status: DC

## 2020-09-15 MED ORDER — OZEMPIC (1 MG/DOSE) 2 MG/1.5ML ~~LOC~~ SOPN: 1.0000 mg | PEN_INJECTOR | SUBCUTANEOUS | 5 refills | Status: DC

## 2020-09-15 MED ORDER — TRESIBA FLEXTOUCH 200 UNIT/ML ~~LOC~~ SOPN: 28.0000 [IU] | PEN_INJECTOR | Freq: Every day | SUBCUTANEOUS | 3 refills | Status: DC

## 2020-09-16 ENCOUNTER — Other Ambulatory Visit: Payer: Self-pay | Admitting: Internal Medicine

## 2020-09-16 MED ORDER — PREGABALIN 150 MG PO CAPS: ORAL_CAPSULE | Freq: Three times a day (TID) | ORAL | 2 refills | Status: DC

## 2020-09-19 ENCOUNTER — Encounter: Payer: Self-pay | Admitting: Internal Medicine

## 2020-09-20 ENCOUNTER — Other Ambulatory Visit: Payer: Self-pay

## 2020-09-20 ENCOUNTER — Other Ambulatory Visit (HOSPITAL_COMMUNITY): Payer: Self-pay

## 2020-09-20 MED ORDER — FUSION PLUS PO CAPS: 1.0000 | ORAL_CAPSULE | Freq: Every day | ORAL | 1 refills | Status: AC

## 2020-09-20 MED ORDER — INSULIN DEGLUDEC 200 UNIT/ML ~~LOC~~ SOPN
30.0000 [IU] | PEN_INJECTOR | Freq: Every day | SUBCUTANEOUS | 3 refills | Status: DC
  Filled 2020-09-20: qty 9, 23d supply, fill #0
  Filled 2020-09-21: qty 9, 22d supply, fill #0
  Filled 2020-10-07: qty 9, 30d supply, fill #0
  Filled 2021-07-13: qty 9, 23d supply, fill #0

## 2020-09-20 MED ORDER — METFORMIN HCL 1000 MG PO TABS
1000.0000 mg | ORAL_TABLET | Freq: Two times a day (BID) | ORAL | 0 refills | Status: DC
  Filled 2020-09-20 – 2020-10-07 (×2): qty 60, 30d supply, fill #0

## 2020-09-20 MED ORDER — METFORMIN HCL 1000 MG PO TABS: 1000.0000 mg | ORAL_TABLET | Freq: Two times a day (BID) | ORAL | 1 refills | Status: AC

## 2020-09-21 ENCOUNTER — Other Ambulatory Visit (HOSPITAL_COMMUNITY): Payer: Self-pay

## 2020-10-04 ENCOUNTER — Encounter: Payer: Self-pay | Admitting: Internal Medicine

## 2020-10-07 ENCOUNTER — Other Ambulatory Visit (HOSPITAL_COMMUNITY): Payer: Self-pay

## 2020-10-27 ENCOUNTER — Ambulatory Visit (INDEPENDENT_AMBULATORY_CARE_PROVIDER_SITE_OTHER): Payer: Self-pay | Admitting: Internal Medicine

## 2020-10-27 ENCOUNTER — Other Ambulatory Visit: Payer: Self-pay

## 2020-10-27 ENCOUNTER — Encounter: Payer: Self-pay | Admitting: Internal Medicine

## 2020-10-27 VITALS — BP 124/82 | HR 77 | Temp 98.0°F | Ht 60.8 in | Wt 176.2 lb

## 2020-10-27 DIAGNOSIS — E1141 Type 2 diabetes mellitus with diabetic mononeuropathy: Secondary | ICD-10-CM

## 2020-10-27 DIAGNOSIS — D72829 Elevated white blood cell count, unspecified: Secondary | ICD-10-CM

## 2020-10-27 DIAGNOSIS — R140 Abdominal distension (gaseous): Secondary | ICD-10-CM

## 2020-10-27 DIAGNOSIS — Z6833 Body mass index (BMI) 33.0-33.9, adult: Secondary | ICD-10-CM

## 2020-10-27 DIAGNOSIS — D508 Other iron deficiency anemias: Secondary | ICD-10-CM

## 2020-10-27 DIAGNOSIS — E6609 Other obesity due to excess calories: Secondary | ICD-10-CM

## 2020-10-27 LAB — POCT URINALYSIS DIPSTICK
Bilirubin, UA: NEGATIVE
Blood, UA: NEGATIVE
Glucose, UA: POSITIVE — AB
Ketones, UA: NEGATIVE
Leukocytes, UA: NEGATIVE
Nitrite, UA: NEGATIVE
Protein, UA: NEGATIVE
Spec Grav, UA: 1.01 (ref 1.010–1.025)
Urobilinogen, UA: 0.2 E.U./dL
pH, UA: 5.5 (ref 5.0–8.0)

## 2020-10-27 LAB — POCT UA - MICROALBUMIN
Albumin/Creatinine Ratio, Urine, POC: <30
Creatinine, POC: 100 mg/dL
Microalbumin Ur, POC: 10 mg/L

## 2020-10-27 NOTE — Patient Instructions (Signed)
Probiotics -Florastor, Align  Corns and Calluses Corns are small areas of thickened skin that form on the top, sides, or tip of a toe. Corns have a cone-shaped core with a point that can press on a nervebelow. This causes pain. Calluses are areas of thickened skin that can form anywhere on the body, including the hands, fingers, palms, soles of the feet, and heels. Calluses areusually larger than corns. What are the causes? Corns and calluses are caused by rubbing (friction) or pressure, such as from shoes that are too tight or do not fit properly. What increases the risk? Corns are more likely to develop in people who have misshapen toes (toe deformities), such as hammer toes. Calluses can form with friction to any area of the skin. They are more likely to develop in people who: Work with their hands. Wear shoes that fit poorly, are too tight, or are high-heeled. Have toe deformities. What are the signs or symptoms? Symptoms of a corn or callus include: A hard growth on the skin. Pain or tenderness under the skin. Redness and swelling. Increased discomfort while wearing tight-fitting shoes, if your feet are affected. If a corn or callus becomes infected, symptoms may include: Redness and swelling that gets worse. Pain. Fluid, blood, or pus draining from the corn or callus. How is this diagnosed? Corns and calluses may be diagnosed based on your symptoms, your medicalhistory, and a physical exam. How is this treated? Treatment for corns and calluses may include: Removing the cause of the friction or pressure. This may involve: Changing your shoes. Wearing shoe inserts (orthotics) or other protective layers in your shoes, such as a corn pad. Wearing gloves. Applying medicine to the skin (topical medicine) to help soften skin in the hardened, thickened areas. Removing layers of dead skin with a file to reduce the size of the corn or callus. Removing the corn or callus with a scalpel or  laser. Taking antibiotic medicines, if your corn or callus is infected. Having surgery, if a toe deformity is the cause. Follow these instructions at home:  Take over-the-counter and prescription medicines only as told by your health care provider. If you were prescribed an antibiotic medicine, take it as told by your health care provider. Do not stop taking it even if your condition improves. Wear shoes that fit well. Avoid wearing high-heeled shoes and shoes that are too tight or too loose. Wear any padding, protective layers, gloves, or orthotics as told by your health care provider. Soak your hands or feet. Then use a file or pumice stone to soften your corn or callus. Do this as told by your health care provider. Check your corn or callus every day for signs of infection. Contact a health care provider if: Your symptoms do not improve with treatment. You have redness or swelling that gets worse. Your corn or callus becomes painful. You have fluid, blood, or pus coming from your corn or callus. You have new symptoms. Get help right away if: You develop severe pain with redness. Summary Corns are small areas of thickened skin that form on the top, sides, or tip of a toe. These can be painful. Calluses are areas of thickened skin that can form anywhere on the body, including the hands, fingers, palms, and soles of the feet. Calluses are usually larger than corns. Corns and calluses are caused by rubbing (friction) or pressure, such as from shoes that are too tight or do not fit properly. Treatment may include wearing padding,  protective layers, gloves, or orthotics as told by your health care provider. This information is not intended to replace advice given to you by your health care provider. Make sure you discuss any questions you have with your healthcare provider. Document Revised: 09/03/2019 Document Reviewed: 09/03/2019 Elsevier Patient Education  2022 [employer].

## 2020-10-27 NOTE — Progress Notes (Signed)
I,[name],acting as a Education administrator for [name], MD.,have documented all relevant documentation on the behalf of [name], MD,as directed by  [name], MD while in the presence of [name], MD.  This visit occurred during the SARS-CoV-2 public health emergency.  Safety protocols were in place, including screening questions prior to the visit, additional usage of staff PPE, and extensive cleaning of exam room while observing appropriate contact time as indicated for disinfecting solutions.  Subjective:     Patient ID: Michelle Tanner , female    DOB: [birth year] , 50 y.o.   MRN: [id number]   Chief Complaint  Patient presents with   Diabetes    HPI  The patient is here today for a diabetes f/u. She reports compliance with meds.   Diabetes She presents for her follow-up diabetic visit. She has type 2 diabetes mellitus. There are no hypoglycemic associated symptoms. Associated symptoms include foot paresthesias. There are no hypoglycemic complications. Risk factors for coronary artery disease include diabetes mellitus, sedentary lifestyle, stress and obesity. Current diabetic treatment includes insulin injections. She is compliant with treatment most of the time. She is following a generally healthy diet. She never participates in exercise. Her breakfast blood glucose is taken between 8-9 am. Her breakfast blood glucose range is generally 110-130 mg/dl. An ACE inhibitor/angiotensin II receptor blocker is not being taken. She does not see a podiatrist.Eye exam is not current.    Past Medical History:  Diagnosis Date   Back pain    Diabetes mellitus    Edema, lower extremity    Fatty liver    per patient    Fibromyalgia    Frozen shoulder    left and right    Hx of migraine headaches    Joint pain    Migraines    Neuromuscular disorder ([place])    diabetic neuropathy   Shoulder pain      Family History  Problem Relation Age of Onset   Rectal cancer  Paternal Grandmother    Diabetes Father    Kidney disease Father    CAD Father    Hyperlipidemia Father    Hypertension Father    Heart disease Father    Sudden death Father    Cancer Mother    Cancer Paternal Uncle    Cancer Paternal Aunt      Current Outpatient Medications:    acetaminophen (TYLENOL) 650 MG CR tablet, Take 650 mg by mouth every 8 (eight) hours as needed for pain., Disp: , Rfl:    APPLE CIDER VINEGAR PO, Take by mouth., Disp: , Rfl:    Continuous Blood Gluc Receiver (FREESTYLE LIBRE 14 DAY READER) DEVI, USE AS DIRECTED TO CHECK BLOOD SUGARS FOUR TIMES A DAY, Disp: 3 each, Rfl: 0   Continuous Blood Gluc Sensor (FREESTYLE LIBRE 2 SENSOR) MISC, USE AS DIRECTED TO CHECK BLOOD SUGARS FOUR TIMES A DAY, Disp: 3 each, Rfl: 1   Continuous Blood Gluc Sensor (FREESTYLE LIBRE 2 SENSOR) MISC, USE AS DIRECTED TO CHECK BLOOD SUGARS FOUR TIMES A DAY, Disp: 3 each, Rfl: 1   dapagliflozin propanediol (FARXIGA) 10 MG TABS tablet, TAKE 1 TABLET (10 MG TOTAL) BY MOUTH DAILY., Disp: 90 tablet, Rfl: 2   diclofenac Sodium (VOLTAREN) 1 % GEL, Apply topically as needed., Disp: , Rfl:    doxycycline (VIBRAMYCIN) 100 MG capsule, Take 1 capsule (100 mg total) by mouth 2 (two) times daily., Disp: 20 capsule, Rfl: 0   etodolac (LODINE) 400  MG tablet, TAKE 1 TABLET BY MOUTH 2 TIMES DAILY WITH MEALS, Disp: 28 tablet, Rfl: 0   folic acid (FOLVITE) 1 MG tablet, Take 1 tablet (1 mg total) by mouth daily., Disp: 90 tablet, Rfl: 1   folic acid (FOLVITE) 1 MG tablet, TAKE 1 TABLET (1 MG TOTAL) BY MOUTH DAILY., Disp: 90 tablet, Rfl: 1   ibuprofen (ADVIL,MOTRIN) 800 MG tablet, Take 800 mg by mouth every 8 (eight) hours as needed. For pain, Disp: , Rfl:    insulin degludec (TRESIBA) 200 UNIT/ML FlexTouch Pen, Inject 30 units into the skin at bedtime. Max of 80 units daily., Disp: 9 mL, Rfl: 3   Iron-FA-B Cmp-C-Biot-Probiotic (FUSION PLUS) CAPS, Take 1 capsule by mouth daily. 1 capsule daily, Disp: 90 capsule,  Rfl: 1   Iron-FA-B Cmp-C-Biot-Probiotic (FUSION PLUS) CAPS, TAKE 1 CAPSULE BY MOUTH ONCE DAILY, Disp: 90 capsule, Rfl: 1   linaclotide (LINZESS) 145 MCG CAPS capsule, Take 145 mcg by mouth daily before breakfast., Disp: , Rfl:    meloxicam (MOBIC) 7.5 MG tablet, One tab po qd prn, Disp: 30 tablet, Rfl: 0   metFORMIN (GLUCOPHAGE) 1000 MG tablet, Take 1 tablet (1,000 mg total) by mouth 2 (two) times daily in the morning and evening with meals., Disp: 180 tablet, Rfl: 0   metFORMIN (GLUCOPHAGE) 1000 MG tablet, Take 1 tablet (1,000 mg total) by mouth 2 (two) times daily in the morning and evening with meals., Disp: 180 tablet, Rfl: 1   Multiple Vitamin (MULITIVITAMIN WITH MINERALS) TABS, Take 1 tablet by mouth daily., Disp: , Rfl:    pregabalin (LYRICA) 150 MG capsule, One capsules po tid, Disp: 270 capsule, Rfl: 2   pregabalin (LYRICA) 150 MG capsule, TAKE 1 CAPSULE BY MOUTH THREE TIMES DAILY., Disp: 270 capsule, Rfl: 2   Semaglutide, 1 MG/DOSE, (OZEMPIC, 1 MG/DOSE,) 2 MG/1.5ML SOPN, Inject 1 mg into the skin once a week., Disp: 1.5 mL, Rfl: 5   SUMAtriptan (IMITREX) 50 MG tablet, Take 1 tablet (50 mg total) by mouth every 2 (two) hours as needed., Disp: 10 tablet, Rfl: 2   traMADol (ULTRAM) 50 MG tablet, Take 1 tablet (50 mg total) by mouth every 6 (six) hours as needed., Disp: 30 tablet, Rfl: 0   UNIFINE PENTIPS PLUS 31G X 5 MM MISC, Use as directed with Ozempic pen, Disp: 90 each, Rfl: 2   valACYclovir (VALTREX) 500 MG tablet, Take 500 mg by mouth 3 (three) times daily., Disp: , Rfl: 0   Allergies  Allergen Reactions   Latex Hives   Peanut-Containing Drug Products    Shrimp [Shellfish Allergy]      Review of Systems  Constitutional: Negative.   Respiratory: Negative.    Cardiovascular: Negative.   Gastrointestinal: Negative.        She c/o abdominal bloating. Denies abd. Pain. Denies n/v/d.   Psychiatric/Behavioral: Negative.    All other systems reviewed and are negative.   Today's  Vitals   10/27/20 1558  BP: 124/82  Pulse: 77  Temp: 98 F (36.7 C)  TempSrc: Oral  Weight: 176 lb 3.2 oz (79.9 kg)  Height: 5' 0.8" (1.544 m)   Body mass index is 33.51 kg/m.  Wt Readings from Last 3 Encounters:  10/27/20 176 lb 3.2 oz (79.9 kg)  07/27/20 184 lb (83.5 kg)  04/19/20 188 lb (85.3 kg)    BP Readings from Last 3 Encounters:  10/27/20 124/82  07/27/20 132/88  07/20/20 122/81    Objective:  Physical Exam Vitals and nursing  note reviewed.  Constitutional:      Appearance: Normal appearance. She is obese.  HENT:     Head: Normocephalic and atraumatic.     Nose:     Comments: Masked     Mouth/Throat:     Comments: Masked  Cardiovascular:     Rate and Rhythm: Normal rate and regular rhythm.     Heart sounds: Normal heart sounds.  Pulmonary:     Effort: Pulmonary effort is normal.     Breath sounds: Normal breath sounds.  Abdominal:     General: Bowel sounds are normal.     Palpations: Abdomen is soft.     Comments: Obese, difficult to assess organomegaly.   Skin:    General: Skin is warm.  Neurological:     General: No focal deficit present.     Mental Status: She is alert.  Psychiatric:        Mood and Affect: Mood normal.        Behavior: Behavior normal.        Assessment And Plan:     1. Diabetic mononeuropathy associated with type 2 diabetes mellitus ([place]) Comments: Chronic, I will check labs as listed below. Importance of dietary/medication compliance was d/w pt.  - Hemoglobin A1c - BMP8+EGFR - POCT Urinalysis Dipstick (81002) - POCT UA - Microalbumin - Ambulatory referral to Ophthalmology  2. Other iron deficiency anemia Comments: I will check iron studies as listed below.  - Iron and IBC ([id number]) - Ferritin  3. Leukocytosis, unspecified type Comments: I will check repeat CBC today.  - CBC with Diff  4. Abdominal bloating Comments: Advised to avoid known triggers. May benefit from Fd-Gard if her sx persist.   5. Class  1 obesity due to excess calories with serious comorbidity and body mass index (BMI) of 33.0 to 33.9 in adult  She is encouraged to strive for BMI less than 30 to decrease cardiac risk. Advised to aim for at least 150 minutes of exercise per week.    Patient was given opportunity to ask questions. Patient verbalized understanding of the plan and was able to repeat key elements of the plan. All questions were answered to their satisfaction.    I, [name], MD, have reviewed all documentation for this visit. The documentation on 10/27/20 for the exam, diagnosis, procedures, and orders are all accurate and complete.   IF YOU HAVE BEEN REFERRED TO A SPECIALIST, IT MAY TAKE 1-2 WEEKS TO SCHEDULE/PROCESS THE REFERRAL. IF YOU HAVE NOT HEARD FROM US/SPECIALIST IN TWO WEEKS, PLEASE GIVE [place] A CALL AT [phone] X [phone].   THE PATIENT IS ENCOURAGED TO PRACTICE SOCIAL DISTANCING DUE TO THE COVID-19 PANDEMIC.

## 2020-11-24 ENCOUNTER — Telehealth: Payer: Self-pay

## 2020-11-24 ENCOUNTER — Encounter: Payer: Self-pay | Admitting: Internal Medicine

## 2020-11-24 NOTE — Telephone Encounter (Signed)
The patient was notified that a sample of tresiba is ready for pickup.

## 2020-11-29 IMAGING — MR MR WRIST*R* W/O CM
5 series · 40 of 40 positions shown · non-contrast
Comparison: None.

CLINICAL DATA: Dorsal right wrist pain for 1 year.

EXAM:
MR OF THE RIGHT WRIST WITHOUT CONTRAST
TECHNIQUE: Multiplanar, multisequence MR imaging of the right wrist was
performed. No intravenous contrast was administered.

[Series 3: T1 · axial · right · 4.0mm · 0.21mm/px · z∈[-73,+38]mm · 9 of 25 slices shown (1 of 2)]
[im 1/25]
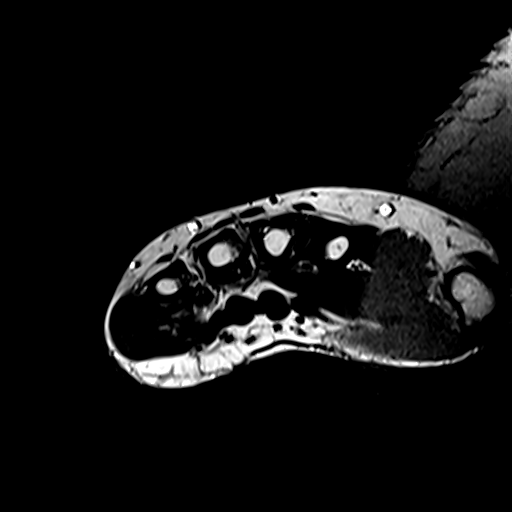
[im 4/25]
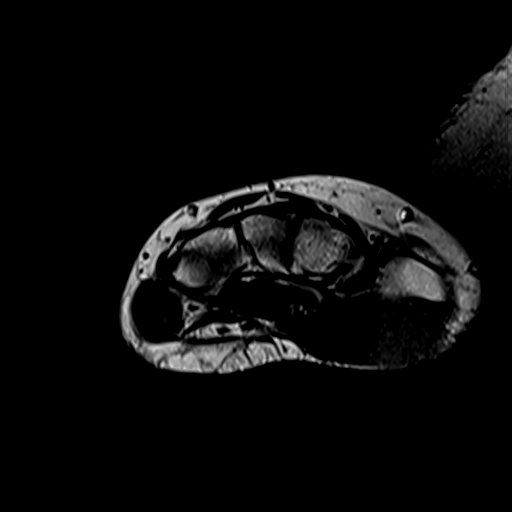
[im 7/25]
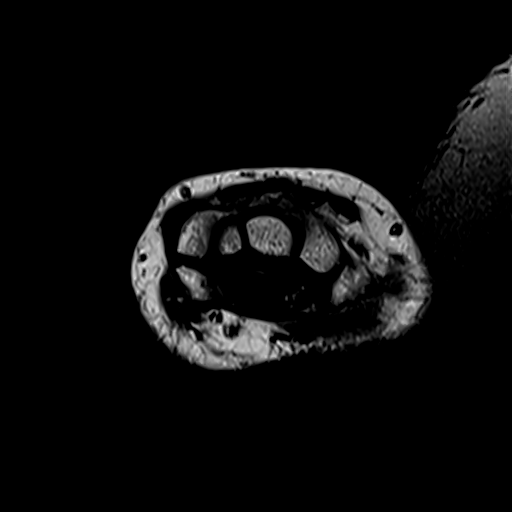
[im 10/25]
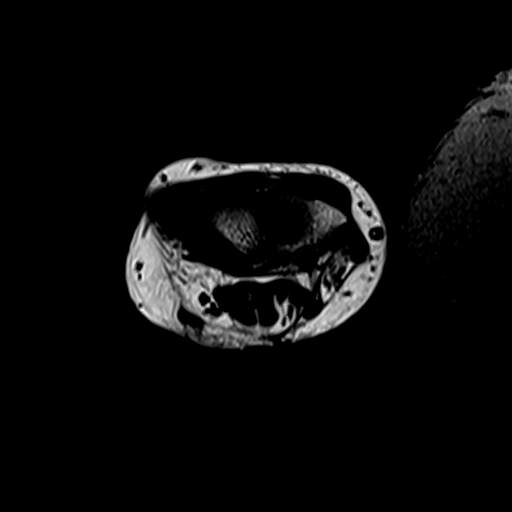
[im 13/25]
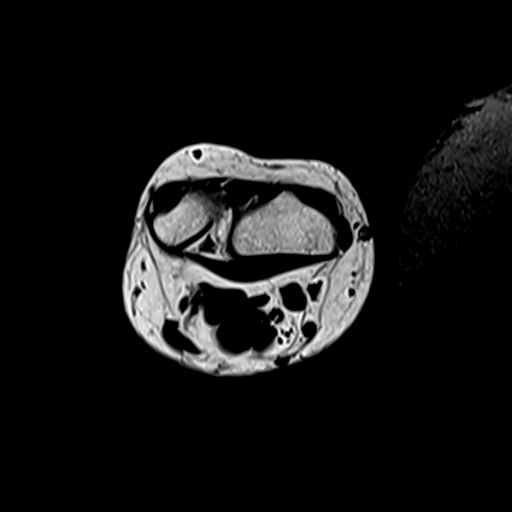
[im 16/25]
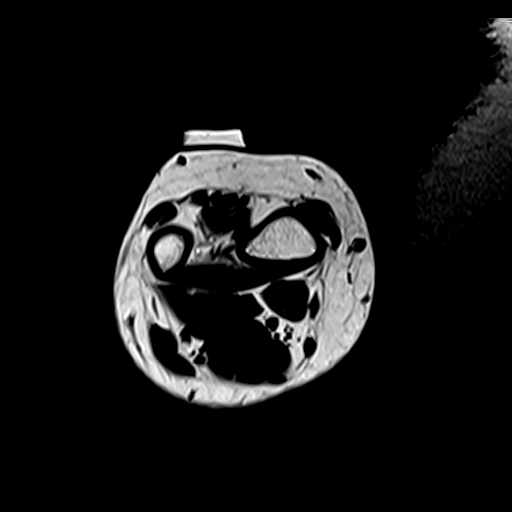
[im 19/25]
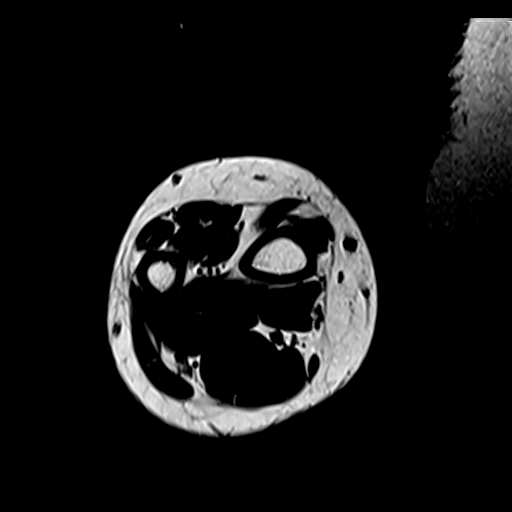
[im 22/25]
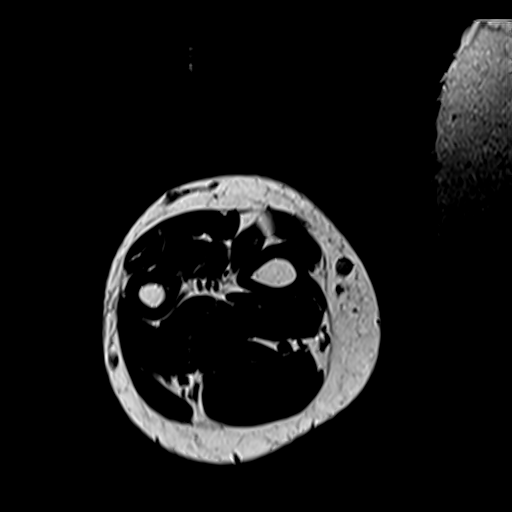
[im 25/25]
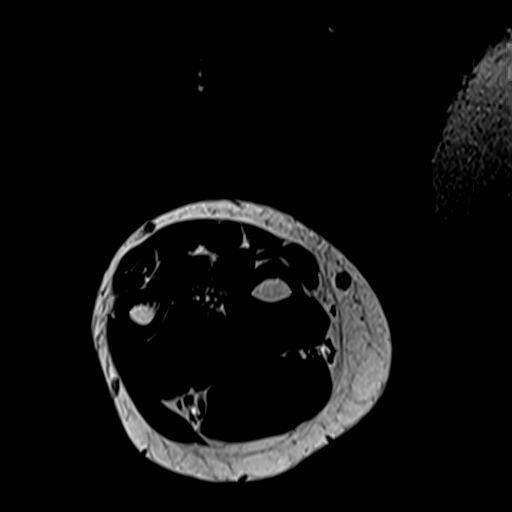

[Series 4: T2 fat-sat · axial · right · 4.0mm · 0.47mm/px · z∈[-74,+38]mm · 9 of 25 slices shown (1 of 2)]
[im 1/25]
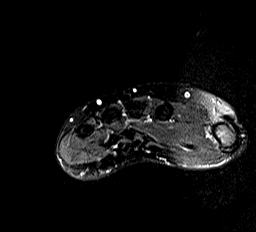
[im 4/25]
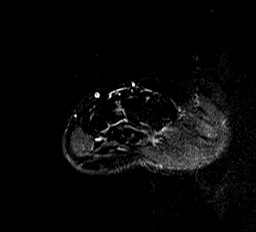
[im 7/25]
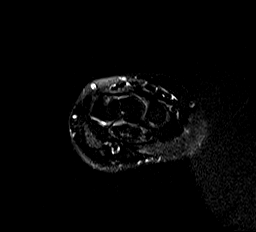
[im 10/25]
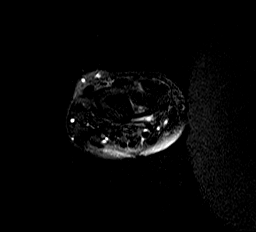
[im 13/25]
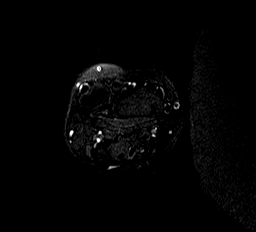
[im 16/25]
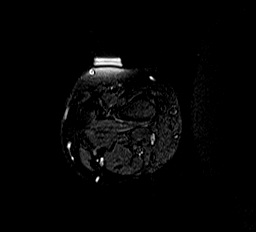
[im 19/25]
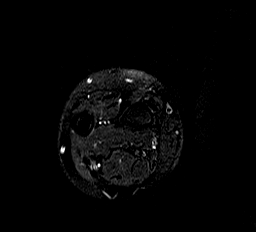
[im 22/25]
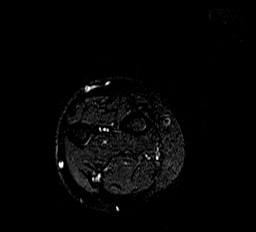
[im 25/25]
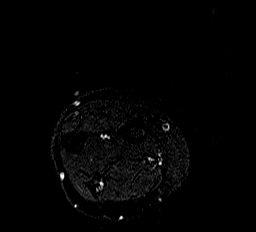

[Series 5: T1 · oblique · right · 3.0mm · 0.47mm/px · 8 of 21 slices shown (2 of 2)]
[im 1/21]
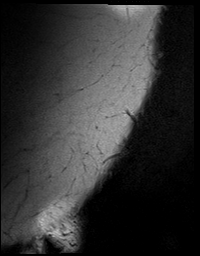
[im 3/21]
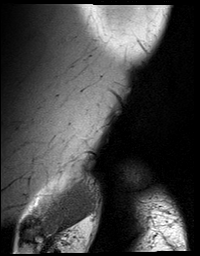
[im 6/21]
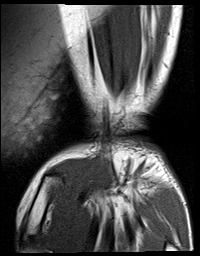
[im 9/21]
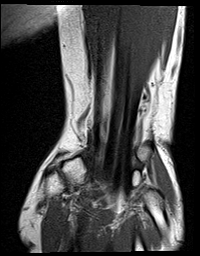
[im 12/21]
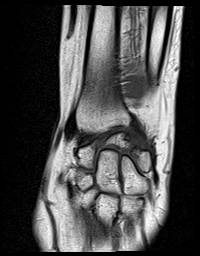
[im 15/21]
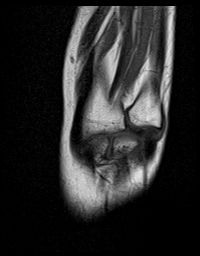
[im 18/21]
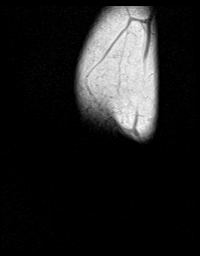
[im 21/21]
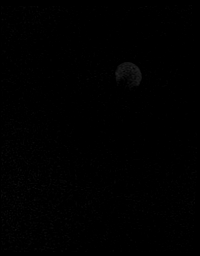

[Series 6: T2 fat-sat · oblique · right · 3.0mm · 0.47mm/px · 7 of 20 slices shown (2 of 2)]
[im 1/20]
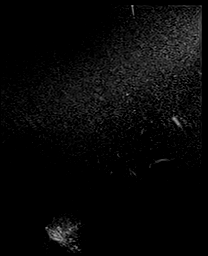
[im 4/20]
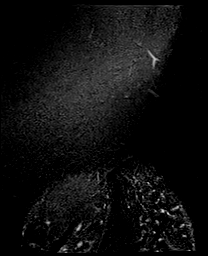
[im 7/20]
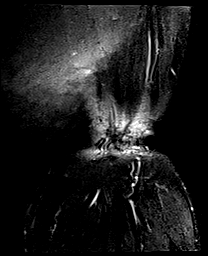
[im 10/20]
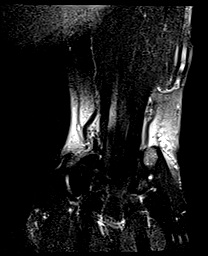
[im 13/20]
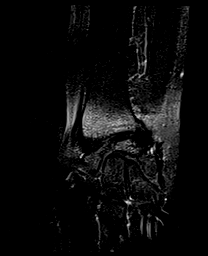
[im 16/20]
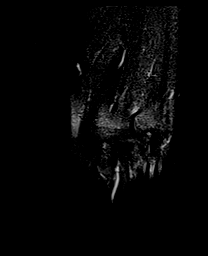
[im 20/20]
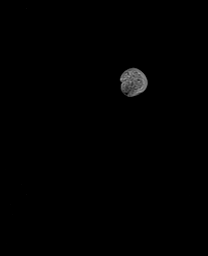

[Series 7: PD fat-sat · oblique · right · 3.0mm · 0.34mm/px · 7 of 19 slices shown]
[im 1/19]
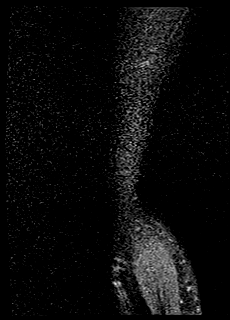
[im 4/19]
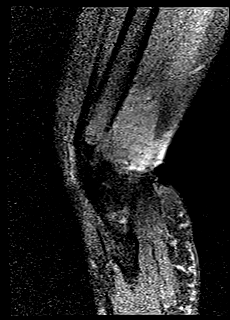
[im 7/19]
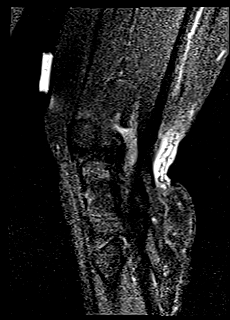
[im 10/19]
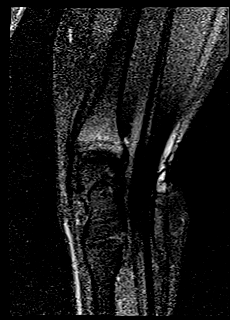
[im 13/19]
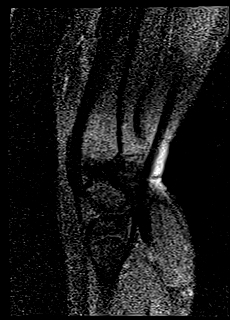
[im 16/19]
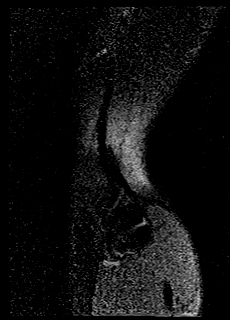
[im 19/19]
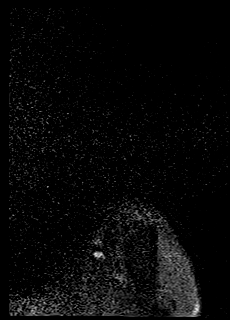

[40 of 40 positions shown; findings below may reference images not displayed]

FINDINGS: Examination was ordered with IV contrast. The patient was unable to
tolerate further scanning and no contrast was given. Based on images
provided, contrast imaging would not assist in interpretation of the
study.

Ligaments: Intact.

Triangular fibrocartilage: Intact.

Tendons: Intact and normal in appearance. No evidence of
tenosynovitis.

Carpal tunnel/median nerve: Normal.

Guyon's canal: Normal.

Joint/cartilage: Normal.

Bones/carpal alignment: Normal.

Other: A marker is placed on the region of concern over the dorsum
of the wrist. No underlying fluid collection or mass is identified.
All subcutaneous fatty tissues demonstrate normal T1 signal and
complete signal dropout on fat saturated imaging.
IMPRESSION: Normal examination. No mass or fluid collection is seen in the
region of concern. It is possible the patient has an unencapsulated
simple lipoma. No further imaging is indicated based on this study.

## 2020-12-30 ENCOUNTER — Encounter: Payer: Self-pay | Admitting: Internal Medicine

## 2021-01-06 ENCOUNTER — Telehealth: Payer: Self-pay

## 2021-01-06 NOTE — Telephone Encounter (Signed)
The pt was notified that her mychart message was received and that the office isn't open today.

## 2021-01-10 LAB — HM DIABETES EYE EXAM

## 2021-01-11 ENCOUNTER — Other Ambulatory Visit (HOSPITAL_COMMUNITY): Payer: Self-pay

## 2021-01-11 ENCOUNTER — Ambulatory Visit
Admission: EM | Admit: 2021-01-11 | Discharge: 2021-01-11 | Disposition: A | Discharge status: Home / Self Care | Payer: Self-pay | Attending: Emergency Medicine | Admitting: Emergency Medicine

## 2021-01-11 ENCOUNTER — Encounter: Payer: Self-pay | Admitting: Emergency Medicine

## 2021-01-11 ENCOUNTER — Other Ambulatory Visit: Payer: Self-pay

## 2021-01-11 DIAGNOSIS — B3731 Acute candidiasis of vulva and vagina: Secondary | ICD-10-CM

## 2021-01-11 DIAGNOSIS — B373 Candidiasis of vulva and vagina: Secondary | ICD-10-CM

## 2021-01-11 LAB — POCT URINALYSIS DIP (MANUAL ENTRY)
Bilirubin, UA: NEGATIVE
Glucose, UA: 500 mg/dL — AB
Leukocytes, UA: NEGATIVE
Nitrite, UA: NEGATIVE
Protein Ur, POC: NEGATIVE mg/dL
Spec Grav, UA: <=1.005 — AB (ref 1.010–1.025)
Urobilinogen, UA: 0.2 E.U./dL
pH, UA: 5.5 (ref 5.0–8.0)

## 2021-01-11 MED ORDER — FLUCONAZOLE 150 MG PO TABS
150.0000 mg | ORAL_TABLET | Freq: Every day | ORAL | 0 refills | Status: AC
  Filled 2021-01-11: qty 2, 2d supply, fill #0

## 2021-01-11 NOTE — Discharge Instructions (Addendum)
Take your first Diflucan today and your second Diflucan in the next 3 to 5 days if symptoms do not improve.  A vaginal yeast infection is caused by too many yeast cells in the vagina. This is common in women of all ages. Itching, vaginal discharge and irritation, and other symptoms can bother you. But yeast infections don't often cause other health problems.  Some medicines can increase your risk of getting a yeast infection. These include antibiotics, birth control pills, hormones, and steroids. You may also be more likely to get a yeast infection if you are pregnant, have diabetes, douche, or wear tight clothes.  With treatment, most yeast infections get better in 2 to 3 days.  Do not use tampons while using a vaginal cream or suppository. The tampons can absorb the medicine. Use pads instead. Wear loose cotton clothing. Do not wear nylon or other fabric that holds body heat and moisture close to the skin. Try sleeping without underwear. Do not scratch. Relieve itching with a cold pack or a cool bath. Do not wash your vaginal area more than once a day. Use plain water or a mild, unscented soap. Air-dry the vaginal area. Change out of wet swimsuits after swimming. Do not have sex until you have finished your treatment. Do not douche  Return to clinic if you have unexpected vaginal bleeding or if there is new or increased pain in your pelvis. Follow-up with your gynecologist if you have symptoms that return after treatment or if you have recurrent infections.

## 2021-01-11 NOTE — ED Triage Notes (Signed)
Pt here with burning on urination, frequency and subjective hematuria. Pt states she got a steroid injection last week and has also not been able to get her insulin for DM due to insurance barriers.

## 2021-01-11 NOTE — ED Provider Notes (Signed)
Subjective:    Michelle Tanner is a very pleasant 50 y.o. female who presents with concerns for UTI due to burning with urination, frequency and subjective hematuria.  Patient reports that she received a steroid injection last week and has been out of her insulin.  Patient reports that her blood glucose levels have been quite high at home, but states that she is able to obtain her insulin today. No unilateral back pain, vomiting, fever, vaginal discharge, concern for STD.  Past medical history, past surgical history, current medications reviewed.  Allergies: is allergic to latex, peanut-containing drug products, and shrimp [shellfish allergy].  Review of Systems See HPI   Objective:     Vitals:   01/11/21 1444  BP: 123/87  Pulse: (!) 111  Resp: 20  Temp: 98.3 F (36.8 C)  SpO2: 100%     General: Appears well-developed and well-nourished. No acute distress.  Cardiovascular: Normal rate Pulm/Chest: No respiratory distress Neurological: Alert and oriented to person, place, and time.  Skin: Skin is warm and dry.  Psychiatric: Normal mood, affect, behavior, and thought content.  GU:  Deferred  Laboratory:  Orders Placed This Encounter  Procedures   POCT urinalysis dipstick   Results for orders placed or performed during the hospital encounter of 01/11/21  POCT urinalysis dipstick  Result Value Ref Range   Color, UA yellow yellow   Clarity, UA clear clear   Glucose, UA =500 (A) negative mg/dL   Bilirubin, UA negative negative   Ketones, POC UA small (15) (A) negative mg/dL   Spec Grav, UA <=1.005 (A) 1.010 - 1.025   Blood, UA large (A) negative   pH, UA 5.5 5.0 - 8.0   Protein Ur, POC negative negative mg/dL   Urobilinogen, UA 0.2 0.2 or 1.0 E.U./dL   Nitrite, UA Negative Negative   Leukocytes, UA Negative Negative    -UA: 500 glucose, small ketones, large blood, decreased specific gravity. Otherwise normal. Assessment:   1. Vaginal yeast infection -  fluconazole (DIFLUCAN) 150 MG tablet; Take 1 tablet (150 mg total) by mouth daily for 1 day.  Dispense: 2 tablet; Refill: 0  Plan:   MDM: Patient presents with concerns for UTI due to burning with urination, frequency and subjective hematuria.  Patient reports that she received a steroid injection last week and has been out of her insulin.  Patient reports that her blood glucose levels have been quite high at home, but states that she is able to obtain her insulin today.  Reports some itching to the vaginal region.  No unilateral back pain, vomiting, fever, vaginal discharge, concern for STD.  Chart review completed.  Urinalysis reveals 500 glucose, small ketones, large blood, decreased specific gravity, otherwise normal.  Given increased glucose in urine and recent steroid injection, concern for yeast infection given the patient's symptoms as she does state that sometimes she will have itching to the vaginal region.  Rx Diflucan to the patient's preferred pharmacy and advised about home treatment and care as outlined in her AVS to include not wearing wet clothing, sleeping without underwear, use of ice/cool pack as needed to help with itching.  Advised to return to clinic with any unexplained vaginal bleeding, new or worse pain in her pelvic region.  Follow-up with gynecology if she begins to have recurrent infections.  Patient verbalized understanding and agreed with plan.  Patient stable upon discharge.  Return as needed.    Discharge Instructions      Take your first Diflucan today  and your second Diflucan in the next 3 to 5 days if symptoms do not improve.  A vaginal yeast infection is caused by too many yeast cells in the vagina. This is common in women of all ages. Itching, vaginal discharge and irritation, and other symptoms can bother you. But yeast infections don't often cause other health problems.  Some medicines can increase your risk of getting a yeast infection. These include  antibiotics, birth control pills, hormones, and steroids. You may also be more likely to get a yeast infection if you are pregnant, have diabetes, douche, or wear tight clothes.  With treatment, most yeast infections get better in 2 to 3 days.  Do not use tampons while using a vaginal cream or suppository. The tampons can absorb the medicine. Use pads instead. Wear loose cotton clothing. Do not wear nylon or other fabric that holds body heat and moisture close to the skin. Try sleeping without underwear. Do not scratch. Relieve itching with a cold pack or a cool bath. Do not wash your vaginal area more than once a day. Use plain water or a mild, unscented soap. Air-dry the vaginal area. Change out of wet swimsuits after swimming. Do not have sex until you have finished your treatment. Do not douche  Return to clinic if you have unexpected vaginal bleeding or if there is new or increased pain in your pelvis. Follow-up with your gynecologist if you have symptoms that return after treatment or if you have recurrent infections.           Serafina Royals, FNP 01/11/21 1500

## 2021-01-12 ENCOUNTER — Encounter: Payer: Self-pay | Admitting: Internal Medicine

## 2021-01-13 ENCOUNTER — Other Ambulatory Visit (HOSPITAL_COMMUNITY): Payer: Self-pay

## 2021-01-30 ENCOUNTER — Encounter: Payer: Self-pay | Admitting: Internal Medicine

## 2021-01-30 NOTE — Progress Notes (Deleted)
I,Jemiah Cuadra [employer] as a Education administrator for [name], MD.,have documented all relevant documentation on the behalf of [name], MD,as directed by  [name], MD while in the presence of [name], MD.  This visit occurred during the SARS-CoV-2 public health emergency.  Safety protocols were in place, including screening questions prior to the visit, additional usage of staff PPE, and extensive cleaning of exam room while observing appropriate contact time as indicated for disinfecting solutions.  Subjective:     Patient ID: Michelle Tanner , female    DOB: [birth year] , 50 y.o.   MRN: [id number]   No chief complaint on file.   HPI  The patient is here today for a diabetes f/u. She reports compliance with meds.   Diabetes She presents for her follow-up diabetic visit. She has type 2 diabetes mellitus. There are no hypoglycemic associated symptoms. Associated symptoms include foot paresthesias. There are no hypoglycemic complications. Risk factors for coronary artery disease include diabetes mellitus, sedentary lifestyle, stress and obesity. Current diabetic treatment includes insulin injections. She is compliant with treatment most of the time. She is following a generally healthy diet. She never participates in exercise. Her breakfast blood glucose is taken between 8-9 am. Her breakfast blood glucose range is generally 110-130 mg/dl. An ACE inhibitor/angiotensin II receptor blocker is not being taken. She does not see a podiatrist.Eye exam is not current.    Past Medical History:  Diagnosis Date   Back pain    Diabetes mellitus    Edema, lower extremity    Fatty liver    per patient    Fibromyalgia    Frozen shoulder    left and right    Hx of migraine headaches    Joint pain    Migraines    Neuromuscular disorder ([place])    diabetic neuropathy   Shoulder pain      Family History  Problem Relation Age of Onset   Rectal cancer Paternal Grandmother     Diabetes Father    Kidney disease Father    CAD Father    Hyperlipidemia Father    Hypertension Father    Heart disease Father    Sudden death Father    Cancer Mother    Cancer Paternal Uncle    Cancer Paternal Aunt      Current Outpatient Medications:    acetaminophen (TYLENOL) 650 MG CR tablet, Take 650 mg by mouth every 8 (eight) hours as needed for pain., Disp: , Rfl:    APPLE CIDER VINEGAR PO, Take by mouth., Disp: , Rfl:    Continuous Blood Gluc Receiver (FREESTYLE LIBRE 14 DAY READER) DEVI, USE AS DIRECTED TO CHECK BLOOD SUGARS FOUR TIMES A DAY, Disp: 3 each, Rfl: 0   Continuous Blood Gluc Sensor (FREESTYLE LIBRE 2 SENSOR) MISC, USE AS DIRECTED TO CHECK BLOOD SUGARS FOUR TIMES A DAY, Disp: 3 each, Rfl: 1   Continuous Blood Gluc Sensor (FREESTYLE LIBRE 2 SENSOR) MISC, USE AS DIRECTED TO CHECK BLOOD SUGARS FOUR TIMES A DAY, Disp: 3 each, Rfl: 1   dapagliflozin propanediol (FARXIGA) 10 MG TABS tablet, TAKE 1 TABLET (10 MG TOTAL) BY MOUTH DAILY., Disp: 90 tablet, Rfl: 2   diclofenac Sodium (VOLTAREN) 1 % GEL, Apply topically as needed., Disp: , Rfl:    doxycycline (VIBRAMYCIN) 100 MG capsule, Take 1 capsule (100 mg total) by mouth 2 (two) times daily., Disp: 20 capsule, Rfl: 0   etodolac (LODINE) 400 MG tablet, TAKE 1  TABLET BY MOUTH 2 TIMES DAILY WITH MEALS, Disp: 28 tablet, Rfl: 0   folic acid (FOLVITE) 1 MG tablet, Take 1 tablet (1 mg total) by mouth daily., Disp: 90 tablet, Rfl: 1   folic acid (FOLVITE) 1 MG tablet, TAKE 1 TABLET (1 MG TOTAL) BY MOUTH DAILY., Disp: 90 tablet, Rfl: 1   ibuprofen (ADVIL,MOTRIN) 800 MG tablet, Take 800 mg by mouth every 8 (eight) hours as needed. For pain, Disp: , Rfl:    insulin degludec (TRESIBA) 200 UNIT/ML FlexTouch Pen, Inject 30 units into the skin at bedtime. Max of 80 units daily., Disp: 9 mL, Rfl: 3   Iron-FA-B Cmp-C-Biot-Probiotic (FUSION PLUS) CAPS, Take 1 capsule by mouth daily. 1 capsule daily, Disp: 90 capsule, Rfl: 1   Iron-FA-B  Cmp-C-Biot-Probiotic (FUSION PLUS) CAPS, TAKE 1 CAPSULE BY MOUTH ONCE DAILY, Disp: 90 capsule, Rfl: 1   linaclotide (LINZESS) 145 MCG CAPS capsule, Take 145 mcg by mouth daily before breakfast., Disp: , Rfl:    meloxicam (MOBIC) 7.5 MG tablet, One tab po qd prn, Disp: 30 tablet, Rfl: 0   metFORMIN (GLUCOPHAGE) 1000 MG tablet, Take 1 tablet (1,000 mg total) by mouth 2 (two) times daily in the morning and evening with meals., Disp: 180 tablet, Rfl: 0   metFORMIN (GLUCOPHAGE) 1000 MG tablet, Take 1 tablet (1,000 mg total) by mouth 2 (two) times daily in the morning and evening with meals., Disp: 180 tablet, Rfl: 1   Multiple Vitamin (MULITIVITAMIN WITH MINERALS) TABS, Take 1 tablet by mouth daily., Disp: , Rfl:    pregabalin (LYRICA) 150 MG capsule, One capsules po tid, Disp: 270 capsule, Rfl: 2   pregabalin (LYRICA) 150 MG capsule, TAKE 1 CAPSULE BY MOUTH THREE TIMES DAILY., Disp: 270 capsule, Rfl: 2   Semaglutide, 1 MG/DOSE, (OZEMPIC, 1 MG/DOSE,) 2 MG/1.5ML SOPN, Inject 1 mg into the skin once a week., Disp: 1.5 mL, Rfl: 5   SUMAtriptan (IMITREX) 50 MG tablet, Take 1 tablet (50 mg total) by mouth every 2 (two) hours as needed., Disp: 10 tablet, Rfl: 2   traMADol (ULTRAM) 50 MG tablet, Take 1 tablet (50 mg total) by mouth every 6 (six) hours as needed., Disp: 30 tablet, Rfl: 0   UNIFINE PENTIPS PLUS 31G X 5 MM MISC, Use as directed with Ozempic pen, Disp: 90 each, Rfl: 2   valACYclovir (VALTREX) 500 MG tablet, Take 500 mg by mouth 3 (three) times daily., Disp: , Rfl: 0   Allergies  Allergen Reactions   Latex Hives   Peanut-Containing Drug Products    Shrimp [Shellfish Allergy]      Review of Systems   There were no vitals filed for this visit. There is no height or weight on file to calculate BMI.   Objective:  Physical Exam      Assessment And Plan:     There are no diagnoses linked to this encounter.    Patient was given opportunity to ask questions. Patient verbalized  understanding of the plan and was able to repeat key elements of the plan. All questions were answered to their satisfaction.  [address] [place], CMA   I, [place], [place], have reviewed all documentation for this visit. The documentation on 01/30/21 for the exam, diagnosis, procedures, and orders are all accurate and complete.   IF YOU HAVE BEEN REFERRED TO A SPECIALIST, IT MAY TAKE 1-2 WEEKS TO SCHEDULE/PROCESS THE REFERRAL. IF YOU HAVE NOT HEARD FROM US/SPECIALIST IN TWO WEEKS, PLEASE GIVE [place] A CALL AT  [phone] X [phone].   THE PATIENT IS ENCOURAGED TO PRACTICE SOCIAL DISTANCING DUE TO THE COVID-19 PANDEMIC.    

## 2021-02-05 NOTE — Progress Notes (Signed)
Erroneous - no show

## 2021-02-21 ENCOUNTER — Encounter: Payer: Self-pay | Admitting: Internal Medicine

## 2021-03-28 ENCOUNTER — Encounter: Payer: Self-pay | Admitting: Internal Medicine

## 2021-04-04 ENCOUNTER — Other Ambulatory Visit: Payer: Self-pay

## 2021-04-04 DIAGNOSIS — D72829 Elevated white blood cell count, unspecified: Secondary | ICD-10-CM

## 2021-04-04 DIAGNOSIS — D508 Other iron deficiency anemias: Secondary | ICD-10-CM

## 2021-04-04 DIAGNOSIS — E1141 Type 2 diabetes mellitus with diabetic mononeuropathy: Secondary | ICD-10-CM

## 2021-04-05 LAB — CBC WITH DIFFERENTIAL/PLATELET
Basophils Absolute: 0.1 10*3/uL (ref 0.0–0.2)
Basos: 1 %
EOS (ABSOLUTE): 0.3 10*3/uL (ref 0.0–0.4)
Eos: 2 %
Hematocrit: 43.3 % (ref 34.0–46.6)
Hemoglobin: 14 g/dL (ref 11.1–15.9)
Immature Grans (Abs): 0 10*3/uL (ref 0.0–0.1)
Immature Granulocytes: 0 %
Lymphocytes Absolute: 2.6 10*3/uL (ref 0.7–3.1)
Lymphs: 24 %
MCH: 26.5 pg — ABNORMAL LOW (ref 26.6–33.0)
MCHC: 32.3 g/dL (ref 31.5–35.7)
MCV: 82 fL (ref 79–97)
Monocytes Absolute: 0.6 10*3/uL (ref 0.1–0.9)
Monocytes: 5 %
Neutrophils Absolute: 7.3 10*3/uL — ABNORMAL HIGH (ref 1.4–7.0)
Neutrophils: 68 %
Platelets: 253 10*3/uL (ref 150–450)
RBC: 5.28 x10E6/uL (ref 3.77–5.28)
RDW: 11.8 % (ref 11.7–15.4)
WBC: 10.9 10*3/uL — ABNORMAL HIGH (ref 3.4–10.8)

## 2021-04-05 LAB — BMP8+EGFR
BUN/Creatinine Ratio: 9 (ref 9–23)
BUN: 5 mg/dL — ABNORMAL LOW (ref 6–24)
CO2: 21 mmol/L (ref 20–29)
Calcium: 9.6 mg/dL (ref 8.7–10.2)
Chloride: 100 mmol/L (ref 96–106)
Creatinine, Ser: 0.54 mg/dL — ABNORMAL LOW (ref 0.57–1.00)
Glucose: 91 mg/dL (ref 70–99)
Potassium: 4.7 mmol/L (ref 3.5–5.2)
Sodium: 138 mmol/L (ref 134–144)
eGFR: 112 mL/min/{1.73_m2} (ref >59)

## 2021-04-05 LAB — IRON AND TIBC
Iron Saturation: 21 % (ref 15–55)
Iron: 54 ug/dL (ref 27–159)
Total Iron Binding Capacity: 263 ug/dL (ref 250–450)
UIBC: 209 ug/dL (ref 131–425)

## 2021-04-05 LAB — HEMOGLOBIN A1C
Est. average glucose Bld gHb Est-mCnc: 286 mg/dL
Hgb A1c MFr Bld: 11.6 % — ABNORMAL HIGH (ref 4.8–5.6)

## 2021-04-05 LAB — FERRITIN: Ferritin: 663 ng/mL — ABNORMAL HIGH (ref 15–150)

## 2021-05-01 ENCOUNTER — Encounter: Payer: 59 | Admitting: Internal Medicine

## 2021-05-10 ENCOUNTER — Encounter: Payer: Self-pay | Admitting: Internal Medicine

## 2021-05-17 ENCOUNTER — Other Ambulatory Visit: Payer: Self-pay | Admitting: Internal Medicine

## 2021-05-23 ENCOUNTER — Encounter: Payer: Self-pay | Admitting: Internal Medicine

## 2021-05-24 ENCOUNTER — Encounter: Payer: Self-pay | Admitting: Family

## 2021-05-24 ENCOUNTER — Other Ambulatory Visit: Payer: Self-pay | Admitting: Internal Medicine

## 2021-05-24 ENCOUNTER — Other Ambulatory Visit (HOSPITAL_COMMUNITY): Payer: Self-pay

## 2021-05-24 MED ORDER — PREGABALIN 150 MG PO CAPS
150.0000 mg | ORAL_CAPSULE | Freq: Three times a day (TID) | ORAL | 1 refills | Status: AC
  Filled 2021-05-24 (×2): qty 90, 30d supply, fill #0

## 2021-06-06 ENCOUNTER — Other Ambulatory Visit: Payer: Self-pay

## 2021-06-06 ENCOUNTER — Ambulatory Visit (INDEPENDENT_AMBULATORY_CARE_PROVIDER_SITE_OTHER): Payer: Self-pay | Admitting: Nurse Practitioner

## 2021-06-06 VITALS — BP 136/88 | HR 92 | Temp 98.2°F | Ht 60.8 in | Wt 175.2 lb

## 2021-06-06 DIAGNOSIS — Z6833 Body mass index (BMI) 33.0-33.9, adult: Secondary | ICD-10-CM

## 2021-06-06 DIAGNOSIS — E6609 Other obesity due to excess calories: Secondary | ICD-10-CM

## 2021-06-06 DIAGNOSIS — E1141 Type 2 diabetes mellitus with diabetic mononeuropathy: Secondary | ICD-10-CM

## 2021-06-06 NOTE — Patient Instructions (Signed)

## 2021-06-06 NOTE — Progress Notes (Signed)
I,[name],acting as a Education administrator for [employer], NP.,have documented all relevant documentation on the behalf of [employer], NP,as directed by  Bary Castilla, NP while in the presence of Bary Castilla, NP.  This visit occurred during the SARS-CoV-2 public health emergency.  Safety protocols were in place, including screening questions prior to the visit, additional usage of staff PPE, and extensive cleaning of exam room while observing appropriate contact time as indicated for disinfecting solutions.  Subjective:     Patient ID: Michelle Tanner , female    DOB: [birth year] , 51 y.o.   MRN: [id number]   Chief Complaint  Patient presents with   Diabetes    HPI  The patient is here today for a diabetes f/u. She is complaint on her medication.  Diet: She is not on a diet  Exercise: She is exercising. But then she stops  Wt Readings from Last 3 Encounters: 06/06/21 : 175 lb 3.2 oz (79.5 kg) 10/27/20 : 176 lb 3.2 oz (79.9 kg) 07/27/20 : 184 lb (83.5 kg)    Diabetes She presents for her follow-up diabetic visit. She has type 2 diabetes mellitus. There are no hypoglycemic associated symptoms. Pertinent negatives for hypoglycemia include no headaches. Associated symptoms include foot paresthesias. Pertinent negatives for diabetes include no chest pain, no polydipsia, no polyphagia and no polyuria. There are no hypoglycemic complications. Risk factors for coronary artery disease include diabetes mellitus, sedentary lifestyle, stress and obesity. Current diabetic treatment includes insulin injections. She is compliant with treatment most of the time. She is following a generally healthy diet. She never participates in exercise. Her breakfast blood glucose is taken between 8-9 am. Her breakfast blood glucose range is generally 110-130 mg/dl. An ACE inhibitor/angiotensin II receptor blocker is not being taken. She does not see a podiatrist.Eye exam is not current.    Past  Medical History:  Diagnosis Date   Back pain    Diabetes mellitus    Edema, lower extremity    Fatty liver    per patient    Fibromyalgia    Frozen shoulder    left and right    Hx of migraine headaches    Joint pain    Migraines    Neuromuscular disorder ([place])    diabetic neuropathy   Shoulder pain      Family History  Problem Relation Age of Onset   Rectal cancer Paternal Grandmother    Diabetes Father    Kidney disease Father    CAD Father    Hyperlipidemia Father    Hypertension Father    Heart disease Father    Sudden death Father    Cancer Mother    Cancer Paternal Uncle    Cancer Paternal Aunt      Current Outpatient Medications:    acetaminophen (TYLENOL) 650 MG CR tablet, Take 650 mg by mouth every 8 (eight) hours as needed for pain., Disp: , Rfl:    APPLE CIDER VINEGAR PO, Take by mouth., Disp: , Rfl:    Continuous Blood Gluc Receiver (FREESTYLE LIBRE 14 DAY READER) DEVI, USE AS DIRECTED TO CHECK BLOOD SUGARS FOUR TIMES A DAY, Disp: 3 each, Rfl: 0   Continuous Blood Gluc Sensor (FREESTYLE LIBRE 2 SENSOR) MISC, USE AS DIRECTED TO CHECK BLOOD SUGARS FOUR TIMES A DAY, Disp: 3 each, Rfl: 1   dapagliflozin propanediol (FARXIGA) 10 MG TABS tablet, TAKE 1 TABLET (10 MG TOTAL) BY MOUTH DAILY., Disp: 90 tablet, Rfl: 2   diclofenac Sodium (VOLTAREN) 1 %  GEL, Apply topically as needed., Disp: , Rfl:    doxycycline (VIBRAMYCIN) 100 MG capsule, Take 1 capsule (100 mg total) by mouth 2 (two) times daily., Disp: 20 capsule, Rfl: 0   folic acid (FOLVITE) 1 MG tablet, Take 1 tablet (1 mg total) by mouth daily., Disp: 90 tablet, Rfl: 1   ibuprofen (ADVIL,MOTRIN) 800 MG tablet, Take 800 mg by mouth every 8 (eight) hours as needed. For pain, Disp: , Rfl:    insulin degludec (TRESIBA) 200 UNIT/ML FlexTouch Pen, Inject 30 units into the skin at bedtime. Max of 80 units daily., Disp: 9 mL, Rfl: 3   Iron-FA-B Cmp-C-Biot-Probiotic (FUSION PLUS) CAPS, TAKE 1 CAPSULE BY MOUTH ONCE DAILY,  Disp: 90 capsule, Rfl: 1   linaclotide (LINZESS) 145 MCG CAPS capsule, Take 145 mcg by mouth daily before breakfast., Disp: , Rfl:    meloxicam (MOBIC) 7.5 MG tablet, One tab po qd prn, Disp: 30 tablet, Rfl: 0   metFORMIN (GLUCOPHAGE) 1000 MG tablet, Take 1 tablet (1,000 mg total) by mouth 2 (two) times daily in the morning and evening with meals., Disp: 180 tablet, Rfl: 1   Multiple Vitamin (MULITIVITAMIN WITH MINERALS) TABS, Take 1 tablet by mouth daily., Disp: , Rfl:    pregabalin (LYRICA) 150 MG capsule, Take 1 capsule (150 mg total) by mouth 3 (three) times daily., Disp: 90 capsule, Rfl: 1   Semaglutide, 1 MG/DOSE, (OZEMPIC, 1 MG/DOSE,) 2 MG/1.5ML SOPN, Inject 1 mg into the skin once a week., Disp: 1.5 mL, Rfl: 5   SUMAtriptan (IMITREX) 50 MG tablet, Take 1 tablet (50 mg total) by mouth every 2 (two) hours as needed., Disp: 10 tablet, Rfl: 2   UNIFINE PENTIPS PLUS 31G X 5 MM MISC, Use as directed with Ozempic pen, Disp: 90 each, Rfl: 2   valACYclovir (VALTREX) 500 MG tablet, Take 500 mg by mouth 3 (three) times daily., Disp: , Rfl: 0   Allergies  Allergen Reactions   Latex Hives   Peanut-Containing Drug Products    Shrimp [Shellfish Allergy]      Review of Systems  Constitutional:  Negative for chills and fever.  Respiratory:  Negative for cough, shortness of breath and wheezing.   Cardiovascular:  Negative for chest pain and palpitations.  Gastrointestinal:  Negative for diarrhea and nausea.  Endocrine: Negative for polydipsia, polyphagia and polyuria.  Neurological:  Negative for headaches.    Today's Vitals   06/06/21 1058  BP: 136/88  Pulse: 92  Temp: 98.2 F (36.8 C)  Weight: 175 lb 3.2 oz (79.5 kg)  Height: 5' 0.8" (1.544 m)   Body mass index is 33.32 kg/m.  Wt Readings from Last 3 Encounters:  06/06/21 175 lb 3.2 oz (79.5 kg)  10/27/20 176 lb 3.2 oz (79.9 kg)  07/27/20 184 lb (83.5 kg)   [place] Objective:  Physical Exam Constitutional:      Appearance: Normal  appearance. She is obese.  HENT:     Head: Normocephalic and atraumatic.  Cardiovascular:     Rate and Rhythm: Normal rate and regular rhythm.     Pulses: Normal pulses.     Heart sounds: Normal heart sounds. No murmur heard. Pulmonary:     Effort: Pulmonary effort is normal.     Breath sounds: Normal breath sounds.  Skin:    General: Skin is warm and dry.     Capillary Refill: Capillary refill takes less than 2 seconds.  Neurological:     Mental Status: She is alert and oriented to  person, place, and time.        Assessment And Plan:     1. Diabetic mononeuropathy associated with type 2 diabetes mellitus ([place]) -She is complaint her medication however she does not follow a diet.  -Encouraged patient to work on a healthy diet limiting the intake of sugar and sweets and carbs  -Patient is working on getting exercise and eating healthier.  - Hemoglobin A1c - BMP8+EGFR  2. Class 1 obesity due to excess calories with serious comorbidity and body mass index (BMI) of 33.0 to 33.9 in adult Advised patient on a healthy diet including avoiding fast food and red meats. Increase the intake of lean meats including grilled chicken and [place].  Drink a lot of water. Decrease intake of fatty foods. Exercise for 30-45 min. 4-5 a week to decrease the risk of cardiac event.   The patient was encouraged to call or send a message through [place] for any questions or concerns.   Follow up: if symptoms persist or do not get better.   Side effects and appropriate use of all the medication(s) were discussed with the patient today. Patient advised to use the medication(s) as directed by their healthcare provider. The patient was encouraged to read, review, and understand all associated package inserts and contact our office with any questions or concerns. The patient accepts the risks of the treatment plan and had an opportunity to ask questions.   Patient was given opportunity to ask questions. Patient  verbalized understanding of the plan and was able to repeat key elements of the plan. All questions were answered to their satisfaction.  Raman Candid Bovey, DNP   I, Raman Javid Kemler have reviewed all documentation for this visit. The documentation on 06/06/21 for the exam, diagnosis, procedures, and orders are all accurate and complete.    IF YOU HAVE BEEN REFERRED TO A SPECIALIST, IT MAY TAKE 1-2 WEEKS TO SCHEDULE/PROCESS THE REFERRAL. IF YOU HAVE NOT HEARD FROM US/SPECIALIST IN TWO WEEKS, PLEASE GIVE [place] A CALL AT [phone].   THE PATIENT IS ENCOURAGED TO PRACTICE SOCIAL DISTANCING DUE TO THE COVID-19 PANDEMIC.

## 2021-06-07 LAB — BMP8+EGFR
BUN/Creatinine Ratio: 13 (ref 9–23)
BUN: 9 mg/dL (ref 6–24)
CO2: 22 mmol/L (ref 20–29)
Calcium: 9 mg/dL (ref 8.7–10.2)
Chloride: 101 mmol/L (ref 96–106)
Creatinine, Ser: 0.67 mg/dL (ref 0.57–1.00)
Glucose: 386 mg/dL — ABNORMAL HIGH (ref 70–99)
Potassium: 4.2 mmol/L (ref 3.5–5.2)
Sodium: 136 mmol/L (ref 134–144)
eGFR: 106 mL/min/{1.73_m2} (ref >59)

## 2021-06-07 LAB — HEMOGLOBIN A1C
Est. average glucose Bld gHb Est-mCnc: 252 mg/dL
Hgb A1c MFr Bld: 10.4 % — ABNORMAL HIGH (ref 4.8–5.6)

## 2021-06-08 ENCOUNTER — Other Ambulatory Visit (HOSPITAL_COMMUNITY): Payer: Self-pay

## 2021-06-08 ENCOUNTER — Other Ambulatory Visit: Payer: Self-pay | Admitting: Nurse Practitioner

## 2021-06-08 ENCOUNTER — Encounter: Payer: Self-pay | Admitting: Family

## 2021-06-08 MED ORDER — FLUCONAZOLE 150 MG PO TABS
150.0000 mg | ORAL_TABLET | Freq: Every day | ORAL | 0 refills | Status: DC
  Filled 2021-06-08: qty 1, 1d supply, fill #0

## 2021-06-08 NOTE — Progress Notes (Signed)
Sent!

## 2021-06-16 ENCOUNTER — Other Ambulatory Visit (HOSPITAL_COMMUNITY): Payer: Self-pay

## 2021-06-21 ENCOUNTER — Other Ambulatory Visit: Payer: Self-pay

## 2021-06-21 ENCOUNTER — Encounter: Payer: Self-pay | Admitting: Internal Medicine

## 2021-06-21 MED ORDER — FLUCONAZOLE 150 MG PO TABS: ORAL_TABLET | ORAL | 0 refills | Status: DC

## 2021-07-04 ENCOUNTER — Other Ambulatory Visit (HOSPITAL_COMMUNITY): Payer: Self-pay

## 2021-07-11 ENCOUNTER — Encounter: Payer: Self-pay | Admitting: Family

## 2021-07-13 ENCOUNTER — Other Ambulatory Visit: Payer: Self-pay

## 2021-07-13 ENCOUNTER — Encounter: Payer: Self-pay | Admitting: Internal Medicine

## 2021-07-13 ENCOUNTER — Other Ambulatory Visit (HOSPITAL_COMMUNITY): Payer: Self-pay

## 2021-07-13 ENCOUNTER — Encounter: Payer: Self-pay | Admitting: Family

## 2021-07-13 MED ORDER — INSULIN DEGLUDEC 200 UNIT/ML ~~LOC~~ SOPN: 30.0000 [IU] | PEN_INJECTOR | Freq: Every day | SUBCUTANEOUS | 3 refills | Status: AC

## 2021-07-13 MED ORDER — OZEMPIC (1 MG/DOSE) 2 MG/1.5ML ~~LOC~~ SOPN: 1.0000 mg | PEN_INJECTOR | SUBCUTANEOUS | 5 refills | Status: AC

## 2021-09-04 ENCOUNTER — Encounter: Payer: Self-pay | Admitting: Family

## 2021-10-05 ENCOUNTER — Encounter: Payer: Self-pay | Admitting: Internal Medicine

## 2021-11-28 ENCOUNTER — Other Ambulatory Visit: Payer: Self-pay | Admitting: Internal Medicine

## 2021-11-29 ENCOUNTER — Encounter: Payer: Self-pay | Admitting: Family

## 2021-12-27 ENCOUNTER — Encounter (INDEPENDENT_AMBULATORY_CARE_PROVIDER_SITE_OTHER): Payer: Self-pay

## 2022-01-18 LAB — HM DIABETES EYE EXAM

## 2022-01-25 ENCOUNTER — Encounter: Payer: Self-pay | Admitting: Internal Medicine

## 2022-02-07 DIAGNOSIS — R69 Illness, unspecified: Secondary | ICD-10-CM | POA: Diagnosis not present

## 2022-02-14 ENCOUNTER — Encounter (HOSPITAL_COMMUNITY): Payer: Self-pay | Admitting: Emergency Medicine

## 2022-02-14 ENCOUNTER — Emergency Department (HOSPITAL_COMMUNITY)
Admission: EM | Admit: 2022-02-14 | Discharge: 2022-02-15 | Disposition: A | Discharge status: Home / Self Care | Payer: 59 | Attending: Emergency Medicine | Admitting: Emergency Medicine

## 2022-02-14 ENCOUNTER — Other Ambulatory Visit: Payer: Self-pay

## 2022-02-14 DIAGNOSIS — Z91010 Allergy to peanuts: Secondary | ICD-10-CM | POA: Diagnosis not present

## 2022-02-14 DIAGNOSIS — Z794 Long term (current) use of insulin: Secondary | ICD-10-CM | POA: Diagnosis not present

## 2022-02-14 DIAGNOSIS — R739 Hyperglycemia, unspecified: Secondary | ICD-10-CM

## 2022-02-14 DIAGNOSIS — E1165 Type 2 diabetes mellitus with hyperglycemia: Secondary | ICD-10-CM | POA: Diagnosis not present

## 2022-02-14 DIAGNOSIS — Z91040 Latex allergy status: Secondary | ICD-10-CM | POA: Diagnosis not present

## 2022-02-14 DIAGNOSIS — E860 Dehydration: Secondary | ICD-10-CM | POA: Insufficient documentation

## 2022-02-14 LAB — COMPREHENSIVE METABOLIC PANEL
ALT: 18 U/L (ref 0–44)
AST: 26 U/L (ref 15–41)
Albumin: 3.8 g/dL (ref 3.5–5.0)
Alkaline Phosphatase: 131 U/L — ABNORMAL HIGH (ref 38–126)
Anion gap: 14 (ref 5–15)
BUN: 11 mg/dL (ref 6–20)
CO2: 20 mmol/L — ABNORMAL LOW (ref 22–32)
Calcium: 9.7 mg/dL (ref 8.9–10.3)
Chloride: 96 mmol/L — ABNORMAL LOW (ref 98–111)
Creatinine, Ser: 0.79 mg/dL (ref 0.44–1.00)
GFR, Estimated: >60 mL/min (ref >60)
Glucose, Bld: 597 mg/dL (ref 70–99)
Potassium: 4.5 mmol/L (ref 3.5–5.1)
Sodium: 130 mmol/L — ABNORMAL LOW (ref 135–145)
Total Bilirubin: 0.6 mg/dL (ref 0.3–1.2)
Total Protein: 8.2 g/dL — ABNORMAL HIGH (ref 6.5–8.1)

## 2022-02-14 LAB — CBC
HCT: 47.4 % — ABNORMAL HIGH (ref 36.0–46.0)
Hemoglobin: 14.7 g/dL (ref 12.0–15.0)
MCH: 26.5 pg (ref 26.0–34.0)
MCHC: 31 g/dL (ref 30.0–36.0)
MCV: 85.4 fL (ref 80.0–100.0)
Platelets: 375 10*3/uL (ref 150–400)
RBC: 5.55 MIL/uL — ABNORMAL HIGH (ref 3.87–5.11)
RDW: 12.5 % (ref 11.5–15.5)
WBC: 13.4 10*3/uL — ABNORMAL HIGH (ref 4.0–10.5)
nRBC: 0 % (ref 0.0–0.2)

## 2022-02-14 LAB — URINALYSIS, ROUTINE W REFLEX MICROSCOPIC
Bacteria, UA: NONE SEEN
Bilirubin Urine: NEGATIVE
Glucose, UA: >=500 mg/dL — AB
Hgb urine dipstick: NEGATIVE
Ketones, ur: NEGATIVE mg/dL
Leukocytes,Ua: NEGATIVE
Nitrite: NEGATIVE
Protein, ur: NEGATIVE mg/dL
Specific Gravity, Urine: 1.024 (ref 1.005–1.030)
pH: 7 (ref 5.0–8.0)

## 2022-02-14 LAB — LIPASE, BLOOD: Lipase: 39 U/L (ref 11–51)

## 2022-02-14 LAB — CBG MONITORING, ED: Glucose-Capillary: 565 mg/dL (ref 70–99)

## 2022-02-14 NOTE — ED Triage Notes (Signed)
Patient here with complaints of being hyperglycemic. Patient stated today her CBG read 600 on her glucose monitor. Patient states she has been out of her insulin for 3 weeks due to being out of work. She started to become concerned when bilateral feet and hands became numb- left side started 2 weeks ago and right side started 2 days ago. Patient also notes feeling dizzy and nauseated for about a week now. Patient is a Type II diabetic.

## 2022-02-14 NOTE — ED Provider Triage Note (Signed)
Emergency Medicine Provider Triage Evaluation Note  Michelle Tanner , a 51 y.o. female  was evaluated in triage.  Pt complains of high blood sugar.  Patient lost her job and lost her insurance.  She has been unable to afford her medications.  She has been having increased thirst and urination along with abdominal pain and nausea.  Blood sugar unreadable a high today.  Comes in for further evaluation  Review of Systems  Positive: High blood sugar Negative: Weakness  Physical Exam  BP 133/86 (BP Location: Left Arm)   Pulse 90   Temp 98.1 F (36.7 C) (Oral)   Resp 20   SpO2 99%  Gen:   Awake, no distress   Resp:  Normal effort  MSK:   Moves extremities without difficulty  Other:  Normal mental status  Medical Decision Making  Medically screening exam initiated at 8:43 PM.  Appropriate orders placed.  Delia Chimes was informed that the remainder of the evaluation will be completed by another provider, this initial triage assessment does not replace that evaluation, and the importance of remaining in the ED until their evaluation is complete.  Work-up initiated   Margarita Mail, PA-C 02/14/22 2045

## 2022-02-14 NOTE — ED Notes (Signed)
Patient ambulatory with a steady gait back to room.  Patient reports being hyperglycemic due to being out of meds x 3 weeks.

## 2022-02-15 LAB — CBG MONITORING, ED
Glucose-Capillary: 306 mg/dL — ABNORMAL HIGH (ref 70–99)
Glucose-Capillary: 411 mg/dL — ABNORMAL HIGH (ref 70–99)
Glucose-Capillary: 589 mg/dL (ref 70–99)

## 2022-02-15 MED ORDER — LACTATED RINGERS IV BOLUS
1000.0000 mL | Freq: Once | INTRAVENOUS | Status: AC
  Administered 2022-02-15: 1000 mL via INTRAVENOUS

## 2022-02-15 MED ORDER — FLUCONAZOLE 150 MG PO TABS
150.0000 mg | ORAL_TABLET | Freq: Once | ORAL | Status: AC
  Administered 2022-02-15: 150 mg via ORAL
  Filled 2022-02-15: qty 1

## 2022-02-15 MED ORDER — INSULIN ASPART 100 UNIT/ML IJ SOLN
10.0000 [IU] | Freq: Once | INTRAMUSCULAR | Status: AC
  Administered 2022-02-15: 10 [IU] via SUBCUTANEOUS

## 2022-02-15 MED ORDER — SODIUM CHLORIDE 0.9 % IV BOLUS (SEPSIS)
2000.0000 mL | Freq: Once | INTRAVENOUS | Status: AC
  Administered 2022-02-15: 2000 mL via INTRAVENOUS

## 2022-02-15 NOTE — ED Provider Notes (Signed)
[facility] EMERGENCY DEPARTMENT Provider Note   CSN: [id number] Arrival date & time: 02/14/22  1809     History  Chief Complaint  Patient presents with   Hyperglycemia    Michelle Tanner is a 51 y.o. female.  The history is provided by the patient.     Patient with history of diabetes presents with hyperglycemia.  Patient reports she has been without her medications for several weeks.  She reports losing access to her insurance.  She reports increased thirst and polyuria recently.  She also reports numbness in her hands and her feet which are typical for her hyperglycemia, she has had those symptoms for several weeks  no chest pain  Home Medications Prior to Admission medications   Medication Sig Start Date End Date Taking? Authorizing Provider  acetaminophen (TYLENOL) 650 MG CR tablet Take 650 mg by mouth daily as needed for pain.   Yes [provider]  Cholecalciferol (VITAMIN D3) 50 MCG (2000 UT) TABS Take 4,000 Units by mouth daily.   Yes [provider]  diclofenac Sodium (VOLTAREN) 1 % GEL Apply 2 g topically daily as needed (for shoulder pain).   Yes [provider]  ferrous sulfate 324 (65 Fe) MG TBEC Take 324 mg by mouth daily.   Yes [provider]  ibuprofen (ADVIL,MOTRIN) 800 MG tablet Take 800 mg by mouth every 8 (eight) hours as needed for moderate pain. For pain   Yes [provider]  linaclotide (LINZESS) 145 MCG CAPS capsule Take 145 mcg by mouth daily as needed (for constipation).   Yes [provider]  meloxicam (MOBIC) 7.5 MG tablet One tab po qd prn Patient taking differently: Take 7.5 mg by mouth daily as needed for pain. 10/15/19  Yes Glendale Chard, MD  metFORMIN (GLUCOPHAGE) 1000 MG tablet Take 1 tablet (1,000 mg total) by mouth 2 (two) times daily in the morning and evening with meals. 09/20/20 02/16/23 Yes Glendale Chard, MD  Multiple Vitamin (MULITIVITAMIN WITH MINERALS) TABS Take 1  tablet by mouth daily.   Yes [provider]  pregabalin (LYRICA) 150 MG capsule Take 1 capsule (150 mg total) by mouth 3 (three) times daily. 05/24/21  Yes Glendale Chard, MD  SUMAtriptan (IMITREX) 50 MG tablet Take 1 tablet (50 mg total) by mouth every 2 (two) hours as needed. Patient taking differently: Take 50 mg by mouth every 2 (two) hours as needed for migraine. 10/13/19  Yes Glendale Chard, MD  valACYclovir (VALTREX) 500 MG tablet Take 500 mg by mouth 2 (two) times daily. 07/04/17  Yes [provider]  APPLE CIDER VINEGAR PO Take by mouth. Patient not taking: Reported on 02/15/2022    [provider]  Continuous Blood Gluc Receiver (FREESTYLE LIBRE 14 DAY READER) DEVI USE AS DIRECTED TO CHECK BLOOD SUGARS FOUR TIMES A DAY 09/15/20   Glendale Chard, MD  folic acid (FOLVITE) 1 MG tablet Take 1 tablet (1 mg total) by mouth daily. Patient not taking: Reported on 02/15/2022 03/17/20   Glendale Chard, MD  insulin degludec (TRESIBA) 200 UNIT/ML FlexTouch Pen Inject 30 units into the skin at bedtime. Max of 80 units daily. Patient not taking: Reported on 02/15/2022 07/13/21   Glendale Chard, MD  Semaglutide, 1 MG/DOSE, (OZEMPIC, 1 MG/DOSE,) 2 MG/1.5ML SOPN Inject 1 mg into the skin once a week. Patient not taking: Reported on 02/15/2022 07/13/21   Glendale Chard, MD  UNIFINE PENTIPS PLUS 31G X 5 MM MISC Use as directed with Ozempic  pen 09/15/20   Glendale Chard, MD      Allergies    Latex, Peanut-containing drug products, and Shrimp [shellfish allergy]    Review of Systems   Review of Systems  Constitutional:  Positive for fatigue. Negative for fever.  Cardiovascular:  Negative for chest pain.  Neurological:  Positive for numbness. Negative for weakness.    Physical Exam Updated Vital Signs BP 127/78   Pulse 77   Temp 98 F (36.7 C) (Oral)   Resp 20   SpO2 97%  Physical Exam CONSTITUTIONAL: Well developed/well nourished HEAD: Normocephalic/atraumatic EYES:  EOMI ENMT: Mucous membranes moist NECK: supple no meningeal signs CV: S1/S2 noted, no murmurs/rubs/gallops noted LUNGS: Lungs are clear to auscultation bilaterally, no apparent distress ABDOMEN: soft, nontender NEURO: Pt is awake/alert/appropriate, moves all extremitiesx4.  No facial droop.  No arm or leg drift EXTREMITIES: pulses normal/equal, full ROM Distal pulses equal and intact No deformities to her extremities SKIN: warm, color normal PSYCH: no abnormalities of mood noted, alert and oriented to situation  ED Results / Procedures / Treatments   Labs (all labs ordered are listed, but only abnormal results are displayed) Labs Reviewed  CBC - Abnormal; Notable for the following components:      Result Value   WBC 13.4 (*)    RBC 5.55 (*)    HCT 47.4 (*)    All other components within normal limits  URINALYSIS, ROUTINE W REFLEX MICROSCOPIC - Abnormal; Notable for the following components:   Color, Urine STRAW (*)    Glucose, UA >=500 (*)    All other components within normal limits  COMPREHENSIVE METABOLIC PANEL - Abnormal; Notable for the following components:   Sodium 130 (*)    Chloride 96 (*)    CO2 20 (*)    Glucose, Bld 597 (*)    Total Protein 8.2 (*)    Alkaline Phosphatase 131 (*)    All other components within normal limits  CBG MONITORING, ED - Abnormal; Notable for the following components:   Glucose-Capillary 565 (*)    All other components within normal limits  CBG MONITORING, ED - Abnormal; Notable for the following components:   Glucose-Capillary 589 (*)    All other components within normal limits  CBG MONITORING, ED - Abnormal; Notable for the following components:   Glucose-Capillary 411 (*)    All other components within normal limits  CBG MONITORING, ED - Abnormal; Notable for the following components:   Glucose-Capillary 306 (*)    All other components within normal limits  LIPASE, BLOOD    EKG None  Radiology No results  found.  Procedures Procedures    Medications Ordered in ED Medications  sodium chloride 0.9 % bolus 2,000 mL (0 mLs Intravenous Stopped 02/15/22 0250)  lactated ringers bolus 1,000 mL (1,000 mLs Intravenous New Bag/Given 02/15/22 0347)  insulin aspart (novoLOG) injection 10 Units (10 Units Subcutaneous Given 02/15/22 0256)  fluconazole (DIFLUCAN) tablet 150 mg (150 mg Oral Given 02/15/22 0345)    ED Course/ Medical Decision Making/ A&P Clinical Course as of 02/15/22 0649  Thu Feb 15, 2022  0049 Glucose(!!): 597 Hyperglycemia [DW]  0647 Hyperglycemia is improving.  No signs of DKA Patient's not had any vomiting. [DW]  [phone] Due to finances, she is unable to afford insulin.  I did speak to our pharmacist about this, and is very challenging to get this prescribed in an affordable fashion out of the ER.  She does have follow-up next week at the [facility]  [place] [facility].  We will also place consult to social work and potentially may need to be seen at [facility] for medications [DW]  [phone] Patient is overall stable and appropriate for discharge home [DW]    Clinical Course User Index [DW] Ripley Fraise, MD                           Medical Decision Making Amount and/or Complexity of Data Reviewed Labs: ordered. Decision-making details documented in ED Course.  Risk Prescription drug management.   This patient presents to the ED for concern of hyperglycemia, this involves an extensive number of treatment options, and is a complaint that carries with it a high risk of complications and morbidity.  The differential diagnosis includes but is not limited to diabetic ketoacidosis, hyperglycemic hyperosmolar nonketosis  Comorbidities that complicate the patient evaluation: Patient's presentation is complicated by their history of diabetes  Social Determinants of Health: Patient's lack of insurance and lack of prescription access  increases the complexity of managing their  presentation   Lab Tests: I Ordered, and personally interpreted labs.  The pertinent results include: Hyperglycemia without anion gap   Medicines ordered and prescription drug management: I ordered medication including 3 L IV fluid for hyperglycemia and insulin for dehydration and hyperglycemia Reevaluation of the patient after these medicines showed that the patient    improved   Critical Interventions:  IV fluids and insulin   Reevaluation: After the interventions noted above, I reevaluated the patient and found that they have :improved  Complexity of problems addressed: Patient's presentation is most consistent with  acute presentation with potential threat to life or bodily function  Disposition: After consideration of the diagnostic results and the patient's response to treatment,  I feel that the patent would benefit from discharge   .           Final Clinical Impression(s) / ED Diagnoses Final diagnoses:  Hyperglycemia    Rx / DC Orders ED Discharge Orders     None         Ripley Fraise, MD 02/15/22 [phone]

## 2022-02-17 ENCOUNTER — Telehealth: Payer: Self-pay

## 2022-02-17 NOTE — Telephone Encounter (Signed)
Left confidential message to call RNCM back

## 2022-05-03 ENCOUNTER — Encounter: Payer: Self-pay | Admitting: Family

## 2022-05-05 ENCOUNTER — Encounter: Payer: Self-pay | Admitting: Family

## 2022-06-29 ENCOUNTER — Encounter: Payer: Self-pay | Admitting: Family

## 2022-07-20 ENCOUNTER — Encounter: Payer: Self-pay | Admitting: Family

## 2022-10-11 ENCOUNTER — Encounter: Payer: Self-pay | Admitting: Family

## 2022-10-23 ENCOUNTER — Encounter: Payer: Self-pay | Admitting: Family
